# Patient Record
Sex: Female | Born: 1935 | Race: White | Hispanic: No | Smoking: Former smoker
Health system: Southern US, Community
[De-identification: ages and names within clinical notes are randomized; demographics above are authoritative.]

## PROBLEM LIST (undated history)

## (undated) DIAGNOSIS — I10 Essential (primary) hypertension: Secondary | ICD-10-CM

## (undated) DIAGNOSIS — G459 Transient cerebral ischemic attack, unspecified: Secondary | ICD-10-CM

## (undated) DIAGNOSIS — C9192 Lymphoid leukemia, unspecified, in relapse: Secondary | ICD-10-CM

## (undated) DIAGNOSIS — I4891 Unspecified atrial fibrillation: Secondary | ICD-10-CM

## (undated) DIAGNOSIS — R609 Edema, unspecified: Secondary | ICD-10-CM

## (undated) DIAGNOSIS — D4819 Other specified neoplasm of uncertain behavior of connective and other soft tissue: Secondary | ICD-10-CM

## (undated) DIAGNOSIS — F329 Major depressive disorder, single episode, unspecified: Secondary | ICD-10-CM

## (undated) DIAGNOSIS — E785 Hyperlipidemia, unspecified: Secondary | ICD-10-CM

## (undated) DIAGNOSIS — I517 Cardiomegaly: Secondary | ICD-10-CM

## (undated) DIAGNOSIS — D649 Anemia, unspecified: Secondary | ICD-10-CM

## (undated) DIAGNOSIS — I5032 Chronic diastolic (congestive) heart failure: Secondary | ICD-10-CM

## (undated) DIAGNOSIS — R040 Epistaxis: Secondary | ICD-10-CM

## (undated) DIAGNOSIS — D481 Neoplasm of uncertain behavior of connective and other soft tissue: Secondary | ICD-10-CM

## (undated) DIAGNOSIS — J90 Pleural effusion, not elsewhere classified: Secondary | ICD-10-CM

## (undated) DIAGNOSIS — I509 Heart failure, unspecified: Secondary | ICD-10-CM

## (undated) DIAGNOSIS — I110 Hypertensive heart disease with heart failure: Secondary | ICD-10-CM

## (undated) DIAGNOSIS — J9621 Acute and chronic respiratory failure with hypoxia: Secondary | ICD-10-CM

## (undated) DIAGNOSIS — M109 Gout, unspecified: Secondary | ICD-10-CM

## (undated) DIAGNOSIS — I639 Cerebral infarction, unspecified: Secondary | ICD-10-CM

## (undated) DIAGNOSIS — F39 Unspecified mood [affective] disorder: Secondary | ICD-10-CM

## (undated) HISTORY — DX: Epistaxis: R04.0

## (undated) HISTORY — DX: Chronic diastolic (congestive) heart failure: I50.32

## (undated) HISTORY — DX: Essential (primary) hypertension: I10

## (undated) HISTORY — PX: DILATION AND CURETTAGE OF UTERUS: SHX78

## (undated) HISTORY — DX: Lymphoid leukemia, unspecified, in relapse: C91.92

## (undated) HISTORY — DX: Gout, unspecified: M10.9

## (undated) HISTORY — DX: Hyperlipidemia, unspecified: E78.5

## (undated) HISTORY — PX: TOTAL KNEE ARTHROPLASTY: SHX125

## (undated) HISTORY — PX: APPENDECTOMY: SHX54

## (undated) HISTORY — PX: BREAST LUMPECTOMY: SHX2

## (undated) HISTORY — PX: CHOLECYSTECTOMY: SHX55

## (undated) HISTORY — DX: Unspecified mood (affective) disorder: F39

## (undated) HISTORY — DX: Cerebral infarction, unspecified: I63.9

## (undated) HISTORY — PX: CATARACT EXTRACTION BILATERAL W/ ANTERIOR VITRECTOMY: SHX1304

## (undated) HISTORY — DX: Neoplasm of uncertain behavior of connective and other soft tissue: D48.1

## (undated) HISTORY — PX: OTHER SURGICAL HISTORY: SHX169

## (undated) HISTORY — DX: Unspecified atrial fibrillation: I48.91

## (undated) HISTORY — PX: TONSILLECTOMY: SUR1361

## (undated) HISTORY — DX: Other specified neoplasm of uncertain behavior of connective and other soft tissue: D48.19

---

## 2004-11-26 ENCOUNTER — Ambulatory Visit: Payer: Self-pay | Admitting: Internal Medicine

## 2005-12-30 ENCOUNTER — Ambulatory Visit: Payer: Self-pay | Admitting: Internal Medicine

## 2006-06-03 ENCOUNTER — Other Ambulatory Visit: Payer: Self-pay

## 2006-06-03 ENCOUNTER — Emergency Department: Payer: Self-pay | Admitting: Emergency Medicine

## 2006-10-12 ENCOUNTER — Ambulatory Visit: Payer: Self-pay | Admitting: Urology

## 2006-10-12 ENCOUNTER — Other Ambulatory Visit: Payer: Self-pay

## 2006-10-26 ENCOUNTER — Ambulatory Visit: Payer: Self-pay | Admitting: Urology

## 2006-12-06 ENCOUNTER — Ambulatory Visit: Payer: Self-pay | Admitting: Internal Medicine

## 2006-12-22 ENCOUNTER — Ambulatory Visit: Payer: Self-pay | Admitting: Internal Medicine

## 2006-12-28 ENCOUNTER — Other Ambulatory Visit: Payer: Self-pay

## 2006-12-28 ENCOUNTER — Observation Stay: Payer: Self-pay | Admitting: Internal Medicine

## 2007-01-05 ENCOUNTER — Ambulatory Visit: Payer: Self-pay | Admitting: Internal Medicine

## 2007-01-06 ENCOUNTER — Ambulatory Visit: Payer: Self-pay | Admitting: Internal Medicine

## 2007-01-24 ENCOUNTER — Ambulatory Visit: Payer: Self-pay | Admitting: Internal Medicine

## 2007-02-05 ENCOUNTER — Ambulatory Visit: Payer: Self-pay | Admitting: Internal Medicine

## 2007-03-07 ENCOUNTER — Ambulatory Visit: Payer: Self-pay | Admitting: Internal Medicine

## 2007-03-30 ENCOUNTER — Ambulatory Visit: Payer: Self-pay | Admitting: Internal Medicine

## 2007-04-07 ENCOUNTER — Ambulatory Visit: Payer: Self-pay | Admitting: Internal Medicine

## 2007-06-07 ENCOUNTER — Ambulatory Visit: Payer: Self-pay | Admitting: Internal Medicine

## 2007-06-28 ENCOUNTER — Ambulatory Visit: Payer: Self-pay | Admitting: Internal Medicine

## 2007-07-08 ENCOUNTER — Ambulatory Visit: Payer: Self-pay | Admitting: Internal Medicine

## 2007-08-07 ENCOUNTER — Ambulatory Visit: Payer: Self-pay | Admitting: Internal Medicine

## 2007-09-07 ENCOUNTER — Ambulatory Visit: Payer: Self-pay | Admitting: Internal Medicine

## 2007-09-14 ENCOUNTER — Ambulatory Visit: Payer: Self-pay | Admitting: Internal Medicine

## 2007-10-08 ENCOUNTER — Ambulatory Visit: Payer: Self-pay | Admitting: Internal Medicine

## 2007-11-05 ENCOUNTER — Ambulatory Visit: Payer: Self-pay | Admitting: Internal Medicine

## 2007-12-06 ENCOUNTER — Ambulatory Visit: Payer: Self-pay | Admitting: Internal Medicine

## 2008-01-05 ENCOUNTER — Ambulatory Visit: Payer: Self-pay | Admitting: Internal Medicine

## 2008-02-05 ENCOUNTER — Ambulatory Visit: Payer: Self-pay | Admitting: Internal Medicine

## 2008-03-06 ENCOUNTER — Ambulatory Visit: Payer: Self-pay | Admitting: Internal Medicine

## 2008-04-05 ENCOUNTER — Ambulatory Visit: Payer: Self-pay | Admitting: Internal Medicine

## 2008-04-06 ENCOUNTER — Ambulatory Visit: Payer: Self-pay | Admitting: Internal Medicine

## 2008-04-11 ENCOUNTER — Ambulatory Visit: Payer: Self-pay | Admitting: Internal Medicine

## 2008-04-16 ENCOUNTER — Ambulatory Visit: Payer: Self-pay | Admitting: Internal Medicine

## 2008-04-25 ENCOUNTER — Ambulatory Visit: Payer: Self-pay | Admitting: Unknown Physician Specialty

## 2008-05-07 ENCOUNTER — Ambulatory Visit: Payer: Self-pay | Admitting: Internal Medicine

## 2008-06-06 ENCOUNTER — Ambulatory Visit: Payer: Self-pay | Admitting: Internal Medicine

## 2008-07-07 ENCOUNTER — Ambulatory Visit: Payer: Self-pay | Admitting: Internal Medicine

## 2008-07-08 ENCOUNTER — Ambulatory Visit: Payer: Self-pay | Admitting: Internal Medicine

## 2008-07-09 ENCOUNTER — Ambulatory Visit: Payer: Self-pay | Admitting: Internal Medicine

## 2008-07-18 ENCOUNTER — Ambulatory Visit: Payer: Self-pay | Admitting: Internal Medicine

## 2008-08-06 ENCOUNTER — Ambulatory Visit: Payer: Self-pay | Admitting: Internal Medicine

## 2008-09-06 ENCOUNTER — Ambulatory Visit: Payer: Self-pay | Admitting: Internal Medicine

## 2008-10-07 ENCOUNTER — Ambulatory Visit: Payer: Self-pay | Admitting: Internal Medicine

## 2008-10-09 ENCOUNTER — Ambulatory Visit: Payer: Self-pay | Admitting: Internal Medicine

## 2008-11-04 ENCOUNTER — Ambulatory Visit: Payer: Self-pay | Admitting: Internal Medicine

## 2009-01-04 ENCOUNTER — Ambulatory Visit: Payer: Self-pay | Admitting: Internal Medicine

## 2009-01-17 ENCOUNTER — Ambulatory Visit: Payer: Self-pay | Admitting: Internal Medicine

## 2009-01-21 ENCOUNTER — Ambulatory Visit: Payer: Self-pay | Admitting: Internal Medicine

## 2009-02-04 ENCOUNTER — Ambulatory Visit: Payer: Self-pay | Admitting: Internal Medicine

## 2009-03-18 ENCOUNTER — Ambulatory Visit: Payer: Self-pay | Admitting: Unknown Physician Specialty

## 2009-04-06 ENCOUNTER — Ambulatory Visit: Payer: Self-pay | Admitting: Internal Medicine

## 2009-04-24 ENCOUNTER — Ambulatory Visit: Payer: Self-pay | Admitting: Internal Medicine

## 2009-05-07 ENCOUNTER — Ambulatory Visit: Payer: Self-pay | Admitting: Internal Medicine

## 2009-07-07 ENCOUNTER — Ambulatory Visit: Payer: Self-pay | Admitting: Internal Medicine

## 2009-07-09 ENCOUNTER — Ambulatory Visit: Payer: Self-pay | Admitting: Internal Medicine

## 2009-07-11 ENCOUNTER — Ambulatory Visit: Payer: Self-pay | Admitting: Internal Medicine

## 2009-08-06 ENCOUNTER — Ambulatory Visit: Payer: Self-pay | Admitting: Internal Medicine

## 2009-11-04 ENCOUNTER — Ambulatory Visit: Payer: Self-pay | Admitting: Internal Medicine

## 2009-11-06 ENCOUNTER — Ambulatory Visit: Payer: Self-pay | Admitting: Internal Medicine

## 2009-12-05 ENCOUNTER — Ambulatory Visit: Payer: Self-pay | Admitting: Internal Medicine

## 2010-01-04 ENCOUNTER — Ambulatory Visit: Payer: Self-pay | Admitting: Internal Medicine

## 2010-01-07 ENCOUNTER — Ambulatory Visit: Payer: Self-pay | Admitting: Internal Medicine

## 2010-01-09 ENCOUNTER — Ambulatory Visit: Payer: Self-pay | Admitting: Internal Medicine

## 2010-02-03 ENCOUNTER — Ambulatory Visit: Payer: Self-pay | Admitting: Internal Medicine

## 2010-02-04 ENCOUNTER — Ambulatory Visit: Payer: Self-pay | Admitting: Internal Medicine

## 2010-07-15 ENCOUNTER — Ambulatory Visit: Payer: Self-pay | Admitting: Internal Medicine

## 2010-08-06 ENCOUNTER — Ambulatory Visit: Payer: Self-pay | Admitting: Internal Medicine

## 2011-01-11 ENCOUNTER — Ambulatory Visit: Payer: Self-pay | Admitting: Internal Medicine

## 2011-01-13 ENCOUNTER — Ambulatory Visit: Payer: Self-pay | Admitting: Internal Medicine

## 2011-01-21 ENCOUNTER — Ambulatory Visit: Payer: Self-pay

## 2011-02-05 ENCOUNTER — Ambulatory Visit: Payer: Self-pay | Admitting: Internal Medicine

## 2011-03-23 ENCOUNTER — Ambulatory Visit: Payer: Self-pay | Admitting: Anesthesiology

## 2011-04-02 ENCOUNTER — Ambulatory Visit: Payer: Self-pay | Admitting: General Practice

## 2011-07-02 ENCOUNTER — Inpatient Hospital Stay: Payer: Self-pay | Admitting: Internal Medicine

## 2011-07-16 ENCOUNTER — Ambulatory Visit: Payer: Self-pay | Admitting: Internal Medicine

## 2011-08-07 ENCOUNTER — Ambulatory Visit: Payer: Self-pay | Admitting: Internal Medicine

## 2012-01-14 ENCOUNTER — Ambulatory Visit: Payer: Self-pay | Admitting: Oncology

## 2012-01-14 LAB — CBC CANCER CENTER
Basophil #: 0.1 x10 3/mm (ref 0.0–0.1)
Basophil %: 1 %
HGB: 13.5 g/dL (ref 12.0–16.0)
Lymphocyte %: 24.2 %
MCH: 30.2 pg (ref 26.0–34.0)
MCV: 92 fL (ref 80–100)
Monocyte %: 9.3 %
Neutrophil %: 62.9 %
Platelet: 154 x10 3/mm (ref 150–440)
RBC: 4.46 10*6/uL (ref 3.80–5.20)
RDW: 14.6 % — ABNORMAL HIGH (ref 11.5–14.5)
WBC: 6.2 x10 3/mm (ref 3.6–11.0)

## 2012-01-14 LAB — COMPREHENSIVE METABOLIC PANEL
Alkaline Phosphatase: 78 U/L (ref 50–136)
Anion Gap: 11 (ref 7–16)
Bilirubin,Total: 0.4 mg/dL (ref 0.2–1.0)
Chloride: 103 mmol/L (ref 98–107)
Co2: 29 mmol/L (ref 21–32)
EGFR (Non-African Amer.): >60
Osmolality: 286 (ref 275–301)
SGPT (ALT): 41 U/L
Sodium: 143 mmol/L (ref 136–145)
Total Protein: 7 g/dL (ref 6.4–8.2)

## 2012-01-14 LAB — LACTATE DEHYDROGENASE: LDH: 126 U/L (ref 84–246)

## 2012-01-20 ENCOUNTER — Ambulatory Visit: Payer: Self-pay | Admitting: Oncology

## 2012-02-05 ENCOUNTER — Ambulatory Visit: Payer: Self-pay | Admitting: Oncology

## 2012-10-02 ENCOUNTER — Ambulatory Visit: Payer: Self-pay | Admitting: General Practice

## 2012-10-02 DIAGNOSIS — I10 Essential (primary) hypertension: Secondary | ICD-10-CM

## 2012-10-02 LAB — BASIC METABOLIC PANEL
Anion Gap: 9 (ref 7–16)
BUN: 14 mg/dL (ref 7–18)
Calcium, Total: 9.7 mg/dL (ref 8.5–10.1)
Chloride: 104 mmol/L (ref 98–107)
Co2: 27 mmol/L (ref 21–32)
EGFR (African American): >60
EGFR (Non-African Amer.): >60
Osmolality: 280 (ref 275–301)

## 2012-10-02 LAB — URINALYSIS, COMPLETE
Blood: NEGATIVE
Glucose,UR: NEGATIVE mg/dL (ref 0–75)
Nitrite: NEGATIVE
Ph: 6 (ref 4.5–8.0)
RBC,UR: 3 /HPF (ref 0–5)
Squamous Epithelial: 1
WBC UR: 15 /HPF (ref 0–5)

## 2012-10-02 LAB — PROTIME-INR
INR: 1
Prothrombin Time: 13.1 secs (ref 11.5–14.7)

## 2012-10-02 LAB — SEDIMENTATION RATE: Erythrocyte Sed Rate: 19 mm/hr (ref 0–30)

## 2012-10-02 LAB — CBC
HGB: 13.7 g/dL (ref 12.0–16.0)
MCV: 92 fL (ref 80–100)
Platelet: 166 10*3/uL (ref 150–440)
RBC: 4.43 10*6/uL (ref 3.80–5.20)
RDW: 14.5 % (ref 11.5–14.5)

## 2012-10-02 LAB — APTT: Activated PTT: 30.9 secs (ref 23.6–35.9)

## 2012-10-02 LAB — MRSA PCR SCREENING

## 2012-10-06 LAB — URINE CULTURE

## 2012-11-16 ENCOUNTER — Ambulatory Visit: Payer: Self-pay | Admitting: General Practice

## 2012-11-16 LAB — SEDIMENTATION RATE: Erythrocyte Sed Rate: 21 mm/hr (ref 0–30)

## 2012-11-16 LAB — URINALYSIS, COMPLETE
Bilirubin,UR: NEGATIVE
Blood: NEGATIVE
Glucose,UR: NEGATIVE mg/dL (ref 0–75)
Ph: 7 (ref 4.5–8.0)
Protein: NEGATIVE
Specific Gravity: 1.014 (ref 1.003–1.030)
Squamous Epithelial: 12
WBC UR: 5 /HPF (ref 0–5)

## 2012-11-16 LAB — CBC
HGB: 13.8 g/dL (ref 12.0–16.0)
MCV: 91 fL (ref 80–100)
Platelet: 174 10*3/uL (ref 150–440)
RBC: 4.52 10*6/uL (ref 3.80–5.20)
RDW: 14.8 % — ABNORMAL HIGH (ref 11.5–14.5)
WBC: 6.8 10*3/uL (ref 3.6–11.0)

## 2012-11-16 LAB — PROTIME-INR: Prothrombin Time: 13.8 secs (ref 11.5–14.7)

## 2012-11-16 LAB — BASIC METABOLIC PANEL
Calcium, Total: 9.5 mg/dL (ref 8.5–10.1)
Chloride: 103 mmol/L (ref 98–107)
Creatinine: 0.82 mg/dL (ref 0.60–1.30)
EGFR (African American): >60
EGFR (Non-African Amer.): >60
Glucose: 91 mg/dL (ref 65–99)
Osmolality: 278 (ref 275–301)
Potassium: 3.7 mmol/L (ref 3.5–5.1)

## 2012-11-16 LAB — APTT: Activated PTT: 31.5 secs (ref 23.6–35.9)

## 2012-11-17 LAB — URINE CULTURE

## 2012-11-29 ENCOUNTER — Inpatient Hospital Stay: Payer: Self-pay | Admitting: General Practice

## 2012-11-30 LAB — BASIC METABOLIC PANEL
BUN: 11 mg/dL (ref 7–18)
Co2: 28 mmol/L (ref 21–32)
Creatinine: 0.88 mg/dL (ref 0.60–1.30)
EGFR (African American): >60
EGFR (Non-African Amer.): 60 — ABNORMAL LOW
Osmolality: 274 (ref 275–301)
Potassium: 3.8 mmol/L (ref 3.5–5.1)

## 2012-11-30 LAB — HEMOGLOBIN: HGB: 11.6 g/dL — ABNORMAL LOW (ref 12.0–16.0)

## 2012-11-30 LAB — PLATELET COUNT: Platelet: 119 10*3/uL — ABNORMAL LOW (ref 150–440)

## 2012-12-01 LAB — BASIC METABOLIC PANEL
BUN: 13 mg/dL (ref 7–18)
Co2: 29 mmol/L (ref 21–32)
EGFR (African American): >60
EGFR (Non-African Amer.): >60
Glucose: 107 mg/dL — ABNORMAL HIGH (ref 65–99)
Osmolality: 273 (ref 275–301)
Potassium: 3.9 mmol/L (ref 3.5–5.1)

## 2012-12-01 LAB — HEMOGLOBIN: HGB: 11.1 g/dL — ABNORMAL LOW (ref 12.0–16.0)

## 2012-12-01 LAB — PLATELET COUNT: Platelet: 114 10*3/uL — ABNORMAL LOW (ref 150–440)

## 2012-12-05 DIAGNOSIS — I10 Essential (primary) hypertension: Secondary | ICD-10-CM

## 2012-12-05 DIAGNOSIS — IMO0002 Reserved for concepts with insufficient information to code with codable children: Secondary | ICD-10-CM

## 2012-12-05 DIAGNOSIS — M1710 Unilateral primary osteoarthritis, unspecified knee: Secondary | ICD-10-CM

## 2012-12-05 DIAGNOSIS — E785 Hyperlipidemia, unspecified: Secondary | ICD-10-CM

## 2012-12-05 DIAGNOSIS — M109 Gout, unspecified: Secondary | ICD-10-CM

## 2012-12-11 DIAGNOSIS — J209 Acute bronchitis, unspecified: Secondary | ICD-10-CM

## 2013-01-04 ENCOUNTER — Ambulatory Visit: Payer: Self-pay | Admitting: Oncology

## 2013-02-13 ENCOUNTER — Ambulatory Visit: Payer: Medicare Other | Admitting: Internal Medicine

## 2013-12-27 LAB — COMPREHENSIVE METABOLIC PANEL
ALBUMIN: 3.5 g/dL (ref 3.4–5.0)
ALK PHOS: 84 U/L
Anion Gap: 6 — ABNORMAL LOW (ref 7–16)
BUN: 12 mg/dL (ref 7–18)
Bilirubin,Total: 0.3 mg/dL (ref 0.2–1.0)
Calcium, Total: 9.1 mg/dL (ref 8.5–10.1)
Chloride: 105 mmol/L (ref 98–107)
Co2: 26 mmol/L (ref 21–32)
Creatinine: 0.78 mg/dL (ref 0.60–1.30)
Glucose: 178 mg/dL — ABNORMAL HIGH (ref 65–99)
Osmolality: 278 (ref 275–301)
POTASSIUM: 3.7 mmol/L (ref 3.5–5.1)
SGOT(AST): 37 U/L (ref 15–37)
SGPT (ALT): 37 U/L (ref 12–78)
Sodium: 137 mmol/L (ref 136–145)
Total Protein: 7 g/dL (ref 6.4–8.2)

## 2013-12-27 LAB — URINALYSIS, COMPLETE
BILIRUBIN, UR: NEGATIVE
Blood: NEGATIVE
GLUCOSE, UR: NEGATIVE mg/dL (ref 0–75)
Ketone: NEGATIVE
Nitrite: NEGATIVE
PROTEIN: NEGATIVE
Ph: 5 (ref 4.5–8.0)
RBC,UR: 1 /HPF (ref 0–5)
Specific Gravity: 1.006 (ref 1.003–1.030)
Squamous Epithelial: <1
WBC UR: 4 /HPF (ref 0–5)

## 2013-12-27 LAB — CBC WITH DIFFERENTIAL/PLATELET
Basophil #: 0.1 10*3/uL (ref 0.0–0.1)
Basophil %: 0.9 %
Eosinophil #: 0.1 10*3/uL (ref 0.0–0.7)
Eosinophil %: 1.6 %
HCT: 43.4 % (ref 35.0–47.0)
HGB: 14.4 g/dL (ref 12.0–16.0)
Lymphocyte #: 1.5 10*3/uL (ref 1.0–3.6)
Lymphocyte %: 18.1 %
MCH: 30.5 pg (ref 26.0–34.0)
MCHC: 33.1 g/dL (ref 32.0–36.0)
MCV: 92 fL (ref 80–100)
Monocyte #: 0.6 x10 3/mm (ref 0.2–0.9)
Monocyte %: 6.9 %
NEUTROS ABS: 6 10*3/uL (ref 1.4–6.5)
Neutrophil %: 72.5 %
PLATELETS: 156 10*3/uL (ref 150–440)
RBC: 4.71 10*6/uL (ref 3.80–5.20)
RDW: 14.9 % — AB (ref 11.5–14.5)
WBC: 8.3 10*3/uL (ref 3.6–11.0)

## 2013-12-27 LAB — TROPONIN I: TROPONIN-I: 0.12 ng/mL — AB

## 2013-12-27 LAB — PROTIME-INR
INR: 1
Prothrombin Time: 13.4 secs (ref 11.5–14.7)

## 2013-12-27 LAB — APTT: ACTIVATED PTT: 30.9 s (ref 23.6–35.9)

## 2013-12-27 LAB — PRO B NATRIURETIC PEPTIDE: B-TYPE NATIURETIC PEPTID: 398 pg/mL (ref 0–450)

## 2013-12-28 ENCOUNTER — Inpatient Hospital Stay: Payer: Self-pay | Admitting: Internal Medicine

## 2013-12-28 LAB — CK TOTAL AND CKMB (NOT AT ARMC)
CK, TOTAL: 52 U/L
CK, Total: 49 U/L
CK, Total: 52 U/L
CK-MB: 1.4 ng/mL (ref 0.5–3.6)
CK-MB: 2.1 ng/mL (ref 0.5–3.6)
CK-MB: 2.3 ng/mL (ref 0.5–3.6)

## 2013-12-28 LAB — APTT: ACTIVATED PTT: 104.1 s — AB (ref 23.6–35.9)

## 2013-12-28 LAB — TROPONIN I
Troponin-I: 0.16 ng/mL — ABNORMAL HIGH
Troponin-I: 0.15 ng/mL — ABNORMAL HIGH

## 2013-12-28 LAB — PLATELET COUNT: PLATELETS: 143 10*3/uL — AB (ref 150–440)

## 2013-12-29 DIAGNOSIS — R079 Chest pain, unspecified: Secondary | ICD-10-CM

## 2013-12-29 LAB — CBC WITH DIFFERENTIAL/PLATELET
Basophil #: 0 10*3/uL (ref 0.0–0.1)
Basophil %: 0.7 %
EOS PCT: 4 %
Eosinophil #: 0.2 10*3/uL (ref 0.0–0.7)
HCT: 37.6 % (ref 35.0–47.0)
HGB: 12.6 g/dL (ref 12.0–16.0)
LYMPHS PCT: 28.7 %
Lymphocyte #: 1.7 10*3/uL (ref 1.0–3.6)
MCH: 31.1 pg (ref 26.0–34.0)
MCHC: 33.5 g/dL (ref 32.0–36.0)
MCV: 93 fL (ref 80–100)
MONO ABS: 0.5 x10 3/mm (ref 0.2–0.9)
Monocyte %: 8.8 %
Neutrophil #: 3.4 10*3/uL (ref 1.4–6.5)
Neutrophil %: 57.8 %
Platelet: 126 10*3/uL — ABNORMAL LOW (ref 150–440)
RBC: 4.05 10*6/uL (ref 3.80–5.20)
RDW: 14.4 % (ref 11.5–14.5)
WBC: 5.9 10*3/uL (ref 3.6–11.0)

## 2013-12-29 LAB — BASIC METABOLIC PANEL
ANION GAP: 5 — AB (ref 7–16)
BUN: 12 mg/dL (ref 7–18)
CREATININE: 0.76 mg/dL (ref 0.60–1.30)
Calcium, Total: 9 mg/dL (ref 8.5–10.1)
Chloride: 109 mmol/L — ABNORMAL HIGH (ref 98–107)
Co2: 27 mmol/L (ref 21–32)
EGFR (African American): >60
EGFR (Non-African Amer.): >60
GLUCOSE: 110 mg/dL — AB (ref 65–99)
OSMOLALITY: 282 (ref 275–301)
POTASSIUM: 3.7 mmol/L (ref 3.5–5.1)
SODIUM: 141 mmol/L (ref 136–145)

## 2013-12-29 LAB — LIPID PANEL
CHOLESTEROL: 104 mg/dL (ref 0–200)
HDL: 29 mg/dL — AB (ref 40–60)
Ldl Cholesterol, Calc: 43 mg/dL (ref 0–100)
Triglycerides: 160 mg/dL (ref 0–200)
VLDL Cholesterol, Calc: 32 mg/dL (ref 5–40)

## 2013-12-29 LAB — MAGNESIUM: MAGNESIUM: 1.9 mg/dL

## 2013-12-29 LAB — APTT: ACTIVATED PTT: 36 s — AB (ref 23.6–35.9)

## 2014-01-04 ENCOUNTER — Ambulatory Visit: Payer: Self-pay | Admitting: Internal Medicine

## 2014-01-14 ENCOUNTER — Ambulatory Visit: Payer: Self-pay | Admitting: Internal Medicine

## 2014-01-14 LAB — CBC CANCER CENTER
Basophil #: 0.1 x10 3/mm (ref 0.0–0.1)
Basophil %: 0.9 %
EOS ABS: 0.2 x10 3/mm (ref 0.0–0.7)
Eosinophil %: 3.1 %
HCT: 46.2 % (ref 35.0–47.0)
HGB: 14.8 g/dL (ref 12.0–16.0)
Lymphocyte #: 2 x10 3/mm (ref 1.0–3.6)
Lymphocyte %: 24.9 %
MCH: 29.8 pg (ref 26.0–34.0)
MCHC: 32 g/dL (ref 32.0–36.0)
MCV: 93 fL (ref 80–100)
Monocyte #: 0.9 x10 3/mm (ref 0.2–0.9)
Monocyte %: 10.9 %
NEUTROS PCT: 60.2 %
Neutrophil #: 4.7 x10 3/mm (ref 1.4–6.5)
Platelet: 176 x10 3/mm (ref 150–440)
RBC: 4.96 10*6/uL (ref 3.80–5.20)
RDW: 14.8 % — AB (ref 11.5–14.5)
WBC: 7.9 x10 3/mm (ref 3.6–11.0)

## 2014-01-14 LAB — LACTATE DEHYDROGENASE: LDH: 155 U/L (ref 81–246)

## 2014-01-21 ENCOUNTER — Ambulatory Visit: Payer: Self-pay | Admitting: Internal Medicine

## 2014-02-04 ENCOUNTER — Ambulatory Visit: Payer: Self-pay | Admitting: Internal Medicine

## 2014-04-17 ENCOUNTER — Ambulatory Visit: Payer: Self-pay | Admitting: Internal Medicine

## 2014-04-17 LAB — CBC CANCER CENTER
BASOS ABS: 0.1 x10 3/mm (ref 0.0–0.1)
Basophil %: 1.2 %
Eosinophil #: 0.2 x10 3/mm (ref 0.0–0.7)
Eosinophil %: 2.8 %
HCT: 42.6 % (ref 35.0–47.0)
HGB: 13.7 g/dL (ref 12.0–16.0)
Lymphocyte #: 2 x10 3/mm (ref 1.0–3.6)
Lymphocyte %: 26.9 %
MCH: 30.1 pg (ref 26.0–34.0)
MCHC: 32.3 g/dL (ref 32.0–36.0)
MCV: 93 fL (ref 80–100)
MONOS PCT: 10.8 %
Monocyte #: 0.8 x10 3/mm (ref 0.2–0.9)
Neutrophil #: 4.5 x10 3/mm (ref 1.4–6.5)
Neutrophil %: 58.3 %
Platelet: 184 x10 3/mm (ref 150–440)
RBC: 4.56 10*6/uL (ref 3.80–5.20)
RDW: 15 % — ABNORMAL HIGH (ref 11.5–14.5)
WBC: 7.6 x10 3/mm (ref 3.6–11.0)

## 2014-04-17 LAB — LACTATE DEHYDROGENASE: LDH: 127 U/L (ref 81–246)

## 2014-05-07 ENCOUNTER — Ambulatory Visit: Payer: Self-pay | Admitting: Internal Medicine

## 2014-07-11 ENCOUNTER — Ambulatory Visit: Payer: Self-pay | Admitting: Internal Medicine

## 2014-07-11 LAB — CBC CANCER CENTER
BASOS ABS: 0.1 x10 3/mm (ref 0.0–0.1)
BASOS PCT: 1 %
EOS PCT: 2.3 %
Eosinophil #: 0.2 x10 3/mm (ref 0.0–0.7)
HCT: 43.2 % (ref 35.0–47.0)
HGB: 14.2 g/dL (ref 12.0–16.0)
LYMPHS ABS: 2 x10 3/mm (ref 1.0–3.6)
Lymphocyte %: 25.8 %
MCH: 30.1 pg (ref 26.0–34.0)
MCHC: 32.8 g/dL (ref 32.0–36.0)
MCV: 92 fL (ref 80–100)
MONO ABS: 0.7 x10 3/mm (ref 0.2–0.9)
Monocyte %: 9.4 %
NEUTROS ABS: 4.8 x10 3/mm (ref 1.4–6.5)
Neutrophil %: 61.5 %
Platelet: 169 x10 3/mm (ref 150–440)
RBC: 4.7 10*6/uL (ref 3.80–5.20)
RDW: 14.6 % — ABNORMAL HIGH (ref 11.5–14.5)
WBC: 7.8 x10 3/mm (ref 3.6–11.0)

## 2014-07-11 LAB — CREATININE, SERUM: Creatinine: 0.87 mg/dL (ref 0.60–1.30)

## 2014-07-11 LAB — LACTATE DEHYDROGENASE: LDH: 214 U/L (ref 81–246)

## 2014-08-06 ENCOUNTER — Ambulatory Visit: Payer: Self-pay | Admitting: Internal Medicine

## 2014-10-11 ENCOUNTER — Ambulatory Visit: Payer: Self-pay | Admitting: Internal Medicine

## 2014-10-11 LAB — CBC CANCER CENTER
Basophil #: 0.1 x10 3/mm (ref 0.0–0.1)
Basophil %: 1.3 %
Eosinophil #: 0.2 x10 3/mm (ref 0.0–0.7)
Eosinophil %: 2.7 %
HCT: 39.7 % (ref 35.0–47.0)
HGB: 13.1 g/dL (ref 12.0–16.0)
LYMPHS ABS: 1.8 x10 3/mm (ref 1.0–3.6)
Lymphocyte %: 26.4 %
MCH: 29.4 pg (ref 26.0–34.0)
MCHC: 33 g/dL (ref 32.0–36.0)
MCV: 89 fL (ref 80–100)
MONO ABS: 0.5 x10 3/mm (ref 0.2–0.9)
Monocyte %: 7.3 %
Neutrophil #: 4.2 x10 3/mm (ref 1.4–6.5)
Neutrophil %: 62.3 %
PLATELETS: 190 x10 3/mm (ref 150–440)
RBC: 4.46 10*6/uL (ref 3.80–5.20)
RDW: 15.1 % — AB (ref 11.5–14.5)
WBC: 6.8 x10 3/mm (ref 3.6–11.0)

## 2014-10-11 LAB — CREATININE, SERUM
CREATINE, SERUM: 0.84
Creatinine: 0.84 mg/dL (ref 0.60–1.30)
EGFR (African American): >60
EGFR (Non-African Amer.): >60

## 2014-10-11 LAB — IRON AND TIBC
Iron Bind.Cap.(Total): 462 ug/dL — ABNORMAL HIGH (ref 250–450)
Iron Saturation: 19 %
Iron: 90 ug/dL (ref 50–170)
Unbound Iron-Bind.Cap.: 372 ug/dL

## 2014-10-11 LAB — FERRITIN: Ferritin (ARMC): 52 ng/mL (ref 8–388)

## 2014-10-11 LAB — LACTATE DEHYDROGENASE: LDH: 138 U/L (ref 81–246)

## 2014-11-05 ENCOUNTER — Ambulatory Visit
Admit: 2014-11-05 | Disposition: A | Discharge status: Home / Self Care | Payer: Self-pay | Attending: Internal Medicine | Admitting: Internal Medicine

## 2014-12-06 ENCOUNTER — Ambulatory Visit
Admit: 2014-12-06 | Disposition: A | Discharge status: Home / Self Care | Payer: Self-pay | Attending: Physician Assistant | Admitting: Physician Assistant

## 2014-12-27 NOTE — Op Note (Signed)
PATIENT NAME:  Victoria Contreras, Victoria Contreras MR#:  [id number] DATE OF BIRTH:  [birth year]  DATE OF PROCEDURE:  11/29/2012  PREOPERATIVE DIAGNOSIS: Degenerative arthrosis of the right knee.   POSTOPERATIVE DIAGNOSIS: Degenerative arthrosis of the right knee.   PROCEDURE PERFORMED: Right total knee arthroplasty using computer-assisted navigation.   SURGEON: Laurice Record. Hooten, MD   ASSISTANT: Vance Peper, PA  ANESTHESIA: General and femoral nerve block.   ESTIMATED BLOOD LOSS: 50 mL.   FLUIDS REPLACED: 1800 mL of crystalloid.   TOURNIQUET TIME: 87 minutes.   DRAINS: Two medium drains to reinfusion system.   SOFT TISSUE RELEASES: Anterior cruciate ligament, posterior cruciate ligament, deep medial collateral ligament, and patellofemoral ligament.   IMPLANTS UTILIZED: DePuy PFC Sigma size 4N (narrow) posterior stabilized femoral component (cemented), size 3 MBT tibial component (cemented), 32 mm 3-peg oval dome patella (cemented), and a 10 mm stabilized rotating platform polyethylene insert.   INDICATIONS FOR SURGERY: The patient is an 81 year old female who has been seen for complaints of progressive right knee pain. X-rays demonstrated degenerative changes with relative valgus deformity. The patient had not seen significant improvement despite conservative nonsurgical intervention. After discussion of the risks and benefits of surgical intervention, the patient expressed understanding of the risks and benefits and agreed with plans for surgical intervention.   PROCEDURE IN DETAIL: The patient was brought into the operating room and, after adequate femoral nerve block and general anesthesia was achieved, a tourniquet was placed on the patient's upper right thigh. The patient's right knee and leg were cleaned and prepped with alcohol and DuraPrep and draped in the usual sterile fashion. A "timeout" was performed as per usual protocol. The right lower extremity was exsanguinated using an Esmarch, and the  tourniquet was inflated to 300 mmHg. An anterior longitudinal incision was made followed by a standard mid vastus approach. Moderate effusion was evacuated. Deep fibers of the medial collateral ligament were elevated in a subperiosteal fashion off the medial flare of the tibia so as to maintain a continuous soft tissue sleeve. The patella was subluxed laterally and the patellofemoral ligament was incised. Inspection of the knee demonstrated severe degenerative changes, most notably to the lateral compartment. Prominent osteophytes were debrided using a rongeur. Anterior and posterior cruciate ligaments were excised. Two 4.0 mm Schanz pins were inserted into the femur and into the tibia for attachment of the ray of trackers used for computer-assisted navigation. Hip center was identified using a circumduction technique. Distal landmarks were mapped using the computer. The distal femur and proximal tibia were mapped using the computer. Distal femoral cutting guide was positioned using computer-assisted navigation so as to achieve a 5 degree distal valgus cut. Cut was performed and verified using the computer. Distal femur was sized and it was felt that a size 4N (narrow) femoral component was appropriate. A size 4 cutting guide was positioned and the anterior cut was performed and verified using the computer. This was followed by completion of the posterior and chamfer cuts. Femoral cutting guide for the central box was then positioned and the central box cut was performed.   Attention was then directed to the proximal tibia. Medial and lateral menisci were excised. The extramedullary tibial cutting guide was positioned using computer-assisted navigation so as to achieve 0 degree varus valgus alignment and 0 degree posterior slope. Cut was performed and verified using the computer. The proximal tibia was sized and it was felt that a size 3 tibial tray was appropriate. Tibial and femoral trials were inserted  followed  by insertion of a 10 mm polyethylene insert. Excellent mediolateral soft tissue balancing was appreciated both in extension and in flexion. Finally, the patella was cut and prepared so as to accommodate a 32 mm 3-peg oval dome patella. Patellar trial was placed and the knee was placed through a range of motion. Excellent patellar tracking was appreciated. Femoral trial was removed after debridement of posterior osteophytes. A central post hole for the tibial component was reamed followed by insertion of a keel punch. Tibial trials were then removed. Cut surfaces of bone were irrigated with copious amounts of normal saline with antibiotic solution using pulsatile lavage and then suctioned dry. Polymethyl methacrylate cement with gentamicin was prepared in the usual fashion using a vacuum mixer. Cement was applied to the cut surface of the proximal tibia as well as along the undersurface of a size 3 MBT tibial component. Tibial component was positioned and impacted into place. Excess cement was removed using freer elevators. Cement was then applied to the cut surface of the femur as well as along the posterior flanges of a size 4N (narrowing) posterior stabilized femoral component. Femoral component was positioned and impacted into place. Excess cement was removed using freer elevators. A 10 mm polyethylene trial was inserted and the knee was brought in full extension with steady axial compression applied. Finally, cement was applied to the backside of a 32 mm 3-peg oval dome patella and the patellar component was positioned and patellar clamp applied. Excess cement was removed using freer elevators.   After adequate curing of the cement, the tourniquet was deflated after total tourniquet time of 87 minutes. Hemostasis was achieved using electrocautery. The knee was irrigated with copious amounts of normal saline with antibiotic solution using pulsatile lavage and then suctioned dry. The knee was inspected for any  residual cement debris. 30 mL of 0.25% Marcaine with epinephrine was injected along the posterior capsule. A 10 mm stabilized rotating platform polyethylene insert was inserted and the knee was placed through a range of motion. Excellent patellar tracking was appreciated and excellent mediolateral soft tissue balancing was appreciated both in extension and in flexion. Two medium drains were placed in the wound bed and brought out through a separate stab incision to be attached to a reinfusion system. The medial parapatellar portion of the incision was reapproximated using interrupted sutures of #1 Vicryl. The subcutaneous tissue was approximated in layers using first #0 Vicryl followed by #2-0 Vicryl. Skin was closed with skin staples. A sterile dressing was applied.   The patient tolerated the procedure well. She was transferred to the recovery room in stable condition.  ____________________________ Laurice Record. Holley Bouche., MD jph:sb D: 11/29/2012 11:06:33 ET T: 11/29/2012 11:25:14 ET JOB#: [id number]  cc: Jeneen Rinks P. Holley Bouche., MD, <Dictator> Laurice Record Holley Bouche MD ELECTRONICALLY SIGNED 12/02/2012 12:29

## 2014-12-27 NOTE — Discharge Summary (Signed)
PATIENT NAME:  Victoria Contreras, Victoria Contreras MR#:  [id number] DATE OF BIRTH:  Aug 13, 1925  DATE OF ADMISSION:  11/29/2012 DATE OF DISCHARGE:  12/02/2012  ADMITTING DIAGNOSIS: Degenerative arthrosis of right knee.   DISCHARGE DIAGNOSIS: Degenerative arthrosis of right knee.   HISTORY: The patient is a pleasant 81 year old white female who has been followed at [facility] for progression of right knee pain. She has had a long history of right knee pain. The pain was noted to be aggravated with weight-bearing activities. At the time of surgery, she had gone to using a cane or walker for ambulation due to the severity of discomfort. She reported some giving way of the knee, but denied any gross locking or swelling. She has not seen any significant improvement in her condition despite antiinflammatory medications. The last series of Synvisc injections did not provide any significant relief of her symptoms. She states that the pain had increased to the point that it was significantly interfering with her activities of daily living. X-rays taken in [place] showed narrowing of the medial cartilage space with associated valgus alignment. She was noted to have osteophyte as well as subchondral sclerosis. After discussion of the risks and benefits of surgical intervention, the patient expressed her understanding of the risks and benefits and agreed with plans for surgical intervention.   PROCEDURE: Right total knee arthroplasty using computer-assisted navigation.   ANESTHESIA: Femoral nerve block with general.   SOFT TISSUE RELEASES: Anterior cruciate ligament, posterior cruciate ligament, deep medial collateral ligaments, as well as the patellofemoral ligament.   IMPLANTS UTILIZED: DePuy PFC Sigma size 4 narrow posterior stabilized femoral component (cemented), size 3 MBT tibial component (cemented), 32 mm 3-pegged oval dome patella (cemented), and a 10 mm stabilized rotating platform polyethylene  insert.   HOSPITAL COURSE: The patient tolerated the procedure very well. She had no complications. She was then taken to PAC-U where she was stabilized and then transferred to the orthopedic floor. The patient began receiving anticoagulation therapy of Lovenox 30 mg sub-Q every 12 hours per anesthesia and pharmacy protocol. She was fitted with TED stockings bilaterally. These were allowed to be removed 1 hour per 8 hour shift. She was also fitted with the AV-I compression foot pumps bilaterally set at 80 mmHg. Her calves have been nontender. There has been no evidence of any DVTs to the lower extremity. Heels were elevated off the bed using rolled towels.   The patient has denied any chest pain or shortness of breath. Vital signs have been stable. She has been afebrile. Hemodynamically she was stable and no transfusions were given other than the Autovac transfusion given the first 6 hours.   Physical therapy was initiated on day 1 for gait training and transfers. She has done very well with range of motion, but has been having some problems with ambulation. Occupational therapy was also initiated on day 1 for ADL and assistive devices.   The patient's IV, Foley and Hemovac were discontinued on day 2 along with a dressing change. The wound was free of any drainage or signs of infection. The Polar Care was reapplied to the surgical leg maintaining a temperature of 40 to 50 degrees Fahrenheit.   DISPOSITION: The patient is being discharged to skilled nursing facility in improved stable condition.   DISCHARGE INSTRUCTIONS:  1.  She may weight bear as tolerated. Continue using a walker until cleared by physical therapy to go to a quad cane. She will receive physical therapy for gait training and  transfers and OT for ADL and assistive devices.  2.  Continue TED stockings. These are to be worn around the clock. These may be removed 1 hour per 8 hour shift.  3.  Continue Polar Care maintaining a temperature  of 40 to 50 degrees Fahrenheit. 4.  Placed on a regular diet.  5.  Change dressing as needed.  6.  Elevate heels off the bed.  7.  Incentive spirometer q. 1 hour while awake.  8.  Encourage cough and deep breathing q. 2 hours while awake.   ALLERGIES: ADHESIVES.  MEDICATIONS: 1.  Potassium chloride ER 20 milliequivalents b.i.d.  2.  Zocor 20 mg at bedtime.  3.  Lovenox 30 mg sub-Q every 12 hours for 14 days then discontinue and begin taking  one 81 mg enteric-coated aspirin.  4.  Allopurinol 300 mg daily. 5.  Senokot-S 1 tablet b.i.d.  6.  Hydrochlorothiazide 25 mg daily. 7.  Metoprolol 50 mg b.i.d.  8.  Multivitamin tablet 1 tablet per day.  9.  Pantoprazole 40 mg b.i.d.  10.  Tylenol ES 500 to 1000 mg q. 4 hours p.r.n. for pain and temperature greater than 100.4. 11.  Acyclovir 400 mg use your schedule every day at 8:00 a.m. p.r.n. for viral lesion. 12.  Mylanta DS 30 mL q. 6 hours p.r.n.  13.  Dulcolax suppositories 10 mg rectally daily p.r.n. for constipation. 14.  Oxycodone 5 to 10 mg q. 4 to 6 hours p.r.n. for pain.  15.  Milk of magnesia 30 mL b.i.d. p.r.n.  16.  Tramadol 50 to 100 mg q. 4 to 6 hours p.r.n. for pain.  17.  Enema soapsuds if no results with milk of magnesia or Dulcolax.   PAST MEDICAL HISTORY: 1.  Chickenpox.  2.  Hypertension. 3.  Stroke in 1989. 4.  CLL cancer in 2008. 5.  Tumor between kidney and colon cancer, in 2009.  6.  Cataract.  7.  Hyperlipidemia.  8.  Gout. ____________________________ Vance Peper, PA jrw:sb D: 12/01/2012 12:29:00 ET T: 12/01/2012 12:54:02 ET JOB#: [id number]  cc: Vance Peper, PA, <Dictator> Osmond Steckman PA ELECTRONICALLY SIGNED 12/02/2012 8:02

## 2014-12-28 NOTE — H&P (Signed)
PATIENT NAME:  Victoria Contreras, Victoria Contreras MR#:  [id number] DATE OF BIRTH:  05-02-1925  DATE OF ADMISSION:  12/28/2013  FAMILY CARE PHYSICIAN:  Dr. Lisette Grinder.   REFERRING PHYSICIAN:  Dr. Benjaman Lobe.    CHIEF COMPLAINT:  Palpitations, weakness and chest discomfort.   HISTORY OF PRESENT ILLNESS: The patient is an 81 year old pleasant Caucasian female who is residing at [facility], which is independent living facility, with a past medical history of paroxysmal atrial fibrillation on aspirin, hypertension, gout, chronic lymphocytic leukemia in remission, history of stroke with residual left-sided weakness, hyperlipidemia,  is presenting to the ER with a chief complaint of generalized weakness associated with palpitations and chest discomfort. The patient is reporting that she was doing fine until the afternoon. At around 1:00 p.m. yesterday, she started feeling weak, associated with cold and clamminess. She felt tired and, subsequently, at around 6:30 p.m., she started having palpitations. She felt like heart rate was racing up associated with chest discomfort. Denies any dizziness or loss of consciousness. The patient took metoprolol at around 6:30 p.m., but it did not relieve any of her symptoms. The patient called her daughter, who was returning from [place] after her vacation. She had suggested her mom call RN at [facility]. Mom has called [facility], who has called EMS who has found in atrial fibrillation with RVR, and she was brought into the ER. The patient's initial heart rate was at 135. She has received one dose of Cardizem IV. Subsequently, her heart rate slowed down to 111. The patient's initial set of cardiac enzymes has revealed a troponin of 0.12. She was diagnosed with non-STEMI and started on heparin drip by the ER physician. Recent white count is normal and heart rate slowed down to 50s to 60s after starting heparin drip. The patient denies any discomfort during my examination. Chest discomfort is  resolved, palpitations are resolved and resting comfortably. Daughter is at bedside. No other complaints. The patient denies any fever or fatigue.   PAST MEDICAL HISTORY: Hypertension, history of stroke with history of right-sided weakness, hyperlipidemia, hyperuricemia, chronic lymphocytic leukemia under remission, chronic atrial fibrillation, takes only baby aspirin.   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   ALLERGIES: ADHESIVE TAPE.   PSYCHOSOCIAL HISTORY: Lives at [facility], which is an independent living facility. No history of smoking, alcohol or illicit drugs. She is quite independent and takes care of her ADLS. Still drives.    FAMILY HISTORY: Father died from brain hemorrhage. The patient's mom was killed in a motor vehicle accident. Family history of hypertension, diabetes and migraines.   HOME MEDICATIONS: Tramadol 50 mg 1 to 2 tablets p.o. q.4 hours as needed, simvastatin 20 mg 1 tablet once daily, potassium chloride 20 mg p.o. 2 times a day, pantoprazole 40 mg 2 times a day, multivitamin once daily, metoprolol 50 mg 2 times a day,  magnesium hydroxide 30 mL p.o. 2 times a day, hydrochlorothiazide 25 mg 1 tablet once a day, allopurinol 300 mg 1 tablet p.o. once daily, Tylenol 500 mg p.o. q.4 hours as needed.   REVIEW OF SYSTEMS:  CONSTITUTIONAL: Denies any fever. Complaining of fatigue and weakness, which is resolved during my examination.  EYES: Denies blurry vision, double vision, glaucoma.  ENT: Denies epistaxis, discharge, snoring, postnasal drip.  RESPIRATION:  Denies cough, COPD, hemoptysis.  CARDIOVASCULAR: Some chest discomfort when her heart was racing up, but denies orthopnea, edema. Palpitations are  resolved. GASTROINTESTINAL: Denies nausea, vomiting, diarrhea, abdominal pain, hematemesis.  GENITOURINARY: No dysuria or hematuria.  GYNECOLOGIC  AND BREASTS:  Denies breast mass or vaginal discharge.  ENDOCRINE: Denies polyuria, nocturia, thyroid problems.  HEMATOLOGIC AND LYMPHATIC: Has chronic lymphocytic leukemia, under remission. No anemia, easy bruising, bleeding.  INTEGUMENTARY: No acne, rashes.  MUSCULOSKELETAL: No joint pain in the neck and has history of gout, on allopurinol.  NEUROLOGIC: Denies vertigo, ataxia. Has had history of stroke with residual weakness.  PSYCHIATRIC: No ADD, insomnia or OCD.   PHYSICAL EXAMINATION: VITAL SIGNS: Temperature 98 degrees Fahrenheit, pulse currently is at 54, respirations 20. Blood pressure 110/54, pulse oximetry 94% on 2 liters.   GENERAL APPEARANCE: Not in acute distress. Moderately built and nourished.  HEENT: Normocephalic, atraumatic. Pupils are equally reacting to light and accommodation. No scleral icterus. No conjunctival injection. No sinus tenderness. No postnasal drip. Moist mucous membranes.  NECK: Supple. No JVD. No thyromegaly. Range of motion is intact.  LUNGS: Clear to auscultation bilaterally. No accessory muscle use and no anterior chest wall tenderness on palpation.  CARDIOVASCULAR: Irregularly irregular. No murmurs.  GASTROINTESTINAL: Soft. Bowel sounds are positive in all four quadrants. Nontender, nondistended. No hepatosplenomegaly. No masses felt. No peripheral edema.  NEUROLOGIC: Negative, oriented x 3. Motor and sensory grossly intact. Reflexes are 2+ with some residual weakness.  EXTREMITIES: No edema. No cyanosis. No clubbing.  SKIN: Warm to touch. Normal turgor. No rashes. No lesions.  MUSCULOSKELETAL: No joint effusion, tenderness, erythema.  PSYCHIATRIC: Normal mood and affect.   LABORATORIES AND IMAGING STUDIES:  Urinalysis:  Trace leukocyte esterase 3+, nitrite is negative, yellow in color, clear in appearance.  PT-INR and PTT are normal. A CBC is normal. Troponin 0.12. LFTs are normal. Accu-Chek 178. Chem-8 is normal, except serum glucose at 178 and anion  gap at 6. Chest x-ray is not done, will go ahead and order chest x-ray. A 12-lead EKG has revealed atrial fibrillation with RVR at 129 beats per minute, right bundle branch block, nonspecific ST-T wave changes.   ASSESSMENT AND PLAN: An 81 year old pleasant Caucasian female presenting to the ER with a chief complaint of palpitations associated with weakness, tiredness and chest discomfort. Will be admitted with the following assessment and plan:  1.  Acute on chronic atrial fibrillation with rapid ventricular response, associated with elevated troponin, probably from demand ischemia from atrial fibrillation with rapid ventricular response. Will admit her to telemetry. We will provide her beta blocker with holding parameters for bradycardia.  2.  Heparin drip was started in the ER, in view of possible non-ST segment elevation myocardial infarction. Will continue that, while cycling cardiac biomarkers.  3.  Cardiology consult is placed to [facility].  4. Chronic lymphocytic leukemia, in remission.  The patient needs to be followed up by oncologist as recommended for further evaluation. 5.  Hypertension. Continue metoprolol with holding parameters for bradycardia. We will resume her hydrochlorothiazide.  6.  History of gout. Continue allopurinol, which is her home medication.  7.  Elevated at blood sugar. We will check hemoglobin A1c. The patient does not have any history of diabetes mellitus.  8.  We will provide her gastrointestinal prophylaxis.  9.  Deep vein thrombosis prophylaxis is not needed, as the patient is on heparin drip. We will give her baby aspirin.  10.  She is full code. Daughter is the medical power of attorney.  11.  The diagnosis and plan of care was discussed in detail with the patient and her  daughter at bedside. They both verbalized understanding of the plan.  12.  We will transfer the patient to Dr. Lisette Grinder in the morning.   Total time spent on admission is 50  minutes.    ____________________________ Nicholes Mango, MD ag:aj D: 12/28/2013 01:42:24 ET T: 12/28/2013 05:49:49 ET JOB#: [id number]  cc: Nicholes Mango, MD, <Dictator> John B. Sarina Ser, MD [facility]  Nicholes Mango MD ELECTRONICALLY SIGNED 01/09/2014 7:45

## 2014-12-28 NOTE — Discharge Summary (Signed)
PATIENT NAME:  Victoria Contreras, Victoria Contreras MR#:  [id number] DATE OF BIRTH:  1925/02/17  DATE OF ADMISSION:  12/28/2013 DATE OF DISCHARGE:  12/30/2013  DIAGNOSES AT THE TIME OF DISCHARGE:  1. Atrial fibrillation with rapid ventricular response.  2. Elevated troponin.  3. History of chronic lymphocytic leukemia. 4. History of gout.  5. History of previous stroke.   CHIEF COMPLAINT: Palpitations, weakness, and chest discomfort.   HISTORY OF PRESENT ILLNESS: Victoria Contreras is an 81 year old female who lives in [facility] independent living facility with a past medical history of atrial fibrillation, gout, chronic lymphatic leukemia, history of previous stroke with left-sided weakness, and hyperlipidemia, who presented to the ER complaining of generalized weakness associated with palpitations and discomfort. She was noted to be in atrial fibrillation with RVR, heart rate initially was 135. With IV Cardizem, the heart rate subsequently was down to 111 and later converted to sinus rhythm.   PAST MEDICAL HISTORY: Significant for hypertension, previous CVA, history of right-sided weakness, hyperlipidemia, hyperuricemia, CLL, chronic atrial fibrillation.   PHYSICAL EXAMINATION:  VITAL SIGNS: Temperature 98, respirations 20, blood pressure 110/54, oxygen saturation 94% on 2 liters.  GENERAL: She was not in distress.  HEENT: NCAT.  HEART: S1, S2 with irregular rhythm.  ABDOMEN: Soft, nontender.  EXTREMITIES: No edema.  NEUROLOGIC: Nonfocal.   LABORATORY DATA: Troponin was initially 0.12 and subsequent it went up to 0.15.   Initial 12-lead EKG showed atrial fibrillation with heart rate of 129. The patient subsequently reverted to sinus rhythm.   She did have some episodes of diarrhea that later resolved. She was ambulated. She was seen by cardiology and started on Xarelto. The patient appeared to tolerate this well and was stable at the time of discharge.   MEDICATIONS: The patient was discharged home in  following medications: Hydrochlorothiazide 25 mg a day, metoprolol 250 mg b.i.d., CoQ10 at 100 mg 1 capsule once a day, KCl 20 mEq b.i.d., tramadol 50 mg q. 4-6 hours p.r.n., Mag-Ox 30 mL b.i.d. as needed, simvastatin 20 mg once a day, Bisacody suppositories 10 mg as needed, pantoprazole 40 mg b.i.d., multivitamin 1 tablet a day, rivaroxaban 20 mg once a day, aspirin 81 mg a day, and acyclovir as needed.   FOLLOWUP: The patient has been advised to follow up with Dr. Gilford Rile and also follow up with Dr. Ubaldo Glassing, cardiologist, in 1-2 weeks' time.   TOTAL TIME SPENT DISCHARGING THE PATIENT: 35 minutes.     ____________________________ Tracie Harrier, MD vh:lt D: 12/30/2013 12:13:34 ET T: 12/30/2013 23:17:10 ET JOB#: [id number]  cc: Tracie Harrier, MD, <Dictator> Tracie Harrier MD ELECTRONICALLY SIGNED 01/22/2014 13:25

## 2014-12-28 NOTE — Consult Note (Signed)
Brief Consult Note: Diagnosis: afib with rvr.   Patient was seen by consultant.   Orders entered.   Comments: 81 yo with history of cva now admitted with new onset afib. Rate is better controlled. CHADSS is 4. Will add xarelto and follow over night. Will discontinue iv fluids and heparin. Will see in am and consider discharge.  Electronic Signatures: Teodoro Spray (MD)  (Signed 24-Apr-15 16:58)  Authored: Brief Consult Note   Last Updated: 24-Apr-15 16:58 by Teodoro Spray (MD)

## 2015-01-10 ENCOUNTER — Encounter: Payer: Medicare Other | Admitting: Internal Medicine

## 2015-01-10 ENCOUNTER — Other Ambulatory Visit: Payer: Medicare Other

## 2015-01-14 NOTE — Progress Notes (Signed)
This encounter was created in error - please disregard.

## 2015-04-10 ENCOUNTER — Ambulatory Visit: Payer: Medicare Other | Admitting: Family Medicine

## 2015-04-17 ENCOUNTER — Encounter: Payer: Self-pay | Admitting: Family Medicine

## 2015-04-17 ENCOUNTER — Inpatient Hospital Stay: Payer: Medicare Other

## 2015-04-17 ENCOUNTER — Inpatient Hospital Stay: Payer: Medicare Other | Attending: Family Medicine | Admitting: Family Medicine

## 2015-04-17 VITALS — BP 125/65 | HR 65 | Temp 98.6°F | Wt 200.1 lb

## 2015-04-17 DIAGNOSIS — I10 Essential (primary) hypertension: Secondary | ICD-10-CM

## 2015-04-17 DIAGNOSIS — Z8673 Personal history of transient ischemic attack (TIA), and cerebral infarction without residual deficits: Secondary | ICD-10-CM | POA: Diagnosis not present

## 2015-04-17 DIAGNOSIS — Z8639 Personal history of other endocrine, nutritional and metabolic disease: Secondary | ICD-10-CM | POA: Diagnosis not present

## 2015-04-17 DIAGNOSIS — C9192 Lymphoid leukemia, unspecified, in relapse: Secondary | ICD-10-CM

## 2015-04-17 DIAGNOSIS — Z8603 Personal history of neoplasm of uncertain behavior: Secondary | ICD-10-CM | POA: Diagnosis not present

## 2015-04-17 DIAGNOSIS — Z9111 Patient's noncompliance with dietary regimen: Secondary | ICD-10-CM

## 2015-04-17 DIAGNOSIS — Z87891 Personal history of nicotine dependence: Secondary | ICD-10-CM | POA: Diagnosis not present

## 2015-04-17 DIAGNOSIS — I4891 Unspecified atrial fibrillation: Secondary | ICD-10-CM | POA: Insufficient documentation

## 2015-04-17 DIAGNOSIS — R5383 Other fatigue: Secondary | ICD-10-CM | POA: Diagnosis not present

## 2015-04-17 DIAGNOSIS — C9110 Chronic lymphocytic leukemia of B-cell type not having achieved remission: Secondary | ICD-10-CM | POA: Insufficient documentation

## 2015-04-17 DIAGNOSIS — C9112 Chronic lymphocytic leukemia of B-cell type in relapse: Secondary | ICD-10-CM

## 2015-04-17 DIAGNOSIS — D481 Neoplasm of uncertain behavior of connective and other soft tissue: Secondary | ICD-10-CM

## 2015-04-17 DIAGNOSIS — C9111 Chronic lymphocytic leukemia of B-cell type in remission: Secondary | ICD-10-CM | POA: Diagnosis present

## 2015-04-17 DIAGNOSIS — Z79899 Other long term (current) drug therapy: Secondary | ICD-10-CM | POA: Insufficient documentation

## 2015-04-17 DIAGNOSIS — Z9221 Personal history of antineoplastic chemotherapy: Secondary | ICD-10-CM | POA: Diagnosis not present

## 2015-04-17 DIAGNOSIS — R531 Weakness: Secondary | ICD-10-CM | POA: Diagnosis not present

## 2015-04-17 DIAGNOSIS — R918 Other nonspecific abnormal finding of lung field: Secondary | ICD-10-CM

## 2015-04-17 DIAGNOSIS — Z87440 Personal history of urinary (tract) infections: Secondary | ICD-10-CM

## 2015-04-17 HISTORY — DX: Chronic lymphocytic leukemia of B-cell type in relapse: C91.12

## 2015-04-17 LAB — LACTATE DEHYDROGENASE: LDH: 120 U/L (ref 98–192)

## 2015-04-17 LAB — BASIC METABOLIC PANEL
ANION GAP: 5 (ref 5–15)
BUN: 14 mg/dL (ref 6–20)
CO2: 30 mmol/L (ref 22–32)
Calcium: 9.1 mg/dL (ref 8.9–10.3)
Chloride: 105 mmol/L (ref 101–111)
Creatinine, Ser: 0.73 mg/dL (ref 0.44–1.00)
GFR calc Af Amer: >60 mL/min (ref >60)
Glucose, Bld: 97 mg/dL (ref 65–99)
POTASSIUM: 4.1 mmol/L (ref 3.5–5.1)
SODIUM: 140 mmol/L (ref 135–145)

## 2015-04-17 LAB — CBC WITH DIFFERENTIAL/PLATELET
BASOS ABS: 0.3 10*3/uL — AB (ref 0–0.1)
Basophils Relative: 3 %
Eosinophils Absolute: 0.1 10*3/uL (ref 0–0.7)
Eosinophils Relative: 2 %
HCT: 40.3 % (ref 35.0–47.0)
HEMOGLOBIN: 13.3 g/dL (ref 12.0–16.0)
LYMPHS ABS: 1.9 10*3/uL (ref 1.0–3.6)
LYMPHS PCT: 23 %
MCH: 29.2 pg (ref 26.0–34.0)
MCHC: 33 g/dL (ref 32.0–36.0)
MCV: 88.4 fL (ref 80.0–100.0)
MONO ABS: 0.8 10*3/uL (ref 0.2–0.9)
Monocytes Relative: 9 %
NEUTROS ABS: 5.2 10*3/uL (ref 1.4–6.5)
Neutrophils Relative %: 63 %
Platelets: 190 10*3/uL (ref 150–440)
RBC: 4.56 MIL/uL (ref 3.80–5.20)
RDW: 15 % — AB (ref 11.5–14.5)
WBC: 8.2 10*3/uL (ref 3.6–11.0)

## 2015-04-17 NOTE — Progress Notes (Signed)
[facility]  [phone]  [phone]     Victoria Contreras DOB: 11/06/2021  MR#: [id number]  EGB#:[id number]  Patient Care Team: Madelyn Brunner, MD as PCP - General (Internal Medicine)  CHIEF COMPLAINT:  Chief Complaint  Patient presents with  . Follow-up  . Leukemia   Patient has history of CLL, hemangiopericytoma, and pulmonary nodules.   INTERVAL HISTORY:  Patient returns to clinic today for further follow-up and treatment consideration regarding CLL, hemangiopericytoma. She reports being followed closely by Dr. Lisette Grinder. Patient was originally diagnosed CLL in July 2009 and received R-CVP. 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 First post surgical PET scan was in May 2010. PET scan from May 2015 revealed new 7 mm pulmonary nodules in the right lower lobe that were reported as being PET negative. She overall feels very well and denies any complaints today other than some mild weakness and fatigue.  REVIEW OF SYSTEMS:   Review of Systems  Constitutional: Positive for malaise/fatigue. Negative for fever, chills, weight loss and diaphoresis.  HENT: Negative for congestion, ear discharge, ear pain, hearing loss, nosebleeds, sore throat and tinnitus.   Eyes: Negative for blurred vision, double vision, photophobia, pain, discharge and redness.  Respiratory: Negative for cough, hemoptysis, sputum production, shortness of breath, wheezing and stridor.   Cardiovascular: Negative for chest pain, palpitations, orthopnea, claudication, leg swelling and PND.  Gastrointestinal: Negative for heartburn, nausea, vomiting, abdominal pain, diarrhea, constipation, blood in stool and melena.  Genitourinary: Negative.   Musculoskeletal: Negative.   Skin: Negative.   Neurological: Positive for weakness. Negative for dizziness, tingling, focal weakness, seizures and headaches.    Endo/Heme/Allergies: Does not bruise/bleed easily.  Psychiatric/Behavioral: Negative for depression. The patient is not nervous/anxious and does not have insomnia.     As per HPI. Otherwise, a complete review of systems is negatve.  ONCOLOGY HISTORY:  No history exists.    PAST MEDICAL HISTORY: Past Medical History  Diagnosis Date  . Hemangiopericytoma   . CLL (chronic lymphocytic leukemia)   . Frequent nosebleeds   . Atrial fibrillation   . HTN (hypertension)   . UTI (lower urinary tract infection)   . Gout   . CVA (cerebral infarction)   . Cataracts, both eyes   . CLL (chronic lymphoid leukemia) in relapse 04/17/2015  . 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    PAST SURGICAL HISTORY: Past Surgical History  Procedure Laterality Date  . Breast lumpectomy    . Tonsillectomy    . Hemangiopericytoma resection    . Total knee arthroplasty      right knee  . Dilation and curettage of uterus    . Cataract extraction bilateral w/ anterior vitrectomy      right/left eye  . Cholecystectomy    . Appendectomy      FAMILY HISTORY Family History  Problem Relation Age of Onset  . Melanoma Daughter 105    GYNECOLOGIC HISTORY:  No LMP recorded.     ADVANCED DIRECTIVES:    HEALTH MAINTENANCE: Social History  Substance Use Topics  . Smoking status: Former Smoker    Types: Cigarettes  . Smokeless tobacco: None     Comment: quit 1990  . Alcohol Use: None     Colonoscopy:  PAP:  Bone density:  Lipid panel:  Allergies  Allergen Reactions  . Codeine     Other reaction(s): Unknown  . Tape Itching    Other reaction(s): Itching of Skin  Current Outpatient Prescriptions  Medication Sig Dispense Refill  . acetaminophen (TYLENOL) 500 MG tablet Take 500 mg by mouth every 4 (four) hours as needed for mild pain or fever.    Marland Kitchen acyclovir (ZOVIRAX) 400 MG tablet 400 mg 1 day or 1 dose. Once daily prn shingles    . allopurinol (ZYLOPRIM) 300 MG tablet Take 300 mg by mouth daily.     Marland Kitchen co-enzyme Q-10 30 MG capsule Take 100 mg by mouth 1 day or 1 dose. In am    . hydrochlorothiazide (HYDRODIURIL) 25 MG tablet Take 25 mg by mouth every morning.    . metoprolol succinate (TOPROL-XL) 25 MG 24 hr tablet Take 12.5 mg by mouth 2 (two) times daily.    . Multiple Vitamins-Minerals (MULTIVITAMIN ADULT) TABS Take 1 tablet by mouth.    . rivaroxaban (XARELTO) 20 MG TABS tablet Take 20 mg by mouth daily with supper.    . simvastatin (ZOCOR) 20 MG tablet Take 20 mg by mouth daily.    . naproxen sodium (ANAPROX) 220 MG tablet Take 440 mg by mouth 2 (two) times daily with a meal. Prn pain    . potassium chloride SA (K-DUR,KLOR-CON) 20 MEQ tablet Take 20 mEq by mouth 2 (two) times daily.     No current facility-administered medications for this visit.    OBJECTIVE: BP 125/65 mmHg  Pulse 65  Temp(Src) 98.6 F (37 C) (Tympanic)  Wt 200 lb 1.1 oz (90.75 kg)   Body mass index is 30.15 kg/(m^2).    ECOG FS:1 - Symptomatic but completely ambulatory  General: Well-developed, well-nourished, no acute distress. Eyes: Pink conjunctiva, anicteric sclera. HEENT: Normocephalic, moist mucous membranes, clear oropharnyx. Lungs: Clear to auscultation bilaterally. Heart: Regular rate and rhythm. No rubs, murmurs, or gallops. Abdomen: Soft, nontender, nondistended. No organomegaly noted, normoactive bowel sounds. Musculoskeletal: No edema, cyanosis, or clubbing. Neuro: Alert, answering all questions appropriately. Cranial nerves grossly intact. Skin: No rashes or petechiae noted. Psych: Normal affect. Lymphatics: No cervical, calvicular, axillary or inguinal LAD.   LAB RESULTS:  Appointment on 04/17/2015  Component Date Value Ref Range Status  . WBC 04/17/2015 8.2  3.6 - 11.0 K/uL Final  . RBC 04/17/2015 4.56  3.80 - 5.20 MIL/uL Final  . Hemoglobin 04/17/2015 13.3  12.0 - 16.0 g/dL Final  . HCT 04/17/2015 40.3  35.0 - 47.0 % Final  . MCV 04/17/2015 88.4  80.0 - 100.0 fL Final  . MCH  04/17/2015 29.2  26.0 - 34.0 pg Final  . MCHC 04/17/2015 33.0  32.0 - 36.0 g/dL Final  . RDW 04/17/2015 15.0* 11.5 - 14.5 % Final  . Platelets 04/17/2015 190  150 - 440 K/uL Final  . Neutrophils Relative % 04/17/2015 63   Final  . Neutro Abs 04/17/2015 5.2  1.4 - 6.5 K/uL Final  . Lymphocytes Relative 04/17/2015 23   Final  . Lymphs Abs 04/17/2015 1.9  1.0 - 3.6 K/uL Final  . Monocytes Relative 04/17/2015 9   Final  . Monocytes Absolute 04/17/2015 0.8  0.2 - 0.9 K/uL Final  . Eosinophils Relative 04/17/2015 2   Final  . Eosinophils Absolute 04/17/2015 0.1  0 - 0.7 K/uL Final  . Basophils Relative 04/17/2015 3   Final  . Basophils Absolute 04/17/2015 0.3* 0 - 0.1 K/uL Final  . LDH 04/17/2015 120  98 - 192 U/L Final  . Sodium 04/17/2015 140  135 - 145 mmol/L Final  . Potassium 04/17/2015 4.1  3.5 - 5.1 mmol/L Final  .  Chloride 04/17/2015 105  101 - 111 mmol/L Final  . CO2 04/17/2015 30  22 - 32 mmol/L Final  . Glucose, Bld 04/17/2015 97  65 - 99 mg/dL Final  . BUN 04/17/2015 14  6 - 20 mg/dL Final  . Creatinine, Ser 04/17/2015 0.73  0.44 - 1.00 mg/dL Final  . Calcium 04/17/2015 9.1  8.9 - 10.3 mg/dL Final  . GFR calc non Af Amer 04/17/2015 >60  >60 mL/min Final  . GFR calc Af Amer 04/17/2015 >60  >60 mL/min Final   Comment: (NOTE) The eGFR has been calculated using the CKD EPI equation. This calculation has not been validated in all clinical situations. eGFR's persistently <60 mL/min signify possible Chronic Kidney Disease.   . Anion gap 04/17/2015 5  5 - 15 Final    STUDIES: No results found.  ASSESSMENT:  CLL Hemangiopericytoma Pulmonary nodules  PLAN:    1. CLL. Patient was previously treated in 2009 with R-CVP. Clinically there is no evidence of recurrent disease.   2. Hemangiopericytoma. Previously diagnosed in 2010 with surgical removal. At that time was informed that would likely recur. Recommendation was for every 6 month PET scans for 5 years which have been  completed. At that time and PET scans were changed to yearly to continue for at least 5 years. PET scan was due in May 2016 therefore will be scheduled for reevaluation.  3. Pulmonary nodules. Will be followed on PET scan. Were previously noted to be stable with noncontrast CT scans.   Patient will return to clinic in 6 months. If there is any abnormality on PET scan that is scheduled for August 16 we will contact her.  Patient expressed understanding and was in agreement with this plan. She also understands that She can call clinic at any time with any questions, concerns, or complaints.   Dr. Oliva Bustard was available for consultation and review of plan of care for this patient.    Evlyn Kanner, NP   04/17/2015 4:08 PM

## 2015-04-23 ENCOUNTER — Ambulatory Visit
Admission: RE | Admit: 2015-04-23 | Discharge: 2015-04-23 | Disposition: A | Discharge status: Home / Self Care | Payer: Medicare Other | Source: Ambulatory Visit | Attending: Family Medicine | Admitting: Family Medicine

## 2015-04-23 DIAGNOSIS — C9112 Chronic lymphocytic leukemia of B-cell type in relapse: Secondary | ICD-10-CM | POA: Diagnosis not present

## 2015-04-23 DIAGNOSIS — Z79899 Other long term (current) drug therapy: Secondary | ICD-10-CM | POA: Diagnosis not present

## 2015-04-23 DIAGNOSIS — D481 Neoplasm of uncertain behavior of connective and other soft tissue: Secondary | ICD-10-CM | POA: Insufficient documentation

## 2015-04-23 DIAGNOSIS — R911 Solitary pulmonary nodule: Secondary | ICD-10-CM | POA: Diagnosis not present

## 2015-04-23 DIAGNOSIS — C9192 Lymphoid leukemia, unspecified, in relapse: Secondary | ICD-10-CM

## 2015-04-23 LAB — GLUCOSE, CAPILLARY: Glucose-Capillary: 122 mg/dL — ABNORMAL HIGH (ref 65–99)

## 2015-04-23 MED ORDER — FLUDEOXYGLUCOSE F - 18 (FDG) INJECTION
12.4600 | Freq: Once | INTRAVENOUS | Status: DC | PRN
  Administered 2015-04-23: 12.46 via INTRAVENOUS
  Filled 2015-04-23: qty 12.46

## 2015-09-09 DIAGNOSIS — C9110 Chronic lymphocytic leukemia of B-cell type not having achieved remission: Secondary | ICD-10-CM | POA: Diagnosis not present

## 2015-09-09 DIAGNOSIS — D481 Neoplasm of uncertain behavior of connective and other soft tissue: Secondary | ICD-10-CM | POA: Diagnosis not present

## 2015-09-09 DIAGNOSIS — I10 Essential (primary) hypertension: Secondary | ICD-10-CM | POA: Diagnosis not present

## 2015-09-09 DIAGNOSIS — I480 Paroxysmal atrial fibrillation: Secondary | ICD-10-CM | POA: Diagnosis not present

## 2015-09-09 DIAGNOSIS — E782 Mixed hyperlipidemia: Secondary | ICD-10-CM | POA: Diagnosis not present

## 2015-10-06 DIAGNOSIS — R509 Fever, unspecified: Secondary | ICD-10-CM | POA: Diagnosis not present

## 2015-10-06 DIAGNOSIS — R531 Weakness: Secondary | ICD-10-CM | POA: Diagnosis not present

## 2015-10-06 DIAGNOSIS — C9110 Chronic lymphocytic leukemia of B-cell type not having achieved remission: Secondary | ICD-10-CM | POA: Diagnosis not present

## 2015-10-07 DIAGNOSIS — R531 Weakness: Secondary | ICD-10-CM | POA: Diagnosis not present

## 2015-10-07 DIAGNOSIS — R509 Fever, unspecified: Secondary | ICD-10-CM | POA: Diagnosis not present

## 2015-10-13 DIAGNOSIS — R300 Dysuria: Secondary | ICD-10-CM | POA: Diagnosis not present

## 2015-10-20 ENCOUNTER — Inpatient Hospital Stay: Payer: Medicare Other

## 2015-10-20 ENCOUNTER — Inpatient Hospital Stay: Payer: Medicare Other | Attending: Internal Medicine

## 2015-10-23 DIAGNOSIS — N343 Urethral syndrome, unspecified: Secondary | ICD-10-CM | POA: Diagnosis not present

## 2015-10-28 DIAGNOSIS — R350 Frequency of micturition: Secondary | ICD-10-CM | POA: Diagnosis not present

## 2015-11-05 ENCOUNTER — Encounter: Payer: Self-pay | Admitting: Urology

## 2015-11-05 ENCOUNTER — Ambulatory Visit (INDEPENDENT_AMBULATORY_CARE_PROVIDER_SITE_OTHER): Payer: Medicare Other | Admitting: Urology

## 2015-11-05 VITALS — BP 129/66 | HR 70 | Ht 69.0 in | Wt 196.8 lb

## 2015-11-05 DIAGNOSIS — IMO0001 Reserved for inherently not codable concepts without codable children: Secondary | ICD-10-CM

## 2015-11-05 DIAGNOSIS — IMO0002 Reserved for concepts with insufficient information to code with codable children: Secondary | ICD-10-CM

## 2015-11-05 DIAGNOSIS — N952 Postmenopausal atrophic vaginitis: Secondary | ICD-10-CM

## 2015-11-05 DIAGNOSIS — R300 Dysuria: Secondary | ICD-10-CM | POA: Diagnosis not present

## 2015-11-05 DIAGNOSIS — N8110 Cystocele, unspecified: Secondary | ICD-10-CM | POA: Diagnosis not present

## 2015-11-05 LAB — BLADDER SCAN AMB NON-IMAGING: SCAN RESULT: 67

## 2015-11-05 MED ORDER — ESTRADIOL 0.1 MG/GM VA CREA: TOPICAL_CREAM | VAGINAL | Status: DC

## 2015-11-05 NOTE — Progress Notes (Signed)
[id number] PM   Victoria Contreras [id number]  Referring provider: Madelyn Brunner, MD [facility] [place], [place] [address]  Chief Complaint  Patient presents with  . Dysuria    referred by Dr. Lisette Grinder  . Urinary Frequency    HPI: Patient is an 82 year old Caucasian female who presents today as a referral from her PCP, Dr. Gilford Rile, for persistent dysuria and frequency.  Patient presents today with her son, Victoria Contreras, and states that 3 weeks ago she had the sudden onset of tremendous suprapubic pressure and urgency.  She states that she had sweats associated with the infections.   She denied any gross hematuria, fever, nausea or vomiting. She does not have a prior history of kidney stone disease. She states that she was placed on an antibiotic, but did not find relief. She then states she was placed on 2 courses of sulfur antibiotics and her symptoms did not improve.  She states yesterday her symptoms suddenly abated.  Today, she states she has no symptoms of urinary tract infection at this time. Her PVR is 67 mL.  A sign urinary symptoms consist of urinary incontinence of a mixed urinary incontinence. She wears 2 depends daily. She has nocturia 2-3 and frequency for which she contributes drinking a lot of water.   PMH: Past Medical History  Diagnosis Date  . Hemangiopericytoma   . CLL (chronic lymphocytic leukemia) ([facility])   . Frequent nosebleeds   . Atrial fibrillation ([place])   . HTN (hypertension)   . UTI (lower urinary tract infection)   . Gout   . CVA (cerebral infarction)   . Cataracts, both eyes   . CLL (chronic lymphoid leukemia) in relapse ([facility]) 04/17/2015  . Heart attack ([place])   . Heart burn   . HLD (hyperlipidemia)     Surgical History: Past Surgical History  Procedure Laterality Date  . Breast lumpectomy    . Tonsillectomy    . Hemangiopericytoma resection    . Total knee arthroplasty      right knee  .  Dilation and curettage of uterus    . Cataract extraction bilateral w/ anterior vitrectomy      right/left eye  . Cholecystectomy    . Appendectomy      Home Medications:    Medication List       This list is accurate as of: 11/05/15 12:04 PM.  Always use your most recent med list.               acetaminophen 500 MG tablet  Commonly known as:  TYLENOL  Take 500 mg by mouth every 4 (four) hours as needed for mild pain or fever.     acyclovir 400 MG tablet  Commonly known as:  ZOVIRAX  400 mg 1 day or 1 dose. Reported on 11/05/2015     allopurinol 300 MG tablet  Commonly known as:  ZYLOPRIM  Take 300 mg by mouth daily.     co-enzyme Q-10 30 MG capsule  Take 100 mg by mouth 1 day or 1 dose. In am     estradiol 0.1 MG/GM vaginal cream  Commonly known as:  ESTRACE VAGINAL  Apply 0.5mg  (pea-sized amount)  just inside the vaginal introitus with a finger-tip every night for two weeks and then Monday, Wednesday and Friday nights.     hydrochlorothiazide 25 MG tablet  Commonly known as:  HYDRODIURIL  Take 25 mg by mouth every morning.  metoprolol 50 MG tablet  Commonly known as:  LOPRESSOR     metoprolol succinate 25 MG 24 hr tablet  Commonly known as:  TOPROL-XL  Take 12.5 mg by mouth 2 (two) times daily. Reported on 11/05/2015     MULTIVITAMIN ADULT Tabs  Take 1 tablet by mouth.     naproxen sodium 220 MG tablet  Commonly known as:  ANAPROX  Take 440 mg by mouth 2 (two) times daily with a meal. Reported on 11/05/2015     phenazopyridine 100 MG tablet  Commonly known as:  PYRIDIUM  Reported on 11/05/2015     potassium chloride SA 20 MEQ tablet  Commonly known as:  K-DUR,KLOR-CON  Take 20 mEq by mouth 2 (two) times daily.     rivaroxaban 20 MG Tabs tablet  Commonly known as:  XARELTO  Take 20 mg by mouth daily with supper.     simvastatin 20 MG tablet  Commonly known as:  ZOCOR  Take 20 mg by mouth daily.     sulfamethoxazole-trimethoprim 800-160 MG tablet    Commonly known as:  BACTRIM DS,SEPTRA DS  Reported on 11/05/2015        Allergies:  Allergies  Allergen Reactions  . Codeine     Other reaction(s): Unknown  . Tape Itching    Other reaction(s): Itching of Skin    Family History: Family History  Problem Relation Age of Onset  . Melanoma Daughter 65  . Kidney disease Neg Hx   . Bladder Cancer Neg Hx     Social History:  reports that she has quit smoking. Her smoking use included Cigarettes. She does not have any smokeless tobacco history on file. She reports that she does not drink alcohol or use illicit drugs.  ROS: UROLOGY Frequent Urination?: Yes Hard to postpone urination?: Yes Burning/pain with urination?: Yes Get up at night to urinate?: No Leakage of urine?: No Urine stream starts and stops?: No Trouble starting stream?: No Do you have to strain to urinate?: No Blood in urine?: No Urinary tract infection?: No Sexually transmitted disease?: No Injury to kidneys or bladder?: No Painful intercourse?: No Weak stream?: No Currently pregnant?: No Vaginal bleeding?: No Last menstrual period?: n  Gastrointestinal Nausea?: No Vomiting?: No Indigestion/heartburn?: No Diarrhea?: No Constipation?: No  Constitutional Fever: No Night sweats?: Yes Weight loss?: No Fatigue?: No  Skin Skin rash/lesions?: No Itching?: No  Eyes Blurred vision?: No Double vision?: No  Ears/Nose/Throat Sore throat?: No Sinus problems?: No  Hematologic/Lymphatic Swollen glands?: No Easy bruising?: No  Cardiovascular Leg swelling?: No Chest pain?: No  Respiratory Cough?: No Shortness of breath?: No  Endocrine Excessive thirst?: No  Musculoskeletal Back pain?: No Joint pain?: No  Neurological Headaches?: No Dizziness?: No  Psychologic Depression?: No Anxiety?: No  Physical Exam: BP 129/66 mmHg  Pulse 70  Ht 5\' 9"  (1.753 m)  Wt 196 lb 12.8 oz (89.268 kg)  BMI 29.05 kg/m2  Constitutional: Well  nourished. Alert and oriented, No acute distress. HEENT: [place] AT, moist mucus membranes. Trachea midline, no masses. Cardiovascular: No clubbing, cyanosis, or edema. Respiratory: Normal respiratory effort, no increased work of breathing. GI: Abdomen is soft, non tender, non distended, no abdominal masses. Liver and spleen not palpable.  No hernias appreciated.  Stool sample for occult testing is not indicated.   GU: No CVA tenderness.  No bladder fullness or masses.  Atrophic external genitalia, normal pubic hair distribution, no lesions.  Normal urethral meatus, no lesions, no prolapse, no discharge.  Urethral caruncle is noted.  No bladder fullness, tenderness or masses. Normal vagina mucosa, good estrogen effect, no discharge, no lesions, good pelvic support, Grade II cystocele is noted.  No rectocele is noted.  No cervical motion tenderness.  Uterus is freely mobile and non-fixed.  No adnexal/parametria masses or tenderness noted.  Anus and perineum are without rashes or lesions.    Skin: No rashes, bruises or suspicious lesions. Lymph: No cervical or inguinal adenopathy. Neurologic: Grossly intact, no focal deficits, moving all 4 extremities. Psychiatric: Normal mood and affect.  Laboratory Data: Lab Results  Component Value Date   WBC 8.2 04/17/2015   HGB 13.3 04/17/2015   HCT 40.3 04/17/2015   MCV 88.4 04/17/2015   PLT 190 04/17/2015    Lab Results  Component Value Date   CREATININE 0.73 04/17/2015        Component Value Date/Time   CHOL 104 12/29/2013 0437   HDL 29* 12/29/2013 0437   VLDL 32 12/29/2013 0437   LDLCALC 43 12/29/2013 0437    Lab Results  Component Value Date   AST 37 12/27/2013   Lab Results  Component Value Date   ALT 37 12/27/2013    Pertinent Imaging: Results for OLAYINKA, NOHL (MRN [id number]) as of 11/06/2015 00:13  Ref. Range 11/05/2015 11:23  Scan Result Unknown 67    Assessment & Plan:    1. Dysuria:   Patient's dysuria and other urinary  symptoms suddenly abated yesterday.  She was unable to give a urine specimen at today's exam. Her UA at her primary care physician's office was unremarkable.   2. Cystocele:   Patient with a grade 2 cystocele. She does have symptoms of stress urinary incontinence for which she is managing with depends at this time. We will continue to monitor.   - BLADDER SCAN AMB NON-IMAGING  3. Atrophic vaginitis:   Patient was given a sample of Premarin vaginal estrogen cream and instructed to apply 0.5mg  (pea-sized amount)  just inside the vaginal introitus with a finger-tip every night for two weeks and then Monday, Wednesday and Friday nights.  I explained to the patient that vaginally administered estrogen, which causes only a slight increase in the blood estrogen levels, have fewer contraindications and adverse systemic effects that oral HT.  A prescription for Estrace cream is sent to her pharmacy.  Return in about 3 months (around 02/05/2016) for exam.  These notes generated with voice recognition software. I apologize for typographical errors.  Zara Council, [facility] [address] [place], [place] [address] [phone]

## 2015-11-06 DIAGNOSIS — R300 Dysuria: Secondary | ICD-10-CM | POA: Insufficient documentation

## 2015-11-06 DIAGNOSIS — N952 Postmenopausal atrophic vaginitis: Secondary | ICD-10-CM | POA: Insufficient documentation

## 2015-11-06 DIAGNOSIS — IMO0002 Reserved for concepts with insufficient information to code with codable children: Secondary | ICD-10-CM

## 2015-11-06 DIAGNOSIS — IMO0001 Reserved for inherently not codable concepts without codable children: Secondary | ICD-10-CM | POA: Insufficient documentation

## 2015-11-07 ENCOUNTER — Other Ambulatory Visit: Payer: Self-pay

## 2015-11-07 DIAGNOSIS — N952 Postmenopausal atrophic vaginitis: Secondary | ICD-10-CM

## 2015-11-07 MED ORDER — ESTRADIOL 0.1 MG/GM VA CREA: TOPICAL_CREAM | VAGINAL | Status: DC

## 2015-11-20 ENCOUNTER — Telehealth: Payer: Self-pay | Admitting: Obstetrics and Gynecology

## 2015-11-20 DIAGNOSIS — N952 Postmenopausal atrophic vaginitis: Secondary | ICD-10-CM

## 2015-11-20 NOTE — Telephone Encounter (Signed)
Patient called today and requested a prescription for estrace cream to be sent to [facility]

## 2015-11-26 MED ORDER — ESTRADIOL 0.1 MG/GM VA CREA: TOPICAL_CREAM | VAGINAL | Status: DC

## 2015-11-26 NOTE — Telephone Encounter (Signed)
Medication sent to [facility]  

## 2016-02-03 DIAGNOSIS — I10 Essential (primary) hypertension: Secondary | ICD-10-CM | POA: Diagnosis not present

## 2016-02-05 ENCOUNTER — Encounter: Payer: Self-pay | Admitting: Urology

## 2016-02-05 ENCOUNTER — Ambulatory Visit (INDEPENDENT_AMBULATORY_CARE_PROVIDER_SITE_OTHER): Payer: Medicare Other | Admitting: Urology

## 2016-02-05 VITALS — BP 99/65 | HR 81 | Ht 69.0 in | Wt 189.7 lb

## 2016-02-05 DIAGNOSIS — N952 Postmenopausal atrophic vaginitis: Secondary | ICD-10-CM | POA: Diagnosis not present

## 2016-02-05 DIAGNOSIS — IMO0001 Reserved for inherently not codable concepts without codable children: Secondary | ICD-10-CM

## 2016-02-05 DIAGNOSIS — N8110 Cystocele, unspecified: Secondary | ICD-10-CM | POA: Diagnosis not present

## 2016-02-05 DIAGNOSIS — IMO0002 Reserved for concepts with insufficient information to code with codable children: Secondary | ICD-10-CM

## 2016-02-05 NOTE — Progress Notes (Signed)
0000000 AM   Victoria Contreras [id number]  Referring provider: Madelyn Brunner, MD Victoria Contreras Victoria Contreras, Victoria Contreras [id number]  Chief Complaint  Patient presents with  . Dysuria    3 month follow up  . Vaginitis    HPI: Patient is an 82 year old Caucasian female who presents today for a 3 month follow-up after being prescribed vaginal estrogen cream for atrophic vaginitis and dysuria.  Background history Patient presented today as a referral from her PCP, Dr. Gilford Contreras, for persistent dysuria and frequency.  Patient presented with her son, Victoria Contreras, and stated that she had the sudden onset of tremendous suprapubic pressure and urgency.  She stated that she had sweats associated with the infections.   She denied any gross hematuria, fever, nausea or vomiting. She does not have a prior history of kidney stone disease. She stated that she was placed on an antibiotic, but did not find relief. She then stated she was placed on 2 courses of sulfur antibiotics and her symptoms did not improve.  Then her symptoms suddenly abated.    Today, she states she still has frequency of urination, urgency, nocturia and mixed urinary incontinence. She denies dysuria, gross hematuria and suprapubic pain. She's not had any recent fevers, chills, nausea or vomiting.  She is using the vaginal estrogen cream as prescribed, 3 nights weekly. She does not need a refill at this time.   PMH: Past Medical History  Diagnosis Date  . Hemangiopericytoma   . CLL (chronic lymphocytic leukemia) ([place])   . Frequent nosebleeds   . Atrial fibrillation (Victoria Contreras)   . HTN (hypertension)   . UTI (lower urinary tract infection)   . Gout   . CVA (cerebral infarction)   . Cataracts, both eyes   . CLL (chronic lymphoid leukemia) in relapse (Victoria Contreras) 04/17/2015  . Heart attack (Victoria Contreras)   . Heart burn   . HLD (hyperlipidemia)     Surgical History: Past Surgical History  Procedure Laterality Date  .  Breast lumpectomy    . Tonsillectomy    . Hemangiopericytoma resection    . Total knee arthroplasty      right knee  . Dilation and curettage of uterus    . Cataract extraction bilateral w/ anterior vitrectomy      right/left eye  . Cholecystectomy    . Appendectomy      Home Medications:    Medication List       This list is accurate as of: 02/05/16 11:16 AM.  Always use your most recent med list.               acetaminophen 500 MG tablet  Commonly known as:  TYLENOL  Take 500 mg by mouth every 4 (four) hours as needed for mild pain or fever.     acyclovir 400 MG tablet  Commonly known as:  ZOVIRAX  400 mg 1 day or 1 dose. Reported on 11/05/2015     allopurinol 300 MG tablet  Commonly known as:  ZYLOPRIM  Take 300 mg by mouth daily.     co-enzyme Q-10 30 MG capsule  Take 100 mg by mouth 1 day or 1 dose. In am     estradiol 0.1 MG/GM vaginal cream  Commonly known as:  ESTRACE VAGINAL  Apply 0.5mg  (pea-sized amount)  just inside the vaginal introitus with a finger-tip every night for two weeks and then Monday, Wednesday and Friday nights.     hydrochlorothiazide  25 MG tablet  Commonly known as:  HYDRODIURIL  Take 25 mg by mouth every morning.     metoprolol succinate 25 MG 24 hr tablet  Commonly known as:  TOPROL-XL  Take 12.5 mg by mouth 2 (two) times daily. Reported on 11/05/2015     MULTIVITAMIN ADULT Tabs  Take 1 tablet by mouth.     naproxen sodium 220 MG tablet  Commonly known as:  ANAPROX  Take 440 mg by mouth 2 (two) times daily with a meal. Reported on 11/05/2015     phenazopyridine 100 MG tablet  Commonly known as:  PYRIDIUM  Reported on 02/05/2016     potassium chloride SA 20 MEQ tablet  Commonly known as:  K-DUR,KLOR-CON  Take 20 mEq by mouth 2 (two) times daily.     rivaroxaban 20 MG Tabs tablet  Commonly known as:  XARELTO  Take 20 mg by mouth daily with supper.     simvastatin 20 MG tablet  Commonly known as:  ZOCOR  Take 20 mg by mouth  daily.     sulfamethoxazole-trimethoprim 800-160 MG tablet  Commonly known as:  BACTRIM DS,SEPTRA DS  Reported on 02/05/2016        Allergies:  Allergies  Allergen Reactions  . Codeine     Other reaction(s): Unknown  . Tape Itching    Other reaction(s): Itching of Skin    Family History: Family History  Problem Relation Age of Onset  . Melanoma Daughter 97  . Kidney disease Neg Hx   . Bladder Cancer Neg Hx     Social History:  reports that she has quit smoking. Her smoking use included Cigarettes. She does not have any smokeless tobacco history on file. She reports that she does not drink alcohol or use illicit drugs.  ROS: UROLOGY Frequent Urination?: Yes Hard to postpone urination?: Yes Burning/pain with urination?: No Get up at night to urinate?: Yes Leakage of urine?: Yes Urine stream starts and stops?: No Trouble starting stream?: No Do you have to strain to urinate?: No Blood in urine?: No Urinary tract infection?: No Sexually transmitted disease?: No Injury to kidneys or bladder?: No Painful intercourse?: No Weak stream?: No Currently pregnant?: No Vaginal bleeding?: No Last menstrual period?: n  Gastrointestinal Nausea?: No Vomiting?: No Indigestion/heartburn?: No Diarrhea?: No Constipation?: No  Constitutional Fever: No Night sweats?: Yes Weight loss?: No Fatigue?: Yes  Skin Skin rash/lesions?: No Itching?: No  Eyes Blurred vision?: No Double vision?: No  Ears/Nose/Throat Sore throat?: No Sinus problems?: Yes  Hematologic/Lymphatic Swollen glands?: No Easy bruising?: No  Cardiovascular Leg swelling?: Yes Chest pain?: No  Respiratory Cough?: Yes Shortness of breath?: No  Endocrine Excessive thirst?: No  Musculoskeletal Back pain?: No Joint pain?: No  Neurological Headaches?: No Dizziness?: No  Psychologic Depression?: No Anxiety?: No  Physical Exam: BP 99/65 mmHg  Pulse 81  Ht 5\' 9"  (1.753 m)  Wt 189 lb 11.2  oz (86.047 kg)  BMI 28.00 kg/m2  Constitutional: Well nourished. Alert and oriented, No acute distress. HEENT: [place] AT, moist mucus membranes. Trachea midline, no masses. Cardiovascular: No clubbing, cyanosis, or edema. Respiratory: Normal respiratory effort, no increased work of breathing. GI: Abdomen is soft, non tender, non distended, no abdominal masses. Liver and spleen not palpable.  No hernias appreciated.  Stool sample for occult testing is not indicated.   GU: No CVA tenderness.  No bladder fullness or masses.  Atrophic external genitalia, normal pubic hair distribution, no lesions.  Normal urethral meatus, no  lesions, no prolapse, no discharge.   Urethral caruncle is noted.  No bladder fullness, tenderness or masses. Normal vagina mucosa, good estrogen effect, no discharge, no lesions, good pelvic support, Grade II cystocele is noted.  No rectocele is noted.  No cervical motion tenderness.  Uterus is freely mobile and non-fixed.  No adnexal/parametria masses or tenderness noted.  Anus and perineum are without rashes or lesions.    Skin: No rashes, bruises or suspicious lesions. Lymph: No cervical or inguinal adenopathy. Neurologic: Grossly intact, no focal deficits, moving all 4 extremities. Psychiatric: Normal mood and affect.  Laboratory Data: Lab Results  Component Value Date   WBC 8.2 04/17/2015   HGB 13.3 04/17/2015   HCT 40.3 04/17/2015   MCV 88.4 04/17/2015   PLT 190 04/17/2015    Lab Results  Component Value Date   CREATININE 0.73 04/17/2015        Component Value Date/Time   CHOL 104 12/29/2013 0437   HDL 29* 12/29/2013 0437   VLDL 32 12/29/2013 0437   LDLCALC 43 12/29/2013 0437    Lab Results  Component Value Date   AST 37 12/27/2013   Lab Results  Component Value Date   ALT 37 12/27/2013    Assessment & Plan:    1. Dysuria:   Resolved.    2. Cystocele:   Patient with a grade 2 cystocele. She does have symptoms of stress urinary incontinence for  which she is managing with depends at this time. We will continue to monitor.   3. Atrophic vaginitis:   Patient will continue the vaginal estrogen cream, applying it 3 nights weekly. She will follow-up in 12 months for exam and symptom recheck.   Return in about 1 year (around 02/04/2017) for exam and PVR.  These notes generated with voice recognition software. I apologize for typographical errors.  Zara Council, [facility] [address] [place], [place] [address] [phone]

## 2016-02-07 ENCOUNTER — Other Ambulatory Visit: Payer: Self-pay | Admitting: Urology

## 2016-02-10 DIAGNOSIS — J019 Acute sinusitis, unspecified: Secondary | ICD-10-CM | POA: Diagnosis not present

## 2016-02-10 DIAGNOSIS — I10 Essential (primary) hypertension: Secondary | ICD-10-CM | POA: Diagnosis not present

## 2016-02-10 DIAGNOSIS — C9110 Chronic lymphocytic leukemia of B-cell type not having achieved remission: Secondary | ICD-10-CM | POA: Diagnosis not present

## 2016-02-10 DIAGNOSIS — B9689 Other specified bacterial agents as the cause of diseases classified elsewhere: Secondary | ICD-10-CM | POA: Diagnosis not present

## 2016-02-10 DIAGNOSIS — I480 Paroxysmal atrial fibrillation: Secondary | ICD-10-CM | POA: Diagnosis not present

## 2016-03-31 DIAGNOSIS — I4891 Unspecified atrial fibrillation: Secondary | ICD-10-CM | POA: Diagnosis not present

## 2016-03-31 DIAGNOSIS — E782 Mixed hyperlipidemia: Secondary | ICD-10-CM | POA: Diagnosis not present

## 2016-03-31 DIAGNOSIS — I10 Essential (primary) hypertension: Secondary | ICD-10-CM | POA: Diagnosis not present

## 2016-03-31 DIAGNOSIS — C9110 Chronic lymphocytic leukemia of B-cell type not having achieved remission: Secondary | ICD-10-CM | POA: Diagnosis not present

## 2016-05-08 ENCOUNTER — Other Ambulatory Visit: Payer: Self-pay | Admitting: Urology

## 2016-07-01 DIAGNOSIS — Z23 Encounter for immunization: Secondary | ICD-10-CM | POA: Diagnosis not present

## 2016-07-22 DIAGNOSIS — C9110 Chronic lymphocytic leukemia of B-cell type not having achieved remission: Secondary | ICD-10-CM | POA: Diagnosis not present

## 2016-07-22 DIAGNOSIS — I10 Essential (primary) hypertension: Secondary | ICD-10-CM | POA: Diagnosis not present

## 2016-07-27 DIAGNOSIS — I480 Paroxysmal atrial fibrillation: Secondary | ICD-10-CM | POA: Diagnosis not present

## 2016-07-27 DIAGNOSIS — Z0000 Encounter for general adult medical examination without abnormal findings: Secondary | ICD-10-CM | POA: Diagnosis not present

## 2016-07-27 DIAGNOSIS — I10 Essential (primary) hypertension: Secondary | ICD-10-CM | POA: Diagnosis not present

## 2016-07-27 DIAGNOSIS — E782 Mixed hyperlipidemia: Secondary | ICD-10-CM | POA: Diagnosis not present

## 2016-07-27 DIAGNOSIS — C9110 Chronic lymphocytic leukemia of B-cell type not having achieved remission: Secondary | ICD-10-CM | POA: Diagnosis not present

## 2016-07-27 DIAGNOSIS — R5383 Other fatigue: Secondary | ICD-10-CM | POA: Diagnosis not present

## 2016-08-09 ENCOUNTER — Other Ambulatory Visit: Payer: Self-pay | Admitting: Urology

## 2016-08-09 DIAGNOSIS — R350 Frequency of micturition: Secondary | ICD-10-CM | POA: Diagnosis not present

## 2016-08-09 DIAGNOSIS — R8290 Unspecified abnormal findings in urine: Secondary | ICD-10-CM | POA: Diagnosis not present

## 2016-08-31 ENCOUNTER — Inpatient Hospital Stay
Admission: EM | Admit: 2016-08-31 | Discharge: 2016-09-07 | DRG: 308 | Disposition: A | Discharge status: Skilled Nursing Facility | Payer: Medicare Other | Attending: Internal Medicine | Admitting: Internal Medicine

## 2016-08-31 ENCOUNTER — Emergency Department: Payer: Medicare Other

## 2016-08-31 ENCOUNTER — Encounter: Payer: Self-pay | Admitting: Emergency Medicine

## 2016-08-31 DIAGNOSIS — I110 Hypertensive heart disease with heart failure: Secondary | ICD-10-CM | POA: Diagnosis present

## 2016-08-31 DIAGNOSIS — J9601 Acute respiratory failure with hypoxia: Secondary | ICD-10-CM | POA: Diagnosis not present

## 2016-08-31 DIAGNOSIS — I4891 Unspecified atrial fibrillation: Secondary | ICD-10-CM

## 2016-08-31 DIAGNOSIS — R2681 Unsteadiness on feet: Secondary | ICD-10-CM

## 2016-08-31 DIAGNOSIS — I10 Essential (primary) hypertension: Secondary | ICD-10-CM | POA: Diagnosis not present

## 2016-08-31 DIAGNOSIS — Z91048 Other nonmedicinal substance allergy status: Secondary | ICD-10-CM

## 2016-08-31 DIAGNOSIS — E876 Hypokalemia: Secondary | ICD-10-CM | POA: Diagnosis not present

## 2016-08-31 DIAGNOSIS — R531 Weakness: Secondary | ICD-10-CM

## 2016-08-31 DIAGNOSIS — Z791 Long term (current) use of non-steroidal anti-inflammatories (NSAID): Secondary | ICD-10-CM

## 2016-08-31 DIAGNOSIS — I480 Paroxysmal atrial fibrillation: Principal | ICD-10-CM | POA: Diagnosis present

## 2016-08-31 DIAGNOSIS — R0600 Dyspnea, unspecified: Secondary | ICD-10-CM

## 2016-08-31 DIAGNOSIS — Z8673 Personal history of transient ischemic attack (TIA), and cerebral infarction without residual deficits: Secondary | ICD-10-CM

## 2016-08-31 DIAGNOSIS — J810 Acute pulmonary edema: Secondary | ICD-10-CM

## 2016-08-31 DIAGNOSIS — I509 Heart failure, unspecified: Secondary | ICD-10-CM | POA: Diagnosis not present

## 2016-08-31 DIAGNOSIS — I340 Nonrheumatic mitral (valve) insufficiency: Secondary | ICD-10-CM | POA: Diagnosis not present

## 2016-08-31 DIAGNOSIS — M109 Gout, unspecified: Secondary | ICD-10-CM | POA: Diagnosis present

## 2016-08-31 DIAGNOSIS — I252 Old myocardial infarction: Secondary | ICD-10-CM

## 2016-08-31 DIAGNOSIS — Z882 Allergy status to sulfonamides status: Secondary | ICD-10-CM

## 2016-08-31 DIAGNOSIS — Z87891 Personal history of nicotine dependence: Secondary | ICD-10-CM | POA: Diagnosis not present

## 2016-08-31 DIAGNOSIS — Z885 Allergy status to narcotic agent status: Secondary | ICD-10-CM

## 2016-08-31 DIAGNOSIS — Z7901 Long term (current) use of anticoagulants: Secondary | ICD-10-CM

## 2016-08-31 DIAGNOSIS — E782 Mixed hyperlipidemia: Secondary | ICD-10-CM | POA: Diagnosis present

## 2016-08-31 DIAGNOSIS — Z96651 Presence of right artificial knee joint: Secondary | ICD-10-CM | POA: Diagnosis present

## 2016-08-31 DIAGNOSIS — R0602 Shortness of breath: Secondary | ICD-10-CM | POA: Diagnosis not present

## 2016-08-31 DIAGNOSIS — I5033 Acute on chronic diastolic (congestive) heart failure: Secondary | ICD-10-CM | POA: Diagnosis present

## 2016-08-31 DIAGNOSIS — M6281 Muscle weakness (generalized): Secondary | ICD-10-CM | POA: Diagnosis not present

## 2016-08-31 DIAGNOSIS — E785 Hyperlipidemia, unspecified: Secondary | ICD-10-CM | POA: Diagnosis not present

## 2016-08-31 DIAGNOSIS — I4819 Other persistent atrial fibrillation: Secondary | ICD-10-CM | POA: Diagnosis present

## 2016-08-31 LAB — BASIC METABOLIC PANEL
ANION GAP: 9 (ref 5–15)
BUN: 15 mg/dL (ref 6–20)
CHLORIDE: 104 mmol/L (ref 101–111)
CO2: 25 mmol/L (ref 22–32)
Calcium: 8.9 mg/dL (ref 8.9–10.3)
Creatinine, Ser: 0.88 mg/dL (ref 0.44–1.00)
GFR calc non Af Amer: 57 mL/min — ABNORMAL LOW (ref >60)
Glucose, Bld: 95 mg/dL (ref 65–99)
POTASSIUM: 3.6 mmol/L (ref 3.5–5.1)
SODIUM: 138 mmol/L (ref 135–145)

## 2016-08-31 LAB — URINALYSIS, COMPLETE (UACMP) WITH MICROSCOPIC
BACTERIA UA: NONE SEEN
BILIRUBIN URINE: NEGATIVE
Glucose, UA: NEGATIVE mg/dL
HGB URINE DIPSTICK: NEGATIVE
KETONES UR: NEGATIVE mg/dL
NITRITE: NEGATIVE
Protein, ur: 30 mg/dL — AB
Specific Gravity, Urine: 1.019 (ref 1.005–1.030)
pH: 5 (ref 5.0–8.0)

## 2016-08-31 LAB — CBC
HEMATOCRIT: 38.3 % (ref 35.0–47.0)
HEMOGLOBIN: 12.6 g/dL (ref 12.0–16.0)
MCH: 27.8 pg (ref 26.0–34.0)
MCHC: 33 g/dL (ref 32.0–36.0)
MCV: 84.3 fL (ref 80.0–100.0)
Platelets: 178 10*3/uL (ref 150–440)
RBC: 4.54 MIL/uL (ref 3.80–5.20)
RDW: 17.3 % — ABNORMAL HIGH (ref 11.5–14.5)
WBC: 9.3 10*3/uL (ref 3.6–11.0)

## 2016-08-31 LAB — LACTIC ACID, PLASMA
LACTIC ACID, VENOUS: 2.2 mmol/L — AB (ref 0.5–1.9)
Lactic Acid, Venous: 2.1 mmol/L (ref 0.5–1.9)

## 2016-08-31 LAB — TROPONIN I: Troponin I: <0.03 ng/mL (ref <0.03)

## 2016-08-31 LAB — MAGNESIUM: MAGNESIUM: 2.1 mg/dL (ref 1.7–2.4)

## 2016-08-31 LAB — BRAIN NATRIURETIC PEPTIDE: B Natriuretic Peptide: 351 pg/mL — ABNORMAL HIGH (ref 0.0–100.0)

## 2016-08-31 MED ORDER — DILTIAZEM HCL 100 MG IV SOLR
5.0000 mg/h | INTRAVENOUS | Status: DC
  Administered 2016-08-31: 5 mg/h via INTRAVENOUS
  Filled 2016-08-31 (×2): qty 100

## 2016-08-31 MED ORDER — METOPROLOL SUCCINATE ER 50 MG PO TB24
25.0000 mg | ORAL_TABLET | Freq: Every day | ORAL | Status: DC
  Filled 2016-08-31: qty 1

## 2016-08-31 MED ORDER — METOPROLOL TARTRATE 5 MG/5ML IV SOLN
2.5000 mg | INTRAVENOUS | Status: AC | PRN
  Administered 2016-08-31 (×3): 2.5 mg via INTRAVENOUS
  Filled 2016-08-31: qty 5

## 2016-08-31 MED ORDER — SODIUM CHLORIDE 0.9 % IV BOLUS (SEPSIS): 500.0000 mL | Freq: Once | INTRAVENOUS | Status: DC

## 2016-08-31 MED ORDER — METOPROLOL TARTRATE 5 MG/5ML IV SOLN
2.5000 mg | Freq: Once | INTRAVENOUS | Status: AC
  Administered 2016-08-31: 2.5 mg via INTRAVENOUS
  Filled 2016-08-31: qty 5

## 2016-08-31 MED ORDER — DILTIAZEM LOAD VIA INFUSION
15.0000 mg | Freq: Once | INTRAVENOUS | Status: AC
  Administered 2016-08-31: 15 mg via INTRAVENOUS
  Filled 2016-08-31: qty 15

## 2016-08-31 MED ORDER — SODIUM CHLORIDE 0.9 % IV SOLN
1.0000 g | Freq: Once | INTRAVENOUS | Status: AC
  Administered 2016-08-31: 1 g via INTRAVENOUS
  Filled 2016-08-31: qty 10

## 2016-08-31 MED ORDER — SODIUM CHLORIDE 0.9 % IV SOLN
1.0000 g | Freq: Once | INTRAVENOUS | Status: DC
  Filled 2016-08-31: qty 10

## 2016-08-31 MED ORDER — DIGOXIN 0.25 MG/ML IJ SOLN
0.2500 mg | Freq: Once | INTRAMUSCULAR | Status: AC
  Administered 2016-08-31: 0.25 mg via INTRAVENOUS
  Filled 2016-08-31: qty 2

## 2016-08-31 NOTE — ED Notes (Signed)
Admitting MD at the bedside. Ed MD ordering digoxin ivp. Pt is doing some better after calium but still needing hr control meds.

## 2016-08-31 NOTE — ED Notes (Addendum)
O2 dropped to 88% appling o2 2 L via [place] Per MD. MD madeaware pt no longer take po metroprol because dropped BP. BP madeaaawre of BP prior given this last dose of IVP meds. MD wanting to slow the HR. Will continue to monitor. No Shob or CP.

## 2016-08-31 NOTE — ED Provider Notes (Signed)
[facility] Emergency Department Provider Note    First MD Initiated Contact with Patient 08/31/16 1657     (approximate)  I have reviewed the triage vital signs and the nursing notes.   HISTORY  Chief Complaint Weakness    HPI Victoria Contreras is a 82 y.o. female with a history of A. fib as well as CLL, hypertension and hyperlipidemia and recent urinary tract infection status post antibiotics presents with acute onset shortness of breath and palpitations that started this morning after the patient ambulated to the bathroom. States that she's had persistent shortness of breath since this morning. Denies any chest pain or pressure. States that she feels generalized weakness. [facility] and was found to be in A. fib with RVR and was sent to the ER. Patient denies any worsening orthopnea. Denies any fevers. She is on Xarelto. States that she took all of her medications as directed.   Past Medical History:  Diagnosis Date  . Atrial fibrillation ([place])   . Cataracts, both eyes   . CLL (chronic lymphocytic leukemia) ([place])   . CLL (chronic lymphoid leukemia) in relapse ([place]) 04/17/2015  . CVA (cerebral infarction)   . Frequent nosebleeds   . Gout   . Heart attack   . Heart burn   . Hemangiopericytoma   . HLD (hyperlipidemia)   . HTN (hypertension)   . UTI (lower urinary tract infection)    Family History  Problem Relation Age of Onset  . Melanoma Daughter 14  . Kidney disease Neg Hx   . Bladder Cancer Neg Hx    Past Surgical History:  Procedure Laterality Date  . APPENDECTOMY    . BREAST LUMPECTOMY    . CATARACT EXTRACTION BILATERAL W/ ANTERIOR VITRECTOMY     right/left eye  . CHOLECYSTECTOMY    . DILATION AND CURETTAGE OF UTERUS    . hemangiopericytoma resection    . TONSILLECTOMY    . TOTAL KNEE ARTHROPLASTY     right knee   Patient Active Problem List   Diagnosis Date Noted  . Dysuria 11/06/2015  . Cystocele, grade 2  11/06/2015  . Atrophic vaginitis 11/06/2015  . CLL (chronic lymphoid leukemia) in relapse ([place]) 04/17/2015  . 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      Prior to Admission medications   Medication Sig Start Date End Date Taking? Authorizing Provider  acetaminophen (TYLENOL) 500 MG tablet Take 500 mg by mouth every 4 (four) hours as needed for mild pain or fever.    Historical Provider, MD  acyclovir (ZOVIRAX) 400 MG tablet 400 mg 1 day or 1 dose. Reported on 11/05/2015    Historical Provider, MD  allopurinol (ZYLOPRIM) 300 MG tablet Take 300 mg by mouth daily.    Historical Provider, MD  co-enzyme Q-10 30 MG capsule Take 100 mg by mouth 1 day or 1 dose. In am    Historical Provider, MD  ESTRACE VAGINAL 0.1 MG/GM vaginal cream USE AS INSTRUCTED BY YOUR PRESCRIBER 08/09/16   Nori Riis, PA-C  hydrochlorothiazide (HYDRODIURIL) 25 MG tablet Take 25 mg by mouth every morning.    Historical Provider, MD  metoprolol succinate (TOPROL-XL) 25 MG 24 hr tablet Take 12.5 mg by mouth 2 (two) times daily. Reported on 11/05/2015    Historical Provider, MD  Multiple Vitamins-Minerals (MULTIVITAMIN ADULT) TABS Take 1 tablet by mouth.    Historical Provider, MD  naproxen sodium (ANAPROX) 220 MG tablet Take 440 mg by mouth 2 (two) times  daily with a meal. Reported on 11/05/2015    Historical Provider, MD  phenazopyridine (PYRIDIUM) 100 MG tablet Reported on 02/05/2016 10/13/15   Historical Provider, MD  potassium chloride SA (K-DUR,KLOR-CON) 20 MEQ tablet Take 20 mEq by mouth 2 (two) times daily.    Historical Provider, MD  rivaroxaban (XARELTO) 20 MG TABS tablet Take 20 mg by mouth daily with supper.    Historical Provider, MD  simvastatin (ZOCOR) 20 MG tablet Take 20 mg by mouth daily.    Historical Provider, MD  sulfamethoxazole-trimethoprim (BACTRIM DS,SEPTRA DS) 800-160 MG tablet Reported on 02/05/2016 10/23/15   Historical Provider, MD    Allergies Codeine; Sulfa antibiotics; and Tape    Social History Social  History  Substance Use Topics  . Smoking status: Former Smoker    Types: Cigarettes  . Smokeless tobacco: Never Used     Comment: quit 1990  . Alcohol use No    Review of Systems Patient denies headaches, rhinorrhea, blurry vision, numbness, shortness of breath, chest pain, edema, cough, abdominal pain, nausea, vomiting, diarrhea, dysuria, fevers, rashes or hallucinations unless otherwise stated above in HPI. ____________________________________________   PHYSICAL EXAM:  VITAL SIGNS: Vitals:   08/31/16 1653  BP: 139/87  Pulse: 81  Resp: 18  Temp: 97.5 F (36.4 C)    Constitutional: Alert and oriented. Well appearing and in no acute distress. Eyes: Conjunctivae are normal. PERRL. EOMI. Head: Atraumatic. Nose: No congestion/rhinnorhea. Mouth/Throat: Mucous membranes are moist.  Oropharynx non-erythematous. Neck: No stridor. Painless ROM. No cervical spine tenderness to palpation Hematological/Lymphatic/Immunilogical: No cervical lymphadenopathy. Cardiovascular: tachy irregularly irregular rhythm. Grossly normal heart sounds.  Good peripheral circulation. Respiratory: Normal respiratory effort.  No retractions. Lungs CTAB. Gastrointestinal: Soft and nontender. No distention. No abdominal bruits. No CVA tenderness. Musculoskeletal: No lower extremity tenderness, 1+ bilat edema.  No joint effusions. Neurologic:  Normal speech and language. No gross focal neurologic deficits are appreciated. No gait instability. Skin:  Skin is warm, dry and intact. No rash noted. Psychiatric: Mood and affect are normal. Speech and behavior are normal.  ____________________________________________   LABS (all labs ordered are listed, but only abnormal results are displayed)  Results for orders placed or performed during the hospital encounter of 08/31/16 (from the past 24 hour(s))  Basic metabolic panel     Status: Abnormal   Collection Time: 08/31/16  6:11 PM  Result Value Ref Range    Sodium 138 135 - 145 mmol/L   Potassium 3.6 3.5 - 5.1 mmol/L   Chloride 104 101 - 111 mmol/L   CO2 25 22 - 32 mmol/L   Glucose, Bld 95 65 - 99 mg/dL   BUN 15 6 - 20 mg/dL   Creatinine, Ser 0.88 0.44 - 1.00 mg/dL   Calcium 8.9 8.9 - 10.3 mg/dL   GFR calc non Af Amer 57 (L) >60 mL/min   GFR calc Af Amer >60 >60 mL/min   Anion gap 9 5 - 15  CBC     Status: Abnormal   Collection Time: 08/31/16  6:11 PM  Result Value Ref Range   WBC 9.3 3.6 - 11.0 K/uL   RBC 4.54 3.80 - 5.20 MIL/uL   Hemoglobin 12.6 12.0 - 16.0 g/dL   HCT 38.3 35.0 - 47.0 %   MCV 84.3 80.0 - 100.0 fL   MCH 27.8 26.0 - 34.0 pg   MCHC 33.0 32.0 - 36.0 g/dL   RDW 17.3 (H) 11.5 - 14.5 %   Platelets 178 150 - 440 K/uL  Magnesium     Status: None   Collection Time: 08/31/16  6:11 PM  Result Value Ref Range   Magnesium 2.1 1.7 - 2.4 mg/dL  Lactic acid, plasma     Status: Abnormal   Collection Time: 08/31/16  6:11 PM  Result Value Ref Range   Lactic Acid, Venous 2.2 (HH) 0.5 - 1.9 mmol/L  Troponin I     Status: None   Collection Time: 08/31/16  6:11 PM  Result Value Ref Range   Troponin I <0.03 <0.03 ng/mL  Brain natriuretic peptide     Status: Abnormal   Collection Time: 08/31/16  6:11 PM  Result Value Ref Range   B Natriuretic Peptide 351.0 (H) 0.0 - 100.0 pg/mL  Urinalysis, Complete w Microscopic     Status: Abnormal   Collection Time: 08/31/16  7:53 PM  Result Value Ref Range   Color, Urine YELLOW (A) YELLOW   APPearance HAZY (A) CLEAR   Specific Gravity, Urine 1.019 1.005 - 1.030   pH 5.0 5.0 - 8.0   Glucose, UA NEGATIVE NEGATIVE mg/dL   Hgb urine dipstick NEGATIVE NEGATIVE   Bilirubin Urine NEGATIVE NEGATIVE   Ketones, ur NEGATIVE NEGATIVE mg/dL   Protein, ur 30 (A) NEGATIVE mg/dL   Nitrite NEGATIVE NEGATIVE   Leukocytes, UA LARGE (A) NEGATIVE   RBC / HPF 0-5 0 - 5 RBC/hpf   WBC, UA 6-30 0 - 5 WBC/hpf   Bacteria, UA NONE SEEN NONE SEEN   Squamous Epithelial / LPF 6-30 (A) NONE SEEN   Mucous PRESENT      ____________________________________________  EKG My review and personal interpretation at Time: 16:57   Indication: weakness  Rate: 140  Rhythm: afib with RVR Axis: normal Other: non specific st and t wave changes, likely related to rate ____________________________________________  RADIOLOGY  I personally reviewed all radiographic images ordered to evaluate for the above acute complaints and reviewed radiology reports and findings.  These findings were personally discussed with the patient.  Please see medical record for radiology report. ____________________________________________   PROCEDURES  Procedure(s) performed:  Procedures    Critical Care performed: yes CRITICAL CARE Performed by: Merlyn Lot   Total critical care time: 45 minutes  Critical care time was exclusive of separately billable procedures and treating other patients.  Critical care was necessary to treat or prevent imminent or life-threatening deterioration.  Critical care was time spent personally by me on the following activities: development of treatment plan with patient and/or surrogate as well as nursing, discussions with consultants, evaluation of patient's response to treatment, examination of patient, obtaining history from patient or surrogate, ordering and performing treatments and interventions, ordering and review of laboratory studies, ordering and review of radiographic studies, pulse oximetry and re-evaluation of patient's condition.  ____________________________________________   INITIAL IMPRESSION / ASSESSMENT AND PLAN / ED COURSE  Pertinent labs & imaging results that were available during my care of the patient were reviewed by me and considered in my medical decision making (see chart for details).  DDX:  afib with RVR, dehydration, acs, sepsis, pna, electrolyte abn  Victoria Contreras is a 82 y.o. who presents to the ED with weakness and A. fib with RVR. Patient placed on  monitor immediately upon arrival. Patient fortunately with normal tensive blood pressure and good mentation. Only complaint at this time is generalized weakness. Based on her RVR, age and multiple risk factors will attempt gentle IV fluid bolus with Cardizem bolus and infusion. We'll check blood work to  evaluate for any O abnormality. We'll order chest x-ray as well as urinalysis to evaluate for any evidence of congestive heart failure versus pneumonia or uti.  The patient will be placed on continuous pulse oximetry and telemetry for monitoring.  Laboratory evaluation will be sent to evaluate for the above complaints.     Clinical Course as of Aug 31 2058  Tue Aug 31, 2016  1745 DG Chest Portable 1 View [PR]  [id number] CXR with evidence of mild CHF  [PR]  1906 Patient with mildly elevated lactic acid but no leukocytosis. Remains afebrile. Heart rate is improving on diltiazem drip. Awaiting electrolytes as well as BNP.  [PR]  1936 Patient reassessed. Still with persistent tachycardia. We'll give Lopressor bolus and reassess.   I spoke with Dr. Lavetta Nielsen who agrees to evaluate patient for admission to hospital.  Patient remains pleasant and denies any CP at this time.  Have discussed with the patient and available family all diagnostics and treatments performed thus far and all questions were answered to the best of my ability. The patient demonstrates understanding and agreement with plan.  [PR]    Clinical Course User Index [PR] Merlyn Lot, MD     ____________________________________________   FINAL CLINICAL IMPRESSION(S) / ED DIAGNOSES  Final diagnoses:  Atrial fibrillation with rapid ventricular response ([place])  Acute pulmonary edema ([facility])  Generalized weakness      NEW MEDICATIONS STARTED DURING THIS VISIT:  New Prescriptions   No medications on file     Note:  This document was prepared using Dragon voice recognition software and may include unintentional dictation errors.      Merlyn Lot, MD 08/31/16 2101

## 2016-08-31 NOTE — ED Notes (Signed)
Md made aware of HR and BP. Admitting MD paged last dose of metoprolol given.

## 2016-08-31 NOTE — ED Notes (Signed)
Pt drinking water. HR not going down MD ordering IVP metroprol to help. Pt up to bathroom. Pt denies SHOB or CP.

## 2016-08-31 NOTE — ED Triage Notes (Signed)
Patient here from [facility] walk-in with shortness of breath and weakness. Started around 6am this morning. Denies dizziness. Had 2 episodes of diarrhea after lunch.

## 2016-09-01 ENCOUNTER — Observation Stay
Admit: 2016-09-01 | Discharge: 2016-09-01 | Disposition: A | Discharge status: Home / Self Care | Payer: Medicare Other | Attending: Family Medicine | Admitting: Family Medicine

## 2016-09-01 DIAGNOSIS — N390 Urinary tract infection, site not specified: Secondary | ICD-10-CM | POA: Diagnosis not present

## 2016-09-01 DIAGNOSIS — Z885 Allergy status to narcotic agent status: Secondary | ICD-10-CM | POA: Diagnosis not present

## 2016-09-01 DIAGNOSIS — I480 Paroxysmal atrial fibrillation: Secondary | ICD-10-CM | POA: Diagnosis present

## 2016-09-01 DIAGNOSIS — I5031 Acute diastolic (congestive) heart failure: Secondary | ICD-10-CM | POA: Diagnosis not present

## 2016-09-01 DIAGNOSIS — R0602 Shortness of breath: Secondary | ICD-10-CM | POA: Diagnosis not present

## 2016-09-01 DIAGNOSIS — R4189 Other symptoms and signs involving cognitive functions and awareness: Secondary | ICD-10-CM | POA: Diagnosis not present

## 2016-09-01 DIAGNOSIS — I509 Heart failure, unspecified: Secondary | ICD-10-CM | POA: Diagnosis not present

## 2016-09-01 DIAGNOSIS — R531 Weakness: Secondary | ICD-10-CM | POA: Diagnosis not present

## 2016-09-01 DIAGNOSIS — Z87891 Personal history of nicotine dependence: Secondary | ICD-10-CM | POA: Diagnosis not present

## 2016-09-01 DIAGNOSIS — E785 Hyperlipidemia, unspecified: Secondary | ICD-10-CM | POA: Diagnosis not present

## 2016-09-01 DIAGNOSIS — E876 Hypokalemia: Secondary | ICD-10-CM | POA: Diagnosis not present

## 2016-09-01 DIAGNOSIS — I517 Cardiomegaly: Secondary | ICD-10-CM | POA: Diagnosis not present

## 2016-09-01 DIAGNOSIS — R2681 Unsteadiness on feet: Secondary | ICD-10-CM | POA: Diagnosis not present

## 2016-09-01 DIAGNOSIS — J90 Pleural effusion, not elsewhere classified: Secondary | ICD-10-CM | POA: Diagnosis not present

## 2016-09-01 DIAGNOSIS — J9600 Acute respiratory failure, unspecified whether with hypoxia or hypercapnia: Secondary | ICD-10-CM | POA: Diagnosis not present

## 2016-09-01 DIAGNOSIS — I340 Nonrheumatic mitral (valve) insufficiency: Secondary | ICD-10-CM | POA: Diagnosis present

## 2016-09-01 DIAGNOSIS — J9601 Acute respiratory failure with hypoxia: Secondary | ICD-10-CM | POA: Diagnosis present

## 2016-09-01 DIAGNOSIS — M109 Gout, unspecified: Secondary | ICD-10-CM | POA: Diagnosis present

## 2016-09-01 DIAGNOSIS — I252 Old myocardial infarction: Secondary | ICD-10-CM | POA: Diagnosis not present

## 2016-09-01 DIAGNOSIS — I10 Essential (primary) hypertension: Secondary | ICD-10-CM | POA: Diagnosis not present

## 2016-09-01 DIAGNOSIS — E782 Mixed hyperlipidemia: Secondary | ICD-10-CM | POA: Diagnosis present

## 2016-09-01 DIAGNOSIS — I5033 Acute on chronic diastolic (congestive) heart failure: Secondary | ICD-10-CM | POA: Diagnosis present

## 2016-09-01 DIAGNOSIS — I110 Hypertensive heart disease with heart failure: Secondary | ICD-10-CM | POA: Diagnosis present

## 2016-09-01 DIAGNOSIS — Z91048 Other nonmedicinal substance allergy status: Secondary | ICD-10-CM | POA: Diagnosis not present

## 2016-09-01 DIAGNOSIS — I4891 Unspecified atrial fibrillation: Secondary | ICD-10-CM | POA: Diagnosis not present

## 2016-09-01 DIAGNOSIS — I5040 Unspecified combined systolic (congestive) and diastolic (congestive) heart failure: Secondary | ICD-10-CM | POA: Diagnosis not present

## 2016-09-01 DIAGNOSIS — M6281 Muscle weakness (generalized): Secondary | ICD-10-CM | POA: Diagnosis not present

## 2016-09-01 DIAGNOSIS — Z7901 Long term (current) use of anticoagulants: Secondary | ICD-10-CM | POA: Diagnosis not present

## 2016-09-01 DIAGNOSIS — Z96651 Presence of right artificial knee joint: Secondary | ICD-10-CM | POA: Diagnosis present

## 2016-09-01 DIAGNOSIS — Z8673 Personal history of transient ischemic attack (TIA), and cerebral infarction without residual deficits: Secondary | ICD-10-CM | POA: Diagnosis not present

## 2016-09-01 DIAGNOSIS — Z882 Allergy status to sulfonamides status: Secondary | ICD-10-CM | POA: Diagnosis not present

## 2016-09-01 DIAGNOSIS — Z791 Long term (current) use of non-steroidal anti-inflammatories (NSAID): Secondary | ICD-10-CM | POA: Diagnosis not present

## 2016-09-01 LAB — GLUCOSE, CAPILLARY
GLUCOSE-CAPILLARY: 123 mg/dL — AB (ref 65–99)
Glucose-Capillary: 106 mg/dL — ABNORMAL HIGH (ref 65–99)

## 2016-09-01 LAB — LIPID PANEL
CHOL/HDL RATIO: 3 ratio
Cholesterol: 90 mg/dL (ref 0–200)
HDL: 30 mg/dL — AB (ref >40)
LDL CALC: 42 mg/dL (ref 0–99)
Triglycerides: 89 mg/dL (ref <150)
VLDL: 18 mg/dL (ref 0–40)

## 2016-09-01 LAB — BASIC METABOLIC PANEL
Anion gap: 9 (ref 5–15)
BUN: 15 mg/dL (ref 6–20)
CHLORIDE: 104 mmol/L (ref 101–111)
CO2: 25 mmol/L (ref 22–32)
Calcium: 8.9 mg/dL (ref 8.9–10.3)
Creatinine, Ser: 0.86 mg/dL (ref 0.44–1.00)
GFR calc non Af Amer: 58 mL/min — ABNORMAL LOW (ref >60)
Glucose, Bld: 100 mg/dL — ABNORMAL HIGH (ref 65–99)
POTASSIUM: 3.4 mmol/L — AB (ref 3.5–5.1)
SODIUM: 138 mmol/L (ref 135–145)

## 2016-09-01 LAB — ECHOCARDIOGRAM COMPLETE
Height: 69 in
Weight: 3375.68 oz

## 2016-09-01 LAB — CBC
HEMATOCRIT: 37.2 % (ref 35.0–47.0)
Hemoglobin: 12.2 g/dL (ref 12.0–16.0)
MCH: 27.4 pg (ref 26.0–34.0)
MCHC: 32.7 g/dL (ref 32.0–36.0)
MCV: 83.8 fL (ref 80.0–100.0)
Platelets: 172 10*3/uL (ref 150–440)
RBC: 4.44 MIL/uL (ref 3.80–5.20)
RDW: 17.1 % — ABNORMAL HIGH (ref 11.5–14.5)
WBC: 8.2 10*3/uL (ref 3.6–11.0)

## 2016-09-01 LAB — TSH: TSH: 2.108 u[IU]/mL (ref 0.350–4.500)

## 2016-09-01 MED ORDER — HYDROCHLOROTHIAZIDE 25 MG PO TABS
25.0000 mg | ORAL_TABLET | Freq: Every morning | ORAL | Status: DC
  Filled 2016-09-01: qty 1

## 2016-09-01 MED ORDER — FUROSEMIDE 10 MG/ML IJ SOLN
20.0000 mg | Freq: Two times a day (BID) | INTRAMUSCULAR | Status: DC
  Administered 2016-09-01 – 2016-09-04 (×5): 20 mg via INTRAVENOUS
  Filled 2016-09-01 (×5): qty 2

## 2016-09-01 MED ORDER — NAPROXEN SODIUM 275 MG PO TABS: 440.0000 mg | ORAL_TABLET | Freq: Two times a day (BID) | ORAL | Status: DC

## 2016-09-01 MED ORDER — SIMVASTATIN 20 MG PO TABS
20.0000 mg | ORAL_TABLET | Freq: Every day | ORAL | Status: DC
  Administered 2016-09-01 – 2016-09-07 (×7): 20 mg via ORAL
  Filled 2016-09-01 (×7): qty 1

## 2016-09-01 MED ORDER — NAPROXEN 250 MG PO TABS
375.0000 mg | ORAL_TABLET | Freq: Two times a day (BID) | ORAL | Status: DC
  Administered 2016-09-01: 375 mg via ORAL
  Filled 2016-09-01: qty 2

## 2016-09-01 MED ORDER — ORAL CARE MOUTH RINSE
15.0000 mL | Freq: Two times a day (BID) | OROMUCOSAL | Status: DC
  Administered 2016-09-01 – 2016-09-07 (×8): 15 mL via OROMUCOSAL

## 2016-09-01 MED ORDER — ACETAMINOPHEN 325 MG PO TABS: 650.0000 mg | ORAL_TABLET | ORAL | Status: DC | PRN

## 2016-09-01 MED ORDER — ONDANSETRON HCL 4 MG/2ML IJ SOLN: 4.0000 mg | Freq: Four times a day (QID) | INTRAMUSCULAR | Status: DC | PRN

## 2016-09-01 MED ORDER — POTASSIUM CHLORIDE CRYS ER 20 MEQ PO TBCR
40.0000 meq | EXTENDED_RELEASE_TABLET | Freq: Once | ORAL | Status: AC
  Administered 2016-09-01: 40 meq via ORAL
  Filled 2016-09-01: qty 2

## 2016-09-01 MED ORDER — METOPROLOL TARTRATE 50 MG PO TABS
50.0000 mg | ORAL_TABLET | Freq: Two times a day (BID) | ORAL | Status: DC
  Administered 2016-09-01 (×2): 50 mg via ORAL
  Filled 2016-09-01 (×2): qty 1

## 2016-09-01 MED ORDER — DIGOXIN 0.25 MG/ML IJ SOLN
0.2500 mg | Freq: Once | INTRAMUSCULAR | Status: AC
  Administered 2016-09-01: 0.25 mg via INTRAVENOUS
  Filled 2016-09-01: qty 2

## 2016-09-01 MED ORDER — RIVAROXABAN 20 MG PO TABS
20.0000 mg | ORAL_TABLET | Freq: Every day | ORAL | Status: DC
  Administered 2016-09-01 – 2016-09-07 (×7): 20 mg via ORAL
  Filled 2016-09-01: qty 2
  Filled 2016-09-01: qty 1
  Filled 2016-09-01: qty 2
  Filled 2016-09-01 (×2): qty 1
  Filled 2016-09-01: qty 2
  Filled 2016-09-01: qty 1

## 2016-09-01 MED ORDER — DILTIAZEM HCL 100 MG IV SOLR
5.0000 mg/h | INTRAVENOUS | Status: DC
  Administered 2016-09-01 (×4): 15 mg/h via INTRAVENOUS
  Administered 2016-09-02: 10 mg/h via INTRAVENOUS
  Filled 2016-09-01 (×7): qty 100

## 2016-09-01 MED ORDER — ALLOPURINOL 100 MG PO TABS
300.0000 mg | ORAL_TABLET | Freq: Every day | ORAL | Status: DC
  Administered 2016-09-01 – 2016-09-07 (×7): 300 mg via ORAL
  Filled 2016-09-01 (×7): qty 3

## 2016-09-01 NOTE — Plan of Care (Addendum)
Problem: Bowel/Gastric: Goal: Will not experience complications related to bowel motility Outcome: Progressing BP 122/74   Pulse 98   Temp 98.2 F (36.8 C) (Oral)   Resp 16   Ht 5\' 9"  (1.753 m)   Wt 95.7 kg (210 lb 15.7 oz)   SpO2 91%   BMI 31.16 kg/m   Cont on Cardizem drip  HR in the 110s  Unable to give lasix, sbp too soft less than 110

## 2016-09-01 NOTE — Progress Notes (Signed)
Chaplain rounded the unit to provide a compassionate presence and support to the patient and family. Minerva Fester [phone]

## 2016-09-01 NOTE — ED Notes (Signed)
Pt continues to deny SHOB and CP. Pt is resting in bed. No distress noted. Continuing to monitor. HR. ED MD and Admitting MD aware.

## 2016-09-01 NOTE — Consult Note (Signed)
[facility] Consultation Note  Patient ID: Victoria Contreras, MRN: [id number], DOB/AGE: [birth year] 82 y.o. Admit date: 08/31/2016   Date of Consult: 09/01/2016 Primary Physician:  Gilford Rile  Primary Cardiologist: Ubaldo Glassing  Chief Complaint:  Chief Complaint  Patient presents with  . Weakness   Reason for Consult: atrial fibrillation  HPI: 82 y.o. female with the known essential hypertension mixed hyperlipidemia and valvular heart disease with paroxysmal nonvalvular atrial fibrillation previously on appropriate medication with the acute onset of shortness of breath weakness fatigue and palpitations with the concerns of heart failure. The patient was seen in the emergency room and had atrial fibrillation with rapid ventricular rate likely causing her issues. Chest x-ray had showed some pulmonary congestion and little pleural effusions but no evidence of infection. The patient also has had no evidence of myocardial infarction with this extra stress with a normal troponin. The patient's previously been on the appropriate medication management including Xarelto for further risk reduction in stroke without bleeding complications. The patient does have abnormal thyroid at this time but not sure whether this is contributing to above. There currently is no apparent other primary cause  Past Medical History:  Diagnosis Date  . Atrial fibrillation ([place])   . Cataracts, both eyes   . CLL (chronic lymphocytic leukemia) ([place])   . CLL (chronic lymphoid leukemia) in relapse ([place]) 04/17/2015  . CVA (cerebral infarction)   . Frequent nosebleeds   . Gout   . Heart attack   . Heart burn   . Hemangiopericytoma   . HLD (hyperlipidemia)   . HTN (hypertension)   . UTI (lower urinary tract infection)       Surgical History:  Past Surgical History:  Procedure Laterality Date  . APPENDECTOMY    . BREAST LUMPECTOMY    . CATARACT EXTRACTION BILATERAL W/ ANTERIOR VITRECTOMY     right/left eye  .  CHOLECYSTECTOMY    . DILATION AND CURETTAGE OF UTERUS    . hemangiopericytoma resection    . TONSILLECTOMY    . TOTAL KNEE ARTHROPLASTY     right knee     Home Meds: Prior to Admission medications   Medication Sig Start Date End Date Taking? Authorizing Provider  acetaminophen (TYLENOL) 500 MG tablet Take 500 mg by mouth every 4 (four) hours as needed for mild pain or fever.   Yes Historical Provider, MD  allopurinol (ZYLOPRIM) 300 MG tablet Take 300 mg by mouth daily.   Yes Historical Provider, MD  hydrochlorothiazide (HYDRODIURIL) 25 MG tablet Take 25 mg by mouth every morning.   Yes Historical Provider, MD  naproxen sodium (ANAPROX) 220 MG tablet Take 440 mg by mouth 2 (two) times daily with a meal. Reported on 11/05/2015   Yes Historical Provider, MD  phenazopyridine (PYRIDIUM) 100 MG tablet Take 100 mg by mouth 3 (three) times daily as needed. Reported on 02/05/2016 10/13/15  Yes Historical Provider, MD  rivaroxaban (XARELTO) 20 MG TABS tablet Take 20 mg by mouth daily with supper.   Yes Historical Provider, MD  simvastatin (ZOCOR) 20 MG tablet Take 20 mg by mouth daily.   Yes Historical Provider, MD  ESTRACE VAGINAL 0.1 MG/GM vaginal cream USE AS INSTRUCTED BY YOUR PRESCRIBER Patient not taking: Reported on 08/31/2016 08/09/16   Nori Riis, PA-C    Inpatient Medications:  . allopurinol  300 mg Oral Daily  . furosemide  20 mg Intravenous Q12H  . mouth rinse  15 mL Mouth Rinse BID  . potassium chloride  40 mEq Oral Once  . rivaroxaban  20 mg Oral Q supper  . simvastatin  20 mg Oral Daily   . diltiazem (CARDIZEM) infusion 15 mg/hr (09/01/16 1000)    Allergies:  Allergies  Allergen Reactions  . Codeine     Other reaction(s): Unknown  . Sulfa Antibiotics Other (See Comments)    Deathly sick   . Tape Itching    Other reaction(s): Itching of Skin    Social History   Social History  . Marital status: Widowed    Spouse name: N/A  . Number of children: N/A  . Years of  education: N/A   Occupational History  . Not on file.   Social History Main Topics  . Smoking status: Former Smoker    Types: Cigarettes  . Smokeless tobacco: Never Used     Comment: quit 1990  . Alcohol use No  . Drug use: No  . Sexual activity: Not on file   Other Topics Concern  . Not on file   Social History Narrative  . No narrative on file     Family History  Problem Relation Age of Onset  . Other Father     "brain hemorrhage"  . Melanoma Daughter 34  . Alzheimer's disease Sister   . Alzheimer's disease Brother   . Alzheimer's disease Sister   . Kidney disease Neg Hx   . Bladder Cancer Neg Hx      Review of Systems Positive for Shortness of breath palpitations Negative for: General:  chills, fever, night sweats or weight changes.  Cardiovascular: PND orthopnea syncope dizziness  Dermatological skin lesions rashes Respiratory: Cough congestion Urologic: Frequent urination urination at night and hematuria Abdominal: negative for nausea, vomiting, diarrhea, bright red blood per rectum, melena, or hematemesis Neurologic: negative for visual changes, and/or hearing changes  All other systems reviewed and are otherwise negative except as noted above.  Labs:  Recent Labs  08/31/16 1811  TROPONINI <0.03   Lab Results  Component Value Date   WBC 8.2 09/01/2016   HGB 12.2 09/01/2016   HCT 37.2 09/01/2016   MCV 83.8 09/01/2016   PLT 172 09/01/2016    Recent Labs Lab 09/01/16 0807  NA 138  K 3.4*  CL 104  CO2 25  BUN 15  CREATININE 0.86  CALCIUM 8.9  GLUCOSE 100*   Lab Results  Component Value Date   CHOL 90 09/01/2016   HDL 30 (L) 09/01/2016   LDLCALC 42 09/01/2016   TRIG 89 09/01/2016   No results found for: DDIMER  Radiology/Studies:  Dg Chest Portable 1 View  Result Date: 08/31/2016 CLINICAL DATA:  Shortness of breath and weakness. EXAM: PORTABLE CHEST 1 VIEW COMPARISON:  10/11/2014 FINDINGS: Moderate cardiac enlargement. Aortic  atherosclerosis noted. Pleural effusions identified right greater in left. There is mild diffuse interstitial edema. IMPRESSION: 1. Mild congestive heart failure. Electronically Signed   By: Kerby Moors M.D.   On: 08/31/2016 17:39    EKG: Atrial fibrillation with rapid ventricular rate  Weights: Filed Weights   08/31/16 1652 09/01/16 0335  Weight: 95.3 kg (210 lb) 95.7 kg (210 lb 15.7 oz)     Physical Exam: Blood pressure 108/75, pulse (!) 107, temperature 98.2 F (36.8 C), temperature source Oral, resp. rate 16, height 5\' 9"  (1.753 m), weight 95.7 kg (210 lb 15.7 oz), SpO2 92 %. Body mass index is 31.16 kg/m. General: Well developed, well nourished, in no acute distress. Head eyes ears nose throat: Normocephalic, atraumatic, sclera non-icteric,  no xanthomas, nares are without discharge. No apparent thyromegaly and/or mass  Lungs: Normal respiratory effort.  no wheezes, no rales, no rhonchi.  Heart: Irregular and rapid with normal S1 S2. no murmur gallop, no rub, PMI is normal size and placement, carotid upstroke normal without bruit, jugular venous pressure is normal Abdomen: Soft,   non-distended with normoactive bowel sounds. No hepatomegaly. No rebound/guarding. No obvious abdominal masses. Abdominal aorta is normal size without bruit Extremities: Trace edema. no cyanosis, no clubbing, no ulcers  Peripheral : 2+ bilateral upper extremity pulses, 2+ bilateral femoral pulses, 2+ bilateral dorsal pedal pulse Neuro: Alert and oriented. No facial asymmetry. No focal deficit. Moves all extremities spontaneously. Musculoskeletal: Normal muscle tone without kyphosis Psych:  Responds to questions appropriately with a normal affect.    Assessment: 82 year old female with essential hypertension makes hyperlipidemia and paroxysmal nonvalvular atrial fibrillation with rapid ventricular rate now slightly improved with diltiazem drip  Plan: 1. Reinstatement of metoprolol which had previously  held heart rate to a lower range with adjustments as necessary for heart rate between 60 and 90 bpm at rest 2. Continue Xarelto for further risk reduction in stroke with atrial fibrillation 3. Diltiazem drip until able to get oral medications with heart rate control and possible spontaneous conversion to normal rhythm 4. Echocardiogram for LV systolic dysfunction valvular heart disease contributing to above 5. High intensity cholesterol therapy for coronary artery disease risk factors 6. Further diagnostic testing and treatment options after above  Signed, Corey Skains M.D. [facility] 09/01/2016, 12:54 PM

## 2016-09-01 NOTE — Progress Notes (Signed)
[place] [facility] [place] NAME: Victoria Contreras    MR#:  [id number]  DATE OF BIRTH:  1925/01/18  SUBJECTIVE:  CHIEF COMPLAINT:   Chief Complaint  Patient presents with  . Weakness   Better SOB and cough. On O2 [place] 3 L. HR is at 120-130s with cardizem drip. REVIEW OF SYSTEMS:  Review of Systems  Constitutional: Positive for malaise/fatigue. Negative for chills and fever.  HENT: Negative for congestion.   Respiratory: Positive for cough and shortness of breath. Negative for hemoptysis, sputum production, wheezing and stridor.   Cardiovascular: Negative for chest pain and leg swelling.  Gastrointestinal: Negative for abdominal pain, blood in stool, diarrhea, melena, nausea and vomiting.  Genitourinary: Negative for dysuria and hematuria.  Musculoskeletal: Negative for joint pain.  Skin: Negative for itching and rash.  Neurological: Negative for dizziness, focal weakness, loss of consciousness and weakness.  Psychiatric/Behavioral: Negative for depression. The patient is not nervous/anxious.     DRUG ALLERGIES:   Allergies  Allergen Reactions  . Codeine     Other reaction(s): Unknown  . Sulfa Antibiotics Other (See Comments)    Deathly sick   . Tape Itching    Other reaction(s): Itching of Skin   VITALS:  Blood pressure 108/75, pulse (!) 107, temperature 98.2 F (36.8 C), temperature source Oral, resp. rate 16, height 5\' 9"  (1.753 m), weight 210 lb 15.7 oz (95.7 kg), SpO2 92 %. PHYSICAL EXAMINATION:  Physical Exam  Constitutional: She is oriented to person, place, and time and well-developed, well-nourished, and in no distress.  HENT:  Mouth/Throat: Oropharynx is clear and moist.  Eyes: Conjunctivae and EOM are normal.  Neck: Normal range of motion. Neck supple. No JVD present. No tracheal deviation present.  Cardiovascular: Normal rate, regular rhythm and normal heart sounds.  Exam reveals no gallop.   No murmur  heard. Pulmonary/Chest: Effort normal. No respiratory distress. She has no wheezes. She has rales.  Abdominal: Soft. Bowel sounds are normal. She exhibits no distension. There is no tenderness.  Musculoskeletal: Normal range of motion. She exhibits no edema or tenderness.  Neurological: She is alert and oriented to person, place, and time. No cranial nerve deficit.  Skin: No rash noted. No erythema.  Psychiatric: Affect and judgment normal.   LABORATORY PANEL:   CBC  Recent Labs Lab 09/01/16 0807  WBC 8.2  HGB 12.2  HCT 37.2  PLT 172   ------------------------------------------------------------------------------------------------------------------ Chemistries   Recent Labs Lab 08/31/16 1811 09/01/16 0807  NA 138 138  K 3.6 3.4*  CL 104 104  CO2 25 25  GLUCOSE 95 100*  BUN 15 15  CREATININE 0.88 0.86  CALCIUM 8.9 8.9  MG 2.1  --    RADIOLOGY:  Dg Chest Portable 1 View  Result Date: 08/31/2016 CLINICAL DATA:  Shortness of breath and weakness. EXAM: PORTABLE CHEST 1 VIEW COMPARISON:  10/11/2014 FINDINGS: Moderate cardiac enlargement. Aortic atherosclerosis noted. Pleural effusions identified right greater in left. There is mild diffuse interstitial edema. IMPRESSION: 1. Mild congestive heart failure. Electronically Signed   By: Kerby Moors M.D.   On: 08/31/2016 17:39   ASSESSMENT AND PLAN:   This is a 81 y.o. female with a history of Afib RVR on Xarelto, CVA, HTN, HLD now being admitted with:  1. Afib RVR- likely multifactorial due to poor diet over the past week, recent infection, antibiotic use.   - Refractory to Cardizem, Digoxin, calcium gluconate, Lopressor, IVF  Continue Cardizem Drip and  Digoxin. Continue xarelto.  F/u echo and Consult Cardiology  2.Mild CHF, new onset, likely related to #1.  - Moderate cardiac enlargement, bilateral pleural effusions and interstitial edema seen on CXR. start lasix iv bid.  3. HTN, controlled. - hold HCTZ and start  lasix.  4. Hyperlidipemia  - Continue medication Simvastatin   Hypokalemia. Given KCl. Mag is normal.  All the records are reviewed and case discussed with Care Management/Social Worker. Management plans discussed with the patient, family and they are in agreement.  CODE STATUS: full code.  TOTAL TIME TAKING CARE OF THIS PATIENT: 37 minutes.   More than 50% of the time was spent in counseling/coordination of care: YES  POSSIBLE D/C IN 2 DAYS, DEPENDING ON CLINICAL CONDITION.   Demetrios Loll M.D on 09/01/2016 at 11:18 AM  Between 7am to 6pm - Pager - [phone]  After 6pm go to [email or web address] - Proofreader  [facility]  Office  [phone]  CC: Primary care physician; Dionisio David, MD  Note: This dictation was prepared with Dragon dictation along with smaller phrase technology. Any transcriptional errors that result from this process are unintentional.

## 2016-09-01 NOTE — ED Notes (Signed)
Pts HR continues to improve much. Inpatient order needing chaged to step down.  Admitting MD paged to get order and to increased O2 to kep sat above 92%.

## 2016-09-01 NOTE — Progress Notes (Signed)
*  PRELIMINARY RESULTS* Echocardiogram 2D Echocardiogram has been performed.  Sherrie Sport 09/01/2016, 9:20 AM

## 2016-09-01 NOTE — H&P (Signed)
[place] @ [facility] Admission History and Physical [employer], D.O.    Patient Name: Victoria Contreras MR#: [id number] Date of Birth: Oct 03, 1924 Date of Admission: 08/31/2016  Referring MD/NP/PA: Merlyn Lot, MD Primary Care Physician: Madelyn Brunner, MD Outpatient Specialists: Dr. Zella Richer  Patient coming from: [facility]  Chief Complaint: Short of breath   HPI: Victoria Contreras is a 82 y.o. female with a known history of Afib RVR on Xarelto, CVA, HTN presents to the emergency department for evaluation of shortness of breath.  Patient was in a usual state of health until 11 hours prior to arrival. She awoke today short of breath, she has not tried anything to relieve this because she thought it would resolve on its own. It was associated with intermittent palpitations. She states it is worse with walking or exertion. It worsened throughout the day. She felt unstable, weak and had to hold on to the wall when walking approximately 5 hours to arrival. Her daughter took her to [facility] and was found to be in Afib RVR and sent her the the ED. She denies fever, chills, nausea, vomiting, dizziness, altered mental status, recent falls, racing heart, chest pain or tightness.   She received the flu shot and is current on her pneumonia vaccines. She lives in an independent living facility.   Of note, according to her daughter her functional capacity has been limited recently, and she is having trouble with hoarding in her home and personal hygiene.  Social work is involved.     Patient recently was treated for a UTI with Cipro. She believes this is causing her to have diarrhea with meals. She experienced two episodes of diarrhea today immediately after eating lunch. She reports that this is not unusual for her.  Her diet has been limited to Saltine crackers due to nausea side effect from antibiotic. Patient has been taking medication as prescribed and there has been no other  recent change in medication or diet.  There has been no recent illness, travel or sick contacts.    ED Course: Given Metoprolol, Cardizem, Digoxin, calcium gluconate. Placed on 2L O2 via nasal cannula.   Review of Systems:  CONSTITUTIONAL: Positive for fatigue, weakness. No fever/chills, weight gain/loss, headache. EYES: No blurry or double vision. ENT: No tinnitus, postnasal drip, redness or soreness of the oropharynx. RESPIRATORY: Positive for increased work of breathing, dyspnea, wheeze. No cough, hemoptysis.  CARDIOVASCULAR: Positive for palpitations. No chest pain,  syncope, orthopnea,  GASTROINTESTINAL: Positive for diarrhea. No nausea, vomiting, abdominal pain, constipation.  No hematemesis, melena or hematochezia. GENITOURINARY: No dysuria, frequency, hematuria. ENDOCRINE: No polyuria or nocturia. No heat or cold intolerance. HEMATOLOGY: No anemia, bruising, bleeding. INTEGUMENTARY: No rashes, ulcers, lesions. MUSCULOSKELETAL: No arthritis, gout, dyspnea.  NEUROLOGIC: No numbness, tingling, ataxia, seizure-type activity, weakness. PSYCHIATRIC: No anxiety, depression, insomnia.   Past Medical History:  Diagnosis Date  . Atrial fibrillation ([place])   . Cataracts, both eyes   . CLL (chronic lymphocytic leukemia) ([place])   . CLL (chronic lymphoid leukemia) in relapse ([place]) 04/17/2015  . CVA (cerebral infarction)   . Frequent nosebleeds   . Gout   . Heart attack   . Heart burn   . Hemangiopericytoma   . HLD (hyperlipidemia)   . HTN (hypertension)   . UTI (lower urinary tract infection)     Past Surgical History:  Procedure Laterality Date  . APPENDECTOMY    . BREAST LUMPECTOMY    . CATARACT EXTRACTION BILATERAL  W/ ANTERIOR VITRECTOMY     right/left eye  . CHOLECYSTECTOMY    . DILATION AND CURETTAGE OF UTERUS    . hemangiopericytoma resection    . TONSILLECTOMY    . TOTAL KNEE ARTHROPLASTY     right knee     reports that she has quit smoking. Her smoking use included  Cigarettes. She has never used smokeless tobacco. She reports that she does not drink alcohol or use drugs.  Allergies  Allergen Reactions  . Codeine     Other reaction(s): Unknown  . Sulfa Antibiotics Other (See Comments)    Deathly sick   . Tape Itching    Other reaction(s): Itching of Skin    Family History  Problem Relation Age of Onset  . Other Father     "brain hemorrhage"  . Melanoma Daughter 11  . Alzheimer's disease Sister   . Alzheimer's disease Brother   . Alzheimer's disease Sister   . Kidney disease Neg Hx   . Bladder Cancer Neg Hx    Family history has been reviewed and confirmed with patient.   Prior to Admission medications   Medication Sig Start Date End Date Taking? Authorizing Provider  acetaminophen (TYLENOL) 500 MG tablet Take 500 mg by mouth every 4 (four) hours as needed for mild pain or fever.   Yes Historical Provider, MD  allopurinol (ZYLOPRIM) 300 MG tablet Take 300 mg by mouth daily.   Yes Historical Provider, MD  hydrochlorothiazide (HYDRODIURIL) 25 MG tablet Take 25 mg by mouth every morning.   Yes Historical Provider, MD  naproxen sodium (ANAPROX) 220 MG tablet Take 440 mg by mouth 2 (two) times daily with a meal. Reported on 11/05/2015   Yes Historical Provider, MD  phenazopyridine (PYRIDIUM) 100 MG tablet Take 100 mg by mouth 3 (three) times daily as needed. Reported on 02/05/2016 10/13/15  Yes Historical Provider, MD  rivaroxaban (XARELTO) 20 MG TABS tablet Take 20 mg by mouth daily with supper.   Yes Historical Provider, MD  simvastatin (ZOCOR) 20 MG tablet Take 20 mg by mouth daily.   Yes Historical Provider, MD  ESTRACE VAGINAL 0.1 MG/GM vaginal cream USE AS INSTRUCTED BY YOUR PRESCRIBER Patient not taking: Reported on 08/31/2016 08/09/16   Nori Riis, PA-C    Physical Exam: Vitals:   09/01/16 0045 09/01/16 0100 09/01/16 0105 09/01/16 0110  BP: 125/80 119/78    Pulse: (!) 103 (!) 109 (!) 116 100  Resp: (!) 23 (!) 22 (!) 22 20  Temp:       TempSrc:      SpO2: 90% 90% 90% 91%  Weight:      Height:        GENERAL: 82 y.o.-year-old caucasian patient, well-developed, well-nourished lying in the bed in no acute distress.  Pleasant and cooperative.   HEENT: Head atraumatic, normocephalic. Pupils equal, round, reactive to light and accommodation. No scleral icterus. Extraocular muscles intact. Nares are patent. Oropharynx is clear. Mucus membranes dry. NECK: Supple, full range of motion. No JVD, no bruit heard. No thyroid enlargement, no tenderness, no cervical lymphadenopathy. CHEST: Bibasilar crackles. No use of accessory muscles of respiration.  No reproducible chest wall tenderness.  CARDIOVASCULAR: S1, S2 normal. No murmurs, rubs, or gallops. Cap refill <2 seconds. Pulses intact distally.  ABDOMEN: Soft, nondistended, nontender. No rebound, guarding, rigidity. Normoactive bowel sounds present in all four quadrants. No organomegaly or mass. EXTREMITIES: Positive for LLE to distal shin. No cyanosis, or clubbing. NEUROLOGIC: Cranial nerves II through XII  are grossly intact with no focal sensorimotor deficit. Muscle strength 5/5 in all extremities. Sensation intact. Gait not checked. PSYCHIATRIC: The patient is alert and oriented x 3. Normal affect, mood, thought content. SKIN: Warm, dry, and intact without obvious rash, lesion, or ulcer.   Labs on Admission: I have personally reviewed following labs and imaging studies  CBC:  Recent Labs Lab 08/31/16 1811  WBC 9.3  HGB 12.6  HCT 38.3  MCV 84.3  PLT 0000000   Basic Metabolic Panel:  Recent Labs Lab 08/31/16 1811  NA 138  K 3.6  CL 104  CO2 25  GLUCOSE 95  BUN 15  CREATININE 0.88  CALCIUM 8.9  MG 2.1   GFR: Estimated Creatinine Clearance: 53.2 mL/min (by C-G formula based on SCr of 0.88 mg/dL). Liver Function Tests: No results for input(s): AST, ALT, ALKPHOS, BILITOT, PROT, ALBUMIN in the last 168 hours. No results for input(s): LIPASE, AMYLASE in the last 168  hours. No results for input(s): AMMONIA in the last 168 hours. Coagulation Profile: No results for input(s): INR, PROTIME in the last 168 hours. Cardiac Enzymes:  Recent Labs Lab 08/31/16 1811  TROPONINI <0.03   BNP (last 3 results) No results for input(s): PROBNP in the last 8760 hours. HbA1C: No results for input(s): HGBA1C in the last 72 hours. CBG: No results for input(s): GLUCAP in the last 168 hours. Lipid Profile: No results for input(s): CHOL, HDL, LDLCALC, TRIG, CHOLHDL, LDLDIRECT in the last 72 hours. Thyroid Function Tests: No results for input(s): TSH, T4TOTAL, FREET4, T3FREE, THYROIDAB in the last 72 hours. Anemia Panel: No results for input(s): VITAMINB12, FOLATE, FERRITIN, TIBC, IRON, RETICCTPCT in the last 72 hours. Urine analysis:    Component Value Date/Time   COLORURINE YELLOW (A) 08/31/2016 1953   APPEARANCEUR HAZY (A) 08/31/2016 1953   APPEARANCEUR Clear 12/27/2013 2255   LABSPEC 1.019 08/31/2016 1953   LABSPEC 1.006 12/27/2013 2255   PHURINE 5.0 08/31/2016 1953   GLUCOSEU NEGATIVE 08/31/2016 1953   GLUCOSEU Negative 12/27/2013 2255   HGBUR NEGATIVE 08/31/2016 1953   BILIRUBINUR NEGATIVE 08/31/2016 1953   BILIRUBINUR Negative 12/27/2013 2255   [place] 08/31/2016 1953   PROTEINUR 30 (A) 08/31/2016 1953   NITRITE NEGATIVE 08/31/2016 1953   LEUKOCYTESUR LARGE (A) 08/31/2016 1953   LEUKOCYTESUR Trace 12/27/2013 2255   Sepsis Labs: @LABRCNTIP (procalcitonin:4,lacticidven:4) )No results found for this or any previous visit (from the past 240 hour(s)).   Radiological Exams on Admission: Dg Chest Portable 1 View  Result Date: 08/31/2016 CLINICAL DATA:  Shortness of breath and weakness. EXAM: PORTABLE CHEST 1 VIEW COMPARISON:  10/11/2014 FINDINGS: Moderate cardiac enlargement. Aortic atherosclerosis noted. Pleural effusions identified right greater in left. There is mild diffuse interstitial edema. IMPRESSION: 1. Mild congestive heart failure.  Electronically Signed   By: Kerby Moors M.D.   On: 08/31/2016 17:39    EKG: Afib with RVR at 140 bpm with normal axis and nonspecific ST-T wave changes.   Assessment/Plan Active Problems:   Atrial fibrillation with rapid ventricular response [facility])    This is a 82 y.o. female with a history of Afib RVR on Xarelto, CVA, HTN, HLD now being admitted with:  1. Afib RVR- likely multifactorial due to poor diet over the past week, recent infection, antibiotic use.   - Refractory to Cardizem, Digoxin, calcium gluconate, Lopressor, IVF - Admit to Step Down - Cardizem Drip, Digoxin.  consider Amiodarone - Continue Cardizem, Digoxin, calcium gluconate - Elevated lactate, monitor lactic acid levels - Trend  BNP, troponins - Continuous cardiac monitoring and pulse oximetry - Consult Cardiology - Check echo as she has not had one.   2.Mild CHF, new onset, likely related to #1.  - Moderate cardiac enlargement, bilateral pleural effusions and interstitial edema seen on CXR.  3. H/O HTN - Continue medication HCTZ  4. Hyperlidipemia - Continue medication Simvastatin    Admission status: Step Down IV Fluids: none Diet/Nutrition: Heart Healthy Consults called: Cardiology  DVT Px: Lovenox, SCDs and early ambulation Code Status: Full Code  Disposition Plan: TBD   All the records are reviewed and case discussed with ED provider. Management plans discussed with the patient and/or family who express understanding and agree with plan of care.  Vondra Aldredge D.O. on 09/01/2016 at 2:05 AM Between 7am to 6pm - Pager - [phone] After 6pm go to [email or web address] - Proofreader [facility] [place] [facility] Office [phone] CC: Primary care physician; Madelyn Brunner, MD  09/01/2016, 2:05 AM

## 2016-09-02 ENCOUNTER — Inpatient Hospital Stay: Payer: Medicare Other

## 2016-09-02 LAB — BASIC METABOLIC PANEL
ANION GAP: 7 (ref 5–15)
BUN: 17 mg/dL (ref 6–20)
CALCIUM: 8.6 mg/dL — AB (ref 8.9–10.3)
CO2: 23 mmol/L (ref 22–32)
CREATININE: 0.76 mg/dL (ref 0.44–1.00)
Chloride: 107 mmol/L (ref 101–111)
GFR calc Af Amer: >60 mL/min (ref >60)
GLUCOSE: 100 mg/dL — AB (ref 65–99)
Potassium: 3.8 mmol/L (ref 3.5–5.1)
Sodium: 137 mmol/L (ref 135–145)

## 2016-09-02 LAB — MRSA PCR SCREENING: MRSA by PCR: NEGATIVE

## 2016-09-02 MED ORDER — DILTIAZEM HCL ER COATED BEADS 300 MG PO CP24
300.0000 mg | ORAL_CAPSULE | Freq: Every day | ORAL | Status: DC
  Administered 2016-09-02 – 2016-09-07 (×6): 300 mg via ORAL
  Filled 2016-09-02 (×6): qty 1

## 2016-09-02 MED ORDER — AMIODARONE LOAD VIA INFUSION
150.0000 mg | Freq: Once | INTRAVENOUS | Status: AC
  Administered 2016-09-02: 150 mg via INTRAVENOUS
  Filled 2016-09-02: qty 83.34

## 2016-09-02 MED ORDER — IOPAMIDOL (ISOVUE-370) INJECTION 76%
75.0000 mL | Freq: Once | INTRAVENOUS | Status: AC | PRN
  Administered 2016-09-02: 75 mL via INTRAVENOUS

## 2016-09-02 MED ORDER — AMIODARONE HCL IN DEXTROSE 360-4.14 MG/200ML-% IV SOLN
60.0000 mg/h | INTRAVENOUS | Status: DC
  Filled 2016-09-02 (×2): qty 200

## 2016-09-02 MED ORDER — AMIODARONE HCL IN DEXTROSE 360-4.14 MG/200ML-% IV SOLN
30.0000 mg/h | INTRAVENOUS | Status: DC
  Filled 2016-09-02: qty 200

## 2016-09-02 NOTE — Progress Notes (Signed)
[facility] Cardiology [facility] Encounter Note  Patient: Victoria Contreras / Admit Date: 08/31/2016 / Date of Encounter: 09/02/2016, 8:09 AM   Subjective: Patient feels much better at this time with no evidence of congestive heart failure or chest discomfort. Heart rate still fairly rapid at this time  Review of Systems: Positive for: Shortness of breath Negative for: Vision change, hearing change, syncope, dizziness, nausea, vomiting,diarrhea, bloody stool, stomach pain, cough, congestion, diaphoresis, urinary frequency, urinary pain,skin lesions, skin rashes Others previously listed  Objective: Telemetry: Atrial fibrillation with rapid ventricular rate Physical Exam: Blood pressure 101/66, pulse 97, temperature 98.2 F (36.8 C), temperature source Oral, resp. rate 16, height 5\' 9"  (1.753 m), weight 95.7 kg (210 lb 15.7 oz), SpO2 92 %. Body mass index is 31.16 kg/m. General: Well developed, well nourished, in no acute distress. Head: Normocephalic, atraumatic, sclera non-icteric, no xanthomas, nares are without discharge. Neck: No apparent masses Lungs: Normal respirations with some wheezes, no rhonchi, no rales , few crackles   Heart: Irregular rate and rhythm, normal S1 S2, no murmur, no rub, no gallop, PMI is normal size and placement, carotid upstroke normal without bruit, jugular venous pressure normal Abdomen: Soft, non-tender, non-distended with normoactive bowel sounds. No hepatosplenomegaly. Abdominal aorta is normal size without bruit Extremities: Trace edema, no clubbing, no cyanosis, no ulcers,  Peripheral: 2+ radial, 2+ femoral, 2+ dorsal pedal pulses Neuro: Alert and oriented. Moves all extremities spontaneously. Psych:  Responds to questions appropriately with a normal affect.   Intake/Output Summary (Last 24 hours) at 09/02/16 0809 Last data filed at 09/02/16 0700  Gross per 24 hour  Intake           541.67 ml  Output             1525 ml  Net          -983.33 ml     Inpatient Medications:  . allopurinol  300 mg Oral Daily  . diltiazem  300 mg Oral Daily  . furosemide  20 mg Intravenous Q12H  . mouth rinse  15 mL Mouth Rinse BID  . rivaroxaban  20 mg Oral Q supper  . simvastatin  20 mg Oral Daily   Infusions:  . diltiazem (CARDIZEM) infusion 2.5 mg/hr (09/02/16 0700)    Labs:  Recent Labs  08/31/16 1811 09/01/16 0807 09/02/16 0606  NA 138 138 137  K 3.6 3.4* 3.8  CL 104 104 107  CO2 25 25 23   GLUCOSE 95 100* 100*  BUN 15 15 17   CREATININE 0.88 0.86 0.76  CALCIUM 8.9 8.9 8.6*  MG 2.1  --   --    No results for input(s): AST, ALT, ALKPHOS, BILITOT, PROT, ALBUMIN in the last 72 hours.  Recent Labs  08/31/16 1811 09/01/16 0807  WBC 9.3 8.2  HGB 12.6 12.2  HCT 38.3 37.2  MCV 84.3 83.8  PLT 178 172    Recent Labs  08/31/16 1811  TROPONINI <0.03   Invalid input(s): POCBNP No results for input(s): HGBA1C in the last 72 hours.   Weights: Filed Weights   08/31/16 1652 09/01/16 0335  Weight: 95.3 kg (210 lb) 95.7 kg (210 lb 15.7 oz)     Radiology/Studies:  Dg Chest Portable 1 View  Result Date: 08/31/2016 CLINICAL DATA:  Shortness of breath and weakness. EXAM: PORTABLE CHEST 1 VIEW COMPARISON:  10/11/2014 FINDINGS: Moderate cardiac enlargement. Aortic atherosclerosis noted. Pleural effusions identified right greater in left. There is mild diffuse interstitial edema. IMPRESSION: 1. Mild  congestive heart failure. Electronically Signed   By: Kerby Moors M.D.   On: 08/31/2016 17:39     Assessment and Recommendation  82 y.o. female with paroxysmal nonvalvular atrial fibrillation essential hypertension makes hyperlipidemia having significant atrial fibrillation with rapid ventricular rate. Patient has responded relatively well to metoprolol although the patient has suggested that she has had significant side effects of this medication including nausea. Therefore we'll change to diltiazem X 1. Discontinuation of metoprolol  due to previous history of side effects 2. Diltiazem 300 mg each day for better heart rate control of atrial fibrillation and adjust dosage with ambulation today 3. Continue Xarelto for further risk reduction in stroke with atrial fibrillation 4. High intensity cholesterol therapy with simvastatin 5. If ambulating well with good heart rate control okay for discharge to home from cardiac standpoint with follow-up next week for further adjustments  Signed, Serafina Royals M.D. FACC

## 2016-09-02 NOTE — Plan of Care (Signed)
Problem: Safety: Goal: Ability to remain free from injury will improve Outcome: Progressing BP 121/74   Pulse 82   Temp 97.6 F (36.4 C) (Axillary)   Resp 19   Ht 5\' 9"  (1.753 m)   Wt 95.7 kg (210 lb 15.7 oz)   SpO2 94%   BMI 31.16 kg/m   POC reviewed with patient, cont on q2 turns, vital signs closely monitored and any concerns addressed at this time. Will continue to monitor.  Cont on diuresing with lasix, started on amiodorone and cont on cardizem drip, rate not controlled  Pt is on xarelto PT consult

## 2016-09-02 NOTE — Progress Notes (Addendum)
[place] [facility] [place] NAME: Victoria Contreras    MR#:  [id number]  DATE OF BIRTH:  Jan 13, 1925  SUBJECTIVE:  CHIEF COMPLAINT:   Chief Complaint  Patient presents with  . Weakness   Better SOB and cough. On O2 [place] 3 L. HR is at 120-130s with cardizem drip. REVIEW OF SYSTEMS:  Review of Systems  Constitutional: Positive for malaise/fatigue. Negative for chills and fever.  HENT: Negative for congestion.   Respiratory: Positive for cough and shortness of breath. Negative for hemoptysis, sputum production, wheezing and stridor.   Cardiovascular: Negative for chest pain and leg swelling.  Gastrointestinal: Negative for abdominal pain, blood in stool, diarrhea, melena, nausea and vomiting.  Genitourinary: Negative for dysuria and hematuria.  Musculoskeletal: Negative for joint pain.  Skin: Negative for itching and rash.  Neurological: Negative for dizziness, focal weakness, loss of consciousness and weakness.  Psychiatric/Behavioral: Negative for depression. The patient is not nervous/anxious.     DRUG ALLERGIES:   Allergies  Allergen Reactions  . Codeine     Other reaction(s): Unknown  . Sulfa Antibiotics Other (See Comments)    Deathly sick   . Tape Itching    Other reaction(s): Itching of Skin   VITALS:  Blood pressure 120/77, pulse (!) 110, temperature 97.6 F (36.4 C), temperature source Axillary, resp. rate 19, height 5\' 9"  (1.753 m), weight 95.7 kg (210 lb 15.7 oz), SpO2 93 %. PHYSICAL EXAMINATION:  Physical Exam  Constitutional: She is oriented to person, place, and time and well-developed, well-nourished, and in no distress.  HENT:  Mouth/Throat: Oropharynx is clear and moist.  Eyes: Conjunctivae and EOM are normal.  Neck: Normal range of motion. Neck supple. No JVD present. No tracheal deviation present.  Cardiovascular: Normal heart sounds.  An irregularly irregular rhythm present. Tachycardia present.  Exam reveals no gallop.    No murmur heard. Pulmonary/Chest: Effort normal. No respiratory distress. She has no wheezes. She has rales in the right lower field and the left lower field.  Abdominal: Soft. Bowel sounds are normal. She exhibits no distension. There is no tenderness.  Musculoskeletal: Normal range of motion. She exhibits no edema or tenderness.  Neurological: She is alert and oriented to person, place, and time. No cranial nerve deficit.  Skin: No rash noted. No erythema.  Psychiatric: Affect and judgment normal.   LABORATORY PANEL:   CBC  Recent Labs Lab 09/01/16 0807  WBC 8.2  HGB 12.2  HCT 37.2  PLT 172   ------------------------------------------------------------------------------------------------------------------ Chemistries   Recent Labs Lab 08/31/16 1811  09/02/16 0606  NA 138  < > 137  K 3.6  < > 3.8  CL 104  < > 107  CO2 25  < > 23  GLUCOSE 95  < > 100*  BUN 15  < > 17  CREATININE 0.88  < > 0.76  CALCIUM 8.9  < > 8.6*  MG 2.1  --   --   < > = values in this interval not displayed. RADIOLOGY:  No results found. ASSESSMENT AND PLAN:   This is a 82 y.o. female with a history of Afib RVR on Xarelto, CVA, HTN, HLD now being admitted with:  1. Afib RVR- likely multifactorial due to poor diet over the past week, recent infection, antibiotic use.   - Refractory to Cardizem, Digoxin, calcium gluconate, Lopressor, IVF. Discussed with cardiologist, initiate patient on amiodarone drip, follow closely.  Continue xarelto.  Echo revealed normal ejection fraction, mitral  regurgitation, trivial aortic regurgitation, appreciate  Cardiology input  2.Mild acute diastolic CHF, new onset, likely related to A. fib, RVR  - Moderate cardiac enlargement, bilateral pleural effusions and interstitial edema seen on CXR. Continue lasix iv bid, diuresed about 900 cc over the past 24 hours, now on 3 L of oxygen through nasal cannula.  3. HTN, controlled. - hold HCTZ and continue Cardizem, lasix  as long as blood pressure tolerates.  4. Hyperlidipemia  - Continue medication Simvastatin   5 Hypokalemia. Given KCl. Mag is normal. Potassium level is normal today  6. Acute respiratory failure with hypoxia, now on 3 L of oxygen through nasal cannula, could be related to diastolic CHF, cannot rule out pulmonary embolism, get CT angiogram of the chest despite Xarelto use  7. Pyuria, rule out urinary tract infection, get urine culture, initiate antibiotic therapy if needed   All the records are reviewed and case discussed with Care Management/Social Worker. Management plans discussed with the patient, family and they are in agreement.  CODE STATUS: full code.  TOTAL TIME TAKING CARE OF THIS PATIENT: 40 minutes.     POSSIBLE D/C IN 2 DAYS, DEPENDING ON CLINICAL CONDITION.   Theodoro Grist M.D on 09/02/2016 at 3:23 PM  Between 7am to 6pm - Pager - [phone]  After 6pm go to [email or web address] - Proofreader  [facility] [place] [facility]  Office  [phone]  CC: Primary care physician; Dionisio David, MD  Note: This dictation was prepared with Dragon dictation along with smaller phrase technology. Any transcriptional errors that result from this process are unintentional.

## 2016-09-03 MED ORDER — AMIODARONE HCL 200 MG PO TABS
200.0000 mg | ORAL_TABLET | Freq: Two times a day (BID) | ORAL | Status: DC
  Administered 2016-09-03 – 2016-09-05 (×5): 200 mg via ORAL
  Filled 2016-09-03 (×5): qty 1

## 2016-09-03 MED ORDER — CEFTRIAXONE SODIUM 1 G IJ SOLR: 1.0000 g | INTRAMUSCULAR | Status: DC

## 2016-09-03 MED ORDER — DEXTROSE 5 % IV SOLN: 1.0000 g | INTRAVENOUS | Status: DC

## 2016-09-03 MED ORDER — CEFTRIAXONE SODIUM-DEXTROSE 1-3.74 GM-% IV SOLR
1.0000 g | INTRAVENOUS | Status: DC
  Administered 2016-09-03 – 2016-09-07 (×5): 1 g via INTRAVENOUS
  Filled 2016-09-03 (×5): qty 50

## 2016-09-03 NOTE — Progress Notes (Signed)
[place] [facility]  SUBJECTIVE: feels better. Review of heart rate shows range of afib from 88-122. Increased rates with activity.    Vitals:   09/03/16 0300 09/03/16 0400 09/03/16 0500 09/03/16 0600  BP: (!) 114/93 116/79 122/71 109/75  Pulse: (!) 102 (!) 101 (!) 105 (!) 102  Resp: (!) 24 14 12 15   Temp:  98.4 F (36.9 C)    TempSrc:  Oral    SpO2: 92% 91% 93% 91%  Weight:      Height:        Intake/Output Summary (Last 24 hours) at 09/03/16 0854 Last data filed at 09/03/16 0600  Gross per 24 hour  Intake          1117.84 ml  Output             2100 ml  Net          -982.16 ml    LABS: Basic Metabolic Panel:  Recent Labs  08/31/16 1811 09/01/16 0807 09/02/16 0606  NA 138 138 137  K 3.6 3.4* 3.8  CL 104 104 107  CO2 25 25 23   GLUCOSE 95 100* 100*  BUN 15 15 17   CREATININE 0.88 0.86 0.76  CALCIUM 8.9 8.9 8.6*  MG 2.1  --   --    Liver Function Tests: No results for input(s): AST, ALT, ALKPHOS, BILITOT, PROT, ALBUMIN in the last 72 hours. No results for input(s): LIPASE, AMYLASE in the last 72 hours. CBC:  Recent Labs  08/31/16 1811 09/01/16 0807  WBC 9.3 8.2  HGB 12.6 12.2  HCT 38.3 37.2  MCV 84.3 83.8  PLT 178 172   Cardiac Enzymes:  Recent Labs  08/31/16 1811  TROPONINI <0.03   BNP: Invalid input(s): POCBNP D-Dimer: No results for input(s): DDIMER in the last 72 hours. Hemoglobin A1C: No results for input(s): HGBA1C in the last 72 hours. Fasting Lipid Panel:  Recent Labs  09/01/16 0807  CHOL 90  HDL 30*  LDLCALC 42  TRIG 89  CHOLHDL 3.0   Thyroid Function Tests:  Recent Labs  09/01/16 0807  TSH 2.108   Anemia Panel: No results for input(s): VITAMINB12, FOLATE, FERRITIN, TIBC, IRON, RETICCTPCT in the last 72 hours.   Physical Exam: Blood pressure 109/75, pulse (!) 102, temperature 98.4 F (36.9 C), temperature source Oral, resp. rate 15, height 5\' 9"  (1.753 m), weight 210 lb 15.7 oz (95.7  kg), SpO2 91 %.   Wt Readings from Last 1 Encounters:  09/01/16 210 lb 15.7 oz (95.7 kg)     General appearance: alert and cooperative Resp: rhonchi bilaterally Cardio: irregularly irregular rhythm Extremities: extremities normal, atraumatic, no cyanosis or edema Pulses: 2+ and symmetric Neurologic: Grossly normal  TELEMETRY: Reviewed telemetry pt in afib with variable vr.   ASSESSMENT AND PLAN:  Active Problems:   Atrial fibrillation with rapid ventricular response (HCC)-afib rate is improved. Rates increase to 100-120 with activity but at reset are in the high 90-100 range. Will discontinue iv amiodarone and change to po amiodarone at 200 bid. Continue xarelto and diltiazem. Transfer to telemetry and ambulate and follow rate.    Atrial fibrillation with RVR (HCC)    Teodoro Spray, MD, [facility] 09/03/2016 8:54 AM

## 2016-09-03 NOTE — Progress Notes (Addendum)
Good Day. Remains alert and oriented all day. Walked laps x2 around unit with PT using walker. Quickly weaned off oxygen to room air. Moved to 2A Report call ed Leonette Monarch RN

## 2016-09-03 NOTE — Progress Notes (Signed)
Patient arrived to 2A Room 239. Tele-box #[id number] verified with Estill Bamberg NT. Skin assessed with Yasmin RN and ecchymosis noted to bilateral arms. A&Ox4, VSS, and 1 assist to the bathroom. Nursing staff will continue to monitor for any changes in patient status. Earleen Reaper, RN

## 2016-09-03 NOTE — Evaluation (Signed)
Physical Therapy Evaluation Patient Details Name: Victoria Contreras MRN: [id number] DOB: 11-27-1924 Today's Date: 09/03/2016   History of Present Illness  Victoria Contreras is a 82 y.o. female with a known history of Afib RVR on Xarelto, CVA, HTN presents to the emergency department for evaluation of shortness of breath.  Patient was in a usual state of health until 11 hours prior to arrival. She awoke today short of breath, she has not tried anything to relieve this because she thought it would resolve on its own. It was associated with intermittent palpitations. She states it is worse with walking or exertion. It worsened throughout the day. She felt unstable, weak and had to hold on to the wall when walking approximately 5 hours to arrival. Her daughter took her to [facility] and was found to be in Afib RVR and sent her the the ED. She denies fever, chills, nausea, vomiting, dizziness, altered mental status, recent falls, racing heart, chest pain or tightness. She received the flu shot and is current on her pneumonia vaccines. She lives in an independent living facility.   Of note, according to her daughter her functional capacity has been limited recently, and she is having trouble with hoarding in her home and personal hygiene.  Social work is involved. Patient recently was treated for a UTI with Cipro. She believes this is causing her to have diarrhea with meals. She experienced two episodes of diarrhea today immediately after eating lunch. She reports that this is not unusual for her.  Her diet has been limited to Saltine crackers due to nausea side effect from antibiotic. Patient has been taking medication as prescribed and there has been no other recent change in medication or diet.  There has been no recent illness, travel or sick contacts.    Clinical Impression  Pt admitted with above diagnosis. Pt currently with functional limitations due to the deficits listed below (see PT Problem List).  Pt  reports she is close to her baseline mobility. She demonstrates good strength with bed mobility, transfers, and ambulation. Pt is steady in standing with transfers without an assistive device. Pt is able to safely ambulate in the ICU and SaO2 remains at or above 94% on room air during ambulation. HR remains close to resting values which is currently 110's to 120's. Pt denies DOE, chest pain, or palpitations with ambulation and no signs of fatigue noted when walking. Pt would benefit from [facility] PT to work on balance and safety after returning home. No DME needed as pt has all equipment needed. Pt will benefit from skilled PT services to address deficits in strength, balance, and mobility in order to return to full function at home.     Follow Up Recommendations Home health PT    Equipment Recommendations  None recommended by PT    Recommendations for Other Services       Precautions / Restrictions Precautions Precautions: None Restrictions Weight Bearing Restrictions: No      Mobility  Bed Mobility Overal bed mobility: Modified Independent             General bed mobility comments: Use of bed rails and HOB mildly elevated. Pt requires increased time but able to come upright without assistance  Transfers Overall transfer level: Needs assistance Equipment used: None Transfers: Sit to/from Stand Sit to Stand: Min guard         General transfer comment: Pt demonstrates good speed/sequencing with safe hand placement. Good stability once upright in standing. Once  standing pt able to lower undergarment to get onto commode without instability and no external assist to stabilize  Ambulation/Gait Ambulation/Gait assistance: Min guard Ambulation Distance (Feet): 100 Feet Assistive device: Rolling walker (2 wheeled) Gait Pattern/deviations: Decreased step length - right;Decreased step length - left Gait velocity: WFL for facility ambulation Gait velocity interpretation: <1.8 ft/sec,  indicative of risk for recurrent falls General Gait Details: Pt ambulates in ICU with decreased gait speed and step length. Vital continously monitored and SaO2 remain at or above 94% on room air. Pt denies DOE with ambulation. HR fluctuates between 110's to 120's during ambulation. Pt denies palpitations or chest pain and no notable fatigue evident. Pt is safe and stable with use of rolling for walker for ambulation and turns.   Stairs            Wheelchair Mobility    Modified Rankin (Stroke Patients Only)       Balance Overall balance assessment: Needs assistance Sitting-balance support: No upper extremity supported Sitting balance-Leahy Scale: Good     Standing balance support: No upper extremity supported Standing balance-Leahy Scale: Fair Standing balance comment: Pt demonstrates higher level balance deficits with increased lateral sway with eyes closed. Single leg balance approximately 1 second on each LE                             Pertinent Vitals/Pain Pain Assessment: No/denies pain    Home Living Family/patient expects to be discharged to:: Private residence (Independent living at [facility]) Living Arrangements: Alone Available Help at Discharge: Family Type of Home: Independent living facility Home Access: Outlook: One level Home Equipment: Environmental consultant - 4 wheels;Cane - single point;Shower seat;Grab bars - toilet;Grab bars - tub/shower (no wheelchair, no hospital bed)      Prior Function Level of Independence: Independent with assistive device(s)         Comments: Independent facility ambulator with rollator. Eats meals at facility. Independent with ADLs     Hand Dominance   Dominant Hand: Right    Extremity/Trunk Assessment   Upper Extremity Assessment Upper Extremity Assessment: Overall WFL for tasks assessed    Lower Extremity Assessment Lower Extremity Assessment: Overall WFL for tasks assessed        Communication   Communication: No difficulties  Cognition Arousal/Alertness: Awake/alert Behavior During Therapy: WFL for tasks assessed/performed Overall Cognitive Status: Within Functional Limits for tasks assessed                      General Comments      Exercises     Assessment/Plan    PT Assessment Patient needs continued PT services  PT Problem List Decreased activity tolerance;Decreased balance;Cardiopulmonary status limiting activity          PT Treatment Interventions Gait training;Therapeutic activities;Therapeutic exercise;Balance training;Neuromuscular re-education;Patient/family education    PT Goals (Current goals can be found in the Care Plan section)  Acute Rehab PT Goals Patient Stated Goal: Return to prior level of function at home PT Goal Formulation: With patient Time For Goal Achievement: 09/17/16 Potential to Achieve Goals: Good    Frequency Min 2X/week   Barriers to discharge Decreased caregiver support Lives alone in independent living apartment at [place] During Treatment: Gait belt Activity Tolerance: Patient tolerated treatment  well Patient left: in bed;with call bell/phone within reach;with bed alarm set Nurse Communication: Mobility status;Other (comment) (Needs remote control, vitals with activity)         Time: [id number] PT Time Calculation (min) (ACUTE ONLY): 39 min   Charges:   PT Evaluation $PT Eval Moderate Complexity: 1 Procedure PT Treatments $Gait Training: 8-22 mins   PT G Codes:       Lyndel Safe Victoria Contreras PT, DPT   Victoria Contreras 09/03/2016, 10:45 AM

## 2016-09-03 NOTE — Clinical Social Work Note (Signed)
CSW received call from Seth Bake at [facility], giving CSW a heads up that patient is an independent campus resident and may need healthcare for STR at discharge. PT has worked with patient today and states she can return to her independent apartment at discharge with home health. CSW has updated Seth Bake at [facility]. Shela Leff MSW,LcSW [phone]

## 2016-09-03 NOTE — Progress Notes (Signed)
[place] at [place] NAME: Victoria Contreras    MR#:  [id number]  DATE OF BIRTH:  11/28/1924  SUBJECTIVE:  CHIEF COMPLAINT:   Chief Complaint  Patient presents with  . Weakness   Patient feels good today, off oxygen, heart rate remained stable from 90s to 120s, now on amiodarone orally as well as Cardizem orally. Heart rate increases with exertion, denies any chest pain. CT angiogram was negative for pulmonary embolism, but CHF REVIEW OF SYSTEMS:  Review of Systems  Constitutional: Negative for chills, fever and malaise/fatigue.  HENT: Negative for congestion.   Respiratory: Negative for cough, hemoptysis, sputum production, shortness of breath, wheezing and stridor.   Cardiovascular: Negative for chest pain and leg swelling.  Gastrointestinal: Negative for abdominal pain, blood in stool, diarrhea, melena, nausea and vomiting.  Genitourinary: Negative for dysuria and hematuria.  Musculoskeletal: Negative for joint pain.  Skin: Negative for itching and rash.  Neurological: Negative for dizziness, focal weakness, loss of consciousness and weakness.  Psychiatric/Behavioral: Negative for depression. The patient is not nervous/anxious.     DRUG ALLERGIES:   Allergies  Allergen Reactions  . Codeine     Other reaction(s): Unknown  . Sulfa Antibiotics Other (See Comments)    Deathly sick   . Tape Itching    Other reaction(s): Itching of Skin   VITALS:  Blood pressure 124/83, pulse (!) 104, temperature 98.4 F (36.9 C), temperature source Oral, resp. rate 16, height 5\' 9"  (1.753 m), weight 95.7 kg (210 lb 15.7 oz), SpO2 (!) 89 %. PHYSICAL EXAMINATION:  Physical Exam  Constitutional: She is oriented to person, place, and time and well-developed, well-nourished, and in no distress.  HENT:  Mouth/Throat: Oropharynx is clear and moist.  Eyes: Conjunctivae and EOM are normal.  Neck: Normal range of motion. Neck supple. No JVD present. No  tracheal deviation present.  Cardiovascular: Normal heart sounds.  An irregularly irregular rhythm present. Tachycardia present.  Exam reveals no gallop.   No murmur heard. Pulmonary/Chest: Effort normal. No respiratory distress. She has no wheezes. She has no rales.  Abdominal: Soft. Bowel sounds are normal. She exhibits no distension. There is no tenderness.  Musculoskeletal: Normal range of motion. She exhibits no edema or tenderness.  Neurological: She is alert and oriented to person, place, and time. No cranial nerve deficit.  Skin: No rash noted. No erythema.  Psychiatric: Affect and judgment normal.   LABORATORY PANEL:   CBC  Recent Labs Lab 09/01/16 0807  WBC 8.2  HGB 12.2  HCT 37.2  PLT 172   ------------------------------------------------------------------------------------------------------------------ Chemistries   Recent Labs Lab 08/31/16 1811  09/02/16 0606  NA 138  < > 137  K 3.6  < > 3.8  CL 104  < > 107  CO2 25  < > 23  GLUCOSE 95  < > 100*  BUN 15  < > 17  CREATININE 0.88  < > 0.76  CALCIUM 8.9  < > 8.6*  MG 2.1  --   --   < > = values in this interval not displayed. RADIOLOGY:  Ct Angio Chest Pe W Or Wo Contrast  Result Date: 09/02/2016 CLINICAL DATA:  Acute respiratory failure with hypoxia. EXAM: CT ANGIOGRAPHY CHEST WITH CONTRAST TECHNIQUE: Multidetector CT imaging of the chest was performed using the standard protocol during bolus administration of intravenous contrast. Multiplanar CT image reconstructions and MIPs were obtained to evaluate the vascular anatomy. CONTRAST:  75 mL of Isovue 370 intravenous  contrast COMPARISON:  Chest radiograph, 08/31/2016.  Chest CT, 10/11/2014 FINDINGS: Cardiovascular: Satisfactory opacification of the pulmonary arteries to the segmental level. No evidence of pulmonary embolism. Heart is mildly enlarged. There are moderate coronary artery calcifications. The great vessels normal in caliber. There are atherosclerotic  calcifications along the thoracic aorta and its branch vessels. No dissection. A small amount of pericardial fluid collects adjacent to the ascending aorta. Mediastinum/Nodes: Mild enlargement of the right thyroid lobe, stable from the prior CT. No neck base or axillary masses or adenopathy. There are scattered prominent mediastinal lymph nodes, the largest an 18 window node measuring 11 mm in short axis. No mediastinal or hilar masses. Trachea is widely patent. Lungs/Pleura: Small right and minimal left pleural effusions. There is bilateral interstitial thickening. There are small areas of ill-defined ground-glass like opacity in both lungs. There is dependent opacity in the right lower lobe adjacent to the pleural effusion which is likely atelectasis. Mild subsegmental atelectasis is noted in the dependent left lower lobe. There is mild stable paraseptal emphysema in the upper lobes. No pneumothorax. Upper Abdomen: No acute abnormality. Musculoskeletal: No chest wall abnormality. No acute or significant osseous findings. Review of the MIP images confirms the above findings. IMPRESSION: 1. No evidence of a pulmonary embolus. 2. Findings are consistent with congestive heart failure with cardiomegaly, pleural effusions, and interstitial edema with small areas of airspace edema. Electronically Signed   By: Lajean Manes M.D.   On: 09/02/2016 15:56   ASSESSMENT AND PLAN:   This is a 83 y.o. female with a history of Afib RVR on Xarelto, CVA, HTN, HLD now being admitted with:  1. Afib RVR- likely multifactorial due to poor diet over the past week, recent infection, antibiotic use.   - Some better on oral Cardizem,  amiodarone, appreciate cardiology's input.  Continue xarelto.  Echo revealed normal ejection fraction, mitral regurgitation, trivial aortic regurgitation. Cardiologist recommends ambulation. Patient was seen by physical therapist, home health services were recommended. Advanced medications as needed  for better heart rate control, depending on blood pressure readings  2.Mild acute diastolic CHF, new onset, likely related to A. fib, RVR  - Moderate cardiac enlargement, bilateral pleural effusions and interstitial edema seen on CXR. Continue lasix iv bid, diuresed about 1.9 liters over her stay in the hospital time, now off oxygen completely, however, O2 sats are marginal  3. HTN, controlled. - holding HCTZ and continue Cardizem, lasix as long as blood pressure tolerates.  4. Hyperlidipemia  - Continue medication Simvastatin   5 Hypokalemia. Given KCl. Mag is normal. Potassium level is normal today  6. Acute respiratory failure with hypoxia, weaned off oxygen to room air , no  pulmonary embolism on  CT angiogram , continue Xarelto  7. Pyuria, rule out urinary tract infection, get urine culture, initiate antibiotic therapy if needed   All the records are reviewed and case discussed with Care Management/Social Worker. Management plans discussed with the patient, family and they are in agreement.  CODE STATUS: full code.  TOTAL TIME TAKING CARE OF THIS PATIENT: 30 minutes.     POSSIBLE D/C IN 2 DAYS, DEPENDING ON CLINICAL CONDITION.   Theodoro Grist M.D on 09/03/2016 at 3:10 PM  Between 7am to 6pm - Pager - [phone]  After 6pm go to [email or web address] - Proofreader  [facility]  Office  [phone]  CC: Primary care physician; Dionisio David, MD  Note: This dictation was prepared with Dragon dictation along with smaller phrase technology. Any  transcriptional errors that result from this process are unintentional.

## 2016-09-04 LAB — URINE CULTURE

## 2016-09-04 MED ORDER — FUROSEMIDE 20 MG PO TABS
20.0000 mg | ORAL_TABLET | Freq: Two times a day (BID) | ORAL | Status: DC
  Administered 2016-09-05 – 2016-09-07 (×5): 20 mg via ORAL
  Filled 2016-09-04 (×7): qty 1

## 2016-09-04 NOTE — Progress Notes (Signed)
[facility]  SUBJECTIVE: Patient feels back to her baseline. She denies chest pain or shortness of breath.   Vitals:   09/03/16 2039 09/04/16 0400 09/04/16 0801 09/04/16 0935  BP: 103/81 98/66 120/72 112/62  Pulse: (!) 116 92    Resp: 18 16    Temp: 98.5 F (36.9 C) 98.2 F (36.8 C)    TempSrc: Oral Oral    SpO2: 91% 93%    Weight: 206 lb 12.8 oz (93.8 kg)     Height:        Intake/Output Summary (Last 24 hours) at 09/04/16 0944 Last data filed at 09/04/16 0855  Gross per 24 hour  Intake             83.4 ml  Output             2450 ml  Net          -2366.6 ml    LABS: Basic Metabolic Panel:  Recent Labs  09/02/16 0606  NA 137  K 3.8  CL 107  CO2 23  GLUCOSE 100*  BUN 17  CREATININE 0.76  CALCIUM 8.6*   Liver Function Tests: No results for input(s): AST, ALT, ALKPHOS, BILITOT, PROT, ALBUMIN in the last 72 hours. No results for input(s): LIPASE, AMYLASE in the last 72 hours. CBC: No results for input(s): WBC, NEUTROABS, HGB, HCT, MCV, PLT in the last 72 hours. Cardiac Enzymes: No results for input(s): CKTOTAL, CKMB, CKMBINDEX, TROPONINI in the last 72 hours. BNP: Invalid input(s): POCBNP D-Dimer: No results for input(s): DDIMER in the last 72 hours. Hemoglobin A1C: No results for input(s): HGBA1C in the last 72 hours. Fasting Lipid Panel: No results for input(s): CHOL, HDL, LDLCALC, TRIG, CHOLHDL, LDLDIRECT in the last 72 hours. Thyroid Function Tests: No results for input(s): TSH, T4TOTAL, T3FREE, THYROIDAB in the last 72 hours.  Invalid input(s): FREET3 Anemia Panel: No results for input(s): VITAMINB12, FOLATE, FERRITIN, TIBC, IRON, RETICCTPCT in the last 72 hours.   Physical Exam: Blood pressure 112/62, pulse 92, temperature 98.2 F (36.8 C), temperature source Oral, resp. rate 16, height 5\' 9"  (1.753 m), weight 206 lb 12.8 oz (93.8 kg), SpO2 93 %.   Wt Readings from Last 1 Encounters:  09/03/16 206 lb 12.8 oz  (93.8 kg)     General appearance: alert and cooperative Resp: clear to auscultation bilaterally Chest wall: no tenderness Cardio: irregularly irregular rhythm Extremities: extremities normal, atraumatic, no cyanosis or edema Neurologic: Grossly normal  TELEMETRY: Reviewed telemetry pt in atrial fibrillation with variable but controlled ventricular response. Patient did have a 5 beat run of wide complex tachycardia last p.m. This may be ventricular versus aberrantly SVT. Serum magnesium was 2.1. No further sustained arrhythmias.:  ASSESSMENT AND PLAN:  Active Problems:   Atrial fibrillation with rapid ventricular response (HCC)-rate appears well controlled on current regimen including amiodarone at 200 mg twice daily, diltiazem 300 mg daily, anticoagulation with rivaroxiban 20 mg daily.. Would ambulate on this regimen and consider discharge to home with outpatient follow-up in our office.   Atrial fibrillation with RVR (HCC)    Teodoro Spray, MD, [facility] 09/04/2016 9:44 AM

## 2016-09-04 NOTE — Progress Notes (Signed)
[place] at [place] NAME: Victoria Contreras    MR#:  [id number]  DATE OF BIRTH:  01/31/1925  SUBJECTIVE: Seen at bedside, no shortness of breath, no chest pain, heart rate, blood pressure better but patient's daughter is concerned about her living situation at rehab. Daughter found  expired food in the refrigerator, or tic course, patient not able to go to dining area , all he ate in the last few  Days  Were pretzels  and crackers which cause her CHF. And daughter is very concerned for the patient to go back to [facility] [place] [facility] area the thing patient is not able to care for herself.   CHIEF COMPLAINT:   Chief Complaint  Patient presents with  . Weakness   Admitted with  atrial fibrillation with RVR, CHF. REVIEW OF SYSTEMS:  Review of Systems  Constitutional: Negative for chills, fever and malaise/fatigue.  HENT: Negative for congestion.   Respiratory: Negative for cough, hemoptysis, sputum production, shortness of breath, wheezing and stridor.   Cardiovascular: Negative for chest pain and leg swelling.  Gastrointestinal: Negative for abdominal pain, blood in stool, diarrhea, melena, nausea and vomiting.  Genitourinary: Negative for dysuria and hematuria.  Musculoskeletal: Negative for joint pain.  Skin: Negative for itching and rash.  Neurological: Negative for dizziness, focal weakness, loss of consciousness and weakness.  Psychiatric/Behavioral: Negative for depression. The patient is not nervous/anxious.     DRUG ALLERGIES:   Allergies  Allergen Reactions  . Codeine     Other reaction(s): Unknown  . Sulfa Antibiotics Other (See Comments)    Deathly sick   . Tape Itching    Other reaction(s): Itching of Skin   VITALS:  Blood pressure 103/69, pulse 79, temperature 98.1 F (36.7 C), temperature source Oral, resp. rate 18, height 5\' 9"  (1.753 m), weight 93.8 kg (206 lb 12.8 oz), SpO2 95 %. PHYSICAL EXAMINATION:  Physical  Exam  Constitutional: She is oriented to person, place, and time and well-developed, well-nourished, and in no distress.  HENT:  Mouth/Throat: Oropharynx is clear and moist.  Eyes: Conjunctivae and EOM are normal.  Neck: Normal range of motion. Neck supple. No JVD present. No tracheal deviation present.  Cardiovascular: Normal heart sounds.  An irregularly irregular rhythm present. Tachycardia present.  Exam reveals no gallop.   No murmur heard. Pulmonary/Chest: Effort normal. No respiratory distress. She has no wheezes. She has no rales.  Abdominal: Soft. Bowel sounds are normal. She exhibits no distension. There is no tenderness.  Musculoskeletal: Normal range of motion. She exhibits no edema or tenderness.  Neurological: She is alert and oriented to person, place, and time. No cranial nerve deficit.  Skin: No rash noted. No erythema.  Psychiatric: Affect and judgment normal.   LABORATORY PANEL:   CBC  Recent Labs Lab 09/01/16 0807  WBC 8.2  HGB 12.2  HCT 37.2  PLT 172   ------------------------------------------------------------------------------------------------------------------ Chemistries   Recent Labs Lab 08/31/16 1811  09/02/16 0606  NA 138  < > 137  K 3.6  < > 3.8  CL 104  < > 107  CO2 25  < > 23  GLUCOSE 95  < > 100*  BUN 15  < > 17  CREATININE 0.88  < > 0.76  CALCIUM 8.9  < > 8.6*  MG 2.1  --   --   < > = values in this interval not displayed. RADIOLOGY:  No results found. ASSESSMENT AND PLAN:  This is a 82 y.o. female with a history of Afib RVR on Xarelto, CVA, HTN, HLD now being admitted with:  1. Afib RVR- likely multifactorial due to poor diet over the past week, recent infection, antibiotic use.   - on Cardizem, amiodarone.  Continue xarelto.  Echo revealed normal ejection fraction, mitral regurgitation, trivial aortic regurgitation.   2.Mild acute diastolic CHF, new onset, likely related to A. fib, RVR discontinue IV Lasix, start Lasix  pills.    - Moderate cardiac enlargement, bilateral pleural effusions and interstitial edema seen on CXR.  3. HTN, controlled. Improved hypertension . -  4. Hyperlidipemia  - Continue medication Simvastatin   5 Hypokalemia. Given KCl. Mag is normal. Po 6. Acute respiratory failure with hypoxia, due to CHF: Improved. CT chest negative for PE. 7. Pyuria,; All urine culture showed mixed species.   ent plans discussed with the patient, family and they are in agreement.  CODE STATUS: full code.  TOTAL TIME TAKING CARE OF THIS PATIENT: 30 minutes.     POSSIBLE D/C IN 2 DAYS, DEPENDING ON CLINICAL CONDITION.   [name] M.D on 09/04/2016 at 12:36 PM  Between 7am to 6pm - Pager - [phone]  After 6pm go to [email or web address] - Proofreader  [facility]  Office  [phone]  CC: Primary care physician; Dionisio David, MD  Note: This dictation was prepared with Dragon dictation along with smaller phrase technology. Any transcriptional errors that result from this process are unintentional.

## 2016-09-04 NOTE — Progress Notes (Signed)
Notified MD Marcille Blanco of 5 beat run of V-tach. Last magnesium level drawn was 2.1 on 12/26; MD to place orders for magnesium lab draw. Nursing staff will continue to monitor for any changes in patient status. Earleen Reaper, RN

## 2016-09-04 NOTE — Clinical Social Work Note (Signed)
CSW received a phone call from the patient's son to discuss dc planning. CSW advised the patient's son that PT is recommending home with home health. The patient's son reported that the patient's home is unihabitable due to lack of care. The patient's son reported that he and the family are in the process of having his mother transferred to the ALF at [facility], but that the transfer could not happen until at least late next week. The CSW asked if the patient can have home health at a family member's home in the duration; however, both family members live at least 2 hours away. The CSW outlined the options: possible STR at a SNF but the family would have to pay out of pocket due to PT recs or return to home with home health including social work, aide, Social research officer, government. The patient's son indicated that he would speak with the rest of the family so that a decision could be made. CSW will con't to follow.  Santiago Bumpers, MSW, LCSW-A [phone]

## 2016-09-05 LAB — MAGNESIUM: Magnesium: 1.9 mg/dL (ref 1.7–2.4)

## 2016-09-05 MED ORDER — AMIODARONE HCL 200 MG PO TABS
400.0000 mg | ORAL_TABLET | Freq: Two times a day (BID) | ORAL | Status: DC
  Administered 2016-09-05 – 2016-09-07 (×4): 400 mg via ORAL
  Filled 2016-09-05 (×4): qty 2

## 2016-09-05 MED ORDER — HYDROCORTISONE 0.5 % EX CREA
TOPICAL_CREAM | Freq: Four times a day (QID) | CUTANEOUS | Status: DC
  Administered 2016-09-05 (×2): 1 via TOPICAL
  Administered 2016-09-05 – 2016-09-07 (×7): via TOPICAL
  Filled 2016-09-05: qty 28.35

## 2016-09-05 NOTE — Progress Notes (Signed)
[place] [facility] [place] NAME: Victoria Contreras    MR#:  [id number]  DATE OF BIRTH:  01/21/1925  SUBJECTIVE: Seen today sitting at bedside, heart rate up to 150s on ambulation so we increased amiodarone. . patient denies any complaints.   CHIEF COMPLAINT:   Chief Complaint  Patient presents with  . Weakness   Admitted with  atrial fibrillation with RVR, CHF. REVIEW OF SYSTEMS:  Review of Systems  Constitutional: Negative for chills, fever and malaise/fatigue.  HENT: Negative for congestion.   Respiratory: Negative for cough, hemoptysis, sputum production, shortness of breath, wheezing and stridor.   Cardiovascular: Negative for chest pain and leg swelling.  Gastrointestinal: Negative for abdominal pain, blood in stool, diarrhea, melena, nausea and vomiting.  Genitourinary: Negative for dysuria and hematuria.  Musculoskeletal: Negative for joint pain.  Skin: Negative for itching and rash.  Neurological: Negative for dizziness, focal weakness, loss of consciousness and weakness.  Psychiatric/Behavioral: Negative for depression. The patient is not nervous/anxious.     DRUG ALLERGIES:   Allergies  Allergen Reactions  . Codeine     Other reaction(s): Unknown  . Sulfa Antibiotics Other (See Comments)    Deathly sick   . Tape Itching    Other reaction(s): Itching of Skin   VITALS:  Blood pressure 114/77, pulse (!) 102, temperature 98.2 F (36.8 C), temperature source Oral, resp. rate 18, height 5\' 9"  (1.753 m), weight 93.8 kg (206 lb 12.8 oz), SpO2 92 %. PHYSICAL EXAMINATION:  Physical Exam  Constitutional: She is oriented to person, place, and time and well-developed, well-nourished, and in no distress.  HENT:  Mouth/Throat: Oropharynx is clear and moist.  Eyes: Conjunctivae and EOM are normal.  Neck: Normal range of motion. Neck supple. No JVD present. No tracheal deviation present.  Cardiovascular: Normal heart sounds.  An irregularly  irregular rhythm present. Tachycardia present.  Exam reveals no gallop.   No murmur heard. Pulmonary/Chest: Effort normal. No respiratory distress. She has no wheezes. She has no rales.  Abdominal: Soft. Bowel sounds are normal. She exhibits no distension. There is no tenderness.  Musculoskeletal: Normal range of motion. She exhibits no edema or tenderness.  Neurological: She is alert and oriented to person, place, and time. No cranial nerve deficit.  Skin: No rash noted. No erythema.  Psychiatric: Affect and judgment normal.   LABORATORY PANEL:   CBC  Recent Labs Lab 09/01/16 0807  WBC 8.2  HGB 12.2  HCT 37.2  PLT 172   ------------------------------------------------------------------------------------------------------------------ Chemistries   Recent Labs Lab 09/02/16 0606 09/05/16 0520  NA 137  --   K 3.8  --   CL 107  --   CO2 23  --   GLUCOSE 100*  --   BUN 17  --   CREATININE 0.76  --   CALCIUM 8.6*  --   MG  --  1.9   RADIOLOGY:  No results found. ASSESSMENT AND PLAN:   This is a 82 y.o. female with a history of Afib RVR on Xarelto, CVA, HTN, HLD now being admitted with:  1. Afib RVR- Tachycardia with minimal ambulation, patient feels short of breath with minimal exertion due to defibrillation, deconditioning: Increase the dose of amiodarone. Monitor in the 24 hours on telemetry.  2.Mild . acute diastolic CHF, new onset, likely related to A. fib, RVR discontinue IV Lasix, start Lasix pills.    - Moderate cardiac enlargement, bilateral pleural effusions and interstitial edema seen on CXR.  3. HTN, controlled. Improved hypertension . -  4. Hyperlidipemia  - Continue medication Simvastatin   5 Hypokalemia. Given KCl. Mag is normal.  6. Acute respiratory failure with hypoxia, due to CHF: Improved. CT chest negative for PE. 7. Pyuria,; All urine culture showed mixed species. discontinue IV Rocephin after 3 days. #8. Deconditioning physical therapy  recommended home health but because patient's son express concern about her living situation social worker is involved, patient's son wanted her to go to respite care  before she goes to assisted living [facility] of the  [facility],   ent plans discussed with the patient, family and they are in agreement.  CODE STATUS: full code.  TOTAL TIME TAKING CARE OF THIS PATIENT: 30 minutes.     POSSIBLE D/C IN 2 DAYS, DEPENDING ON CLINICAL CONDITION.   [name] M.D on 09/05/2016 at 12:40 PM  Between 7am to 6pm - Pager - [phone]  After 6pm go to [email or web address] - Proofreader  [facility]  Office  [phone]  CC: Primary care physician; Dionisio David, MD  Note: This dictation was prepared with Dragon dictation along with smaller phrase technology. Any transcriptional errors that result from this process are unintentional.

## 2016-09-06 LAB — MAGNESIUM: Magnesium: 1.9 mg/dL (ref 1.7–2.4)

## 2016-09-06 LAB — GLUCOSE, CAPILLARY: Glucose-Capillary: 103 mg/dL — ABNORMAL HIGH (ref 65–99)

## 2016-09-06 NOTE — Progress Notes (Signed)
Pt. Slept well throughout the night no no c/o pain, SOB or acute distress noted.

## 2016-09-06 NOTE — Progress Notes (Signed)
[place] [facility] [place] NAME: Victoria Contreras    MR#:  [id number]  DATE OF BIRTH:  05-21-1925   seen at bedside, heart rate up to 130s with ambulation. Denies any shortness of breath or chest pain.   CHIEF COMPLAINT:   Chief Complaint  Patient presents with  . Weakness   Admitted with  atrial fibrillation with RVR, CHF. REVIEW OF SYSTEMS:  Review of Systems  Constitutional: Negative for chills, fever and malaise/fatigue.  HENT: Negative for congestion.   Respiratory: Negative for cough, hemoptysis, sputum production, shortness of breath, wheezing and stridor.   Cardiovascular: Negative for chest pain and leg swelling.  Gastrointestinal: Negative for abdominal pain, blood in stool, diarrhea, melena, nausea and vomiting.  Genitourinary: Negative for dysuria and hematuria.  Musculoskeletal: Negative for joint pain.  Skin: Negative for itching and rash.  Neurological: Negative for dizziness, focal weakness, loss of consciousness and weakness.  Psychiatric/Behavioral: Negative for depression. The patient is not nervous/anxious.     DRUG ALLERGIES:   Allergies  Allergen Reactions  . Codeine     Other reaction(s): Unknown  . Sulfa Antibiotics Other (See Comments)    Deathly sick   . Tape Itching    Other reaction(s): Itching of Skin   VITALS:  Blood pressure 134/70, pulse (!) 101, temperature 97.6 F (36.4 C), temperature source Oral, resp. rate 18, height 5\' 9"  (1.753 m), weight 90 kg (198 lb 6.4 oz), SpO2 92 %. PHYSICAL EXAMINATION:  Physical Exam  Constitutional: She is oriented to person, place, and time and well-developed, well-nourished, and in no distress.  HENT:  Mouth/Throat: Oropharynx is clear and moist.  Eyes: Conjunctivae and EOM are normal.  Neck: Normal range of motion. Neck supple. No JVD present. No tracheal deviation present.  Cardiovascular: Normal heart sounds.  An irregularly irregular rhythm present. Tachycardia  present.  Exam reveals no gallop.   No murmur heard. Pulmonary/Chest: Effort normal. No respiratory distress. She has no wheezes. She has no rales.  Abdominal: Soft. Bowel sounds are normal. She exhibits no distension. There is no tenderness.  Musculoskeletal: Normal range of motion. She exhibits no edema or tenderness.  Neurological: She is alert and oriented to person, place, and time. No cranial nerve deficit.  Skin: No rash noted. No erythema.  Psychiatric: Affect and judgment normal.   LABORATORY PANEL:   CBC  Recent Labs Lab 09/01/16 0807  WBC 8.2  HGB 12.2  HCT 37.2  PLT 172   ------------------------------------------------------------------------------------------------------------------ Chemistries   Recent Labs Lab 09/02/16 0606  09/06/16 0509  NA 137  --   --   K 3.8  --   --   CL 107  --   --   CO2 23  --   --   GLUCOSE 100*  --   --   BUN 17  --   --   CREATININE 0.76  --   --   CALCIUM 8.6*  --   --   MG  --   < > 1.9  < > = values in this interval not displayed. RADIOLOGY:  No results found. ASSESSMENT AND PLAN:   This is a 83 y.o. female with a history of Afib RVR on Xarelto, CVA, HTN, HLD now being admitted with:  1. Afib RVR- Tachycardia with minimal ambulation, adjusted amiodarone dose yesterday.Marland Kitchen  2.Mild . acute diastolic CHF, new onset, likely related to A. fib, RVR discontinue IV Lasix, start Lasix pills.    -  Moderate cardiac enlargement, bilateral pleural effusions and interstitial edema seen on CXR.  3. HTN, controlled. Improved hypertension . -  4. Hyperlidipemia  - Continue medication Simvastatin   5 Hypokalemia. Given KCl. Mag is normal.  6. Acute respiratory failure with hypoxia, due to CHF: Improved. CT chest negative for PE. 7. Pyuria,; All urine culture showed mixed species. discontinue IV Rocephin after 3 days. #8. Deconditioning physical therapy recommended home health but because patient's son express concern about her  living situation social worker is involved, patient's son wanted her to go to respite care  before she goes to assisted living part of the  [facility], likely discharge to assisted living facility at [facility] as per discussion with Education officer, museum.   ent plans discussed with the patient, family and they are in agreement.  CODE STATUS: full code.  TOTAL TIME TAKING CARE OF THIS PATIENT: 30 minutes.     POSSIBLE D/C IN 2 DAYS, DEPENDING ON CLINICAL CONDITION.   [name] M.D on 09/06/2016 at 12:49 PM  Between 7am to 6pm - Pager - [phone]  After 6pm go to [email or web address] - Proofreader  [facility]  Office  [phone]  CC: Primary care physician; Dionisio David, MD  Note: This dictation was prepared with Dragon dictation along with smaller phrase technology. Any transcriptional errors that result from this process are unintentional.

## 2016-09-07 DIAGNOSIS — J90 Pleural effusion, not elsewhere classified: Secondary | ICD-10-CM | POA: Diagnosis not present

## 2016-09-07 DIAGNOSIS — J9601 Acute respiratory failure with hypoxia: Secondary | ICD-10-CM | POA: Diagnosis not present

## 2016-09-07 DIAGNOSIS — E782 Mixed hyperlipidemia: Secondary | ICD-10-CM | POA: Diagnosis not present

## 2016-09-07 DIAGNOSIS — I517 Cardiomegaly: Secondary | ICD-10-CM | POA: Diagnosis not present

## 2016-09-07 DIAGNOSIS — I481 Persistent atrial fibrillation: Secondary | ICD-10-CM | POA: Diagnosis not present

## 2016-09-07 DIAGNOSIS — R531 Weakness: Secondary | ICD-10-CM | POA: Diagnosis not present

## 2016-09-07 DIAGNOSIS — R2681 Unsteadiness on feet: Secondary | ICD-10-CM | POA: Diagnosis not present

## 2016-09-07 DIAGNOSIS — C9112 Chronic lymphocytic leukemia of B-cell type in relapse: Secondary | ICD-10-CM | POA: Diagnosis not present

## 2016-09-07 DIAGNOSIS — I5032 Chronic diastolic (congestive) heart failure: Secondary | ICD-10-CM | POA: Diagnosis not present

## 2016-09-07 DIAGNOSIS — I10 Essential (primary) hypertension: Secondary | ICD-10-CM | POA: Diagnosis not present

## 2016-09-07 DIAGNOSIS — M6281 Muscle weakness (generalized): Secondary | ICD-10-CM | POA: Diagnosis not present

## 2016-09-07 DIAGNOSIS — N390 Urinary tract infection, site not specified: Secondary | ICD-10-CM | POA: Diagnosis not present

## 2016-09-07 DIAGNOSIS — I5040 Unspecified combined systolic (congestive) and diastolic (congestive) heart failure: Secondary | ICD-10-CM | POA: Diagnosis not present

## 2016-09-07 DIAGNOSIS — I4891 Unspecified atrial fibrillation: Secondary | ICD-10-CM | POA: Diagnosis not present

## 2016-09-07 DIAGNOSIS — R4189 Other symptoms and signs involving cognitive functions and awareness: Secondary | ICD-10-CM | POA: Diagnosis not present

## 2016-09-07 DIAGNOSIS — I5033 Acute on chronic diastolic (congestive) heart failure: Secondary | ICD-10-CM | POA: Diagnosis not present

## 2016-09-07 DIAGNOSIS — I5031 Acute diastolic (congestive) heart failure: Secondary | ICD-10-CM | POA: Diagnosis not present

## 2016-09-07 LAB — CBC
HEMATOCRIT: 36.9 % (ref 35.0–47.0)
Hemoglobin: 12.1 g/dL (ref 12.0–16.0)
MCH: 27.1 pg (ref 26.0–34.0)
MCHC: 32.6 g/dL (ref 32.0–36.0)
MCV: 82.9 fL (ref 80.0–100.0)
Platelets: 172 10*3/uL (ref 150–440)
RBC: 4.45 MIL/uL (ref 3.80–5.20)
RDW: 17.1 % — ABNORMAL HIGH (ref 11.5–14.5)
WBC: 8.2 10*3/uL (ref 3.6–11.0)

## 2016-09-07 LAB — BASIC METABOLIC PANEL
ANION GAP: 8 (ref 5–15)
BUN: 15 mg/dL (ref 6–20)
CHLORIDE: 105 mmol/L (ref 101–111)
CO2: 26 mmol/L (ref 22–32)
Calcium: 8.5 mg/dL — ABNORMAL LOW (ref 8.9–10.3)
Creatinine, Ser: 0.8 mg/dL (ref 0.44–1.00)
Glucose, Bld: 107 mg/dL — ABNORMAL HIGH (ref 65–99)
POTASSIUM: 3.3 mmol/L — AB (ref 3.5–5.1)
SODIUM: 139 mmol/L (ref 135–145)

## 2016-09-07 LAB — MAGNESIUM: Magnesium: 1.9 mg/dL (ref 1.7–2.4)

## 2016-09-07 MED ORDER — DILTIAZEM HCL ER COATED BEADS 300 MG PO CP24: 300.0000 mg | ORAL_CAPSULE | Freq: Every day | ORAL | 0 refills | Status: DC

## 2016-09-07 MED ORDER — ROSUVASTATIN CALCIUM 5 MG PO TABS: 5.0000 mg | ORAL_TABLET | Freq: Every day | ORAL | 0 refills | Status: DC

## 2016-09-07 MED ORDER — POTASSIUM CHLORIDE ER 10 MEQ PO TBCR: 10.0000 meq | EXTENDED_RELEASE_TABLET | Freq: Every day | ORAL | 0 refills | Status: DC

## 2016-09-07 MED ORDER — AMIODARONE HCL 400 MG PO TABS: 400.0000 mg | ORAL_TABLET | Freq: Two times a day (BID) | ORAL | 0 refills | Status: DC

## 2016-09-07 MED ORDER — HYDROCORTISONE 0.5 % EX CREA: TOPICAL_CREAM | Freq: Four times a day (QID) | CUTANEOUS | 0 refills | Status: DC

## 2016-09-07 NOTE — Care Management Important Message (Signed)
Important Message  Patient Details  Name: Victoria Contreras MRN: [id number] Date of Birth: [birth year]   Medicare Important Message Given:  Yes  Initial signed IM printed from Epic and given to patient.     Katrina Stack, RN 09/07/2016, 8:52 AM

## 2016-09-07 NOTE — Clinical Social Work Note (Signed)
CSW spoke to patient's son Keita Nam who will transport patient to [facility].  Patient to be d/c'ed today to [facility].  Patient and family agreeable to plans will transport via son's car RN to call report to [phone] room 216.  Evette Cristal, MSW, LCSWA Mon-Fri 8a-4:30p [phone]

## 2016-09-07 NOTE — Progress Notes (Signed)
Patient being discharged to [facility].  Daughter will transport her.  Report called to [facility].  Patient is in controlled A Fib.  No SOB, Denies pain.  Ambulates well ;with her rollanator.  Copies of the hospital summary included in the packet for [facility] and a copy was reviewed with the patient with emphasis on medication changes.

## 2016-09-07 NOTE — NC FL2 (Signed)
[place] LEVEL OF CARE SCREENING TOOL     IDENTIFICATION  Patient Name: Victoria Contreras Birthdate: March 29, 1925 Sex: female Admission Date (Current Location): 08/31/2016  [place] and [place] Number:  Engineering geologist and Address:  [facility], [address] [place], [place] [address]      Provider Number: [id number]  Attending Physician Name and Address:  [name], MD  Relative Name and Phone Number:  [place] Daughter [phone] or [place], [place] [phone]     Current Level of Care: Hospital Recommended Level of Care: [place] Prior Approval Number:    Date Approved/Denied:   PASRR Number: [id number]  Discharge Plan: SNF    Current Diagnoses: Patient Active Problem List   Diagnosis Date Noted  . Atrial fibrillation with RVR ([place]) 09/01/2016  . Atrial fibrillation with rapid ventricular response ([place]) 08/31/2016  . Dysuria 11/06/2015  . Cystocele, grade 2 11/06/2015  . Atrophic vaginitis 11/06/2015  . CLL (chronic lymphoid leukemia) in relapse ([place]) 04/17/2015  . 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    Orientation RESPIRATION BLADDER Height & Weight     Self, Time, Situation, Place  Normal Continent Weight: 198 lb 6.4 oz (90 kg) Height:  5\' 9"  (175.3 cm)  BEHAVIORAL SYMPTOMS/MOOD NEUROLOGICAL BOWEL NUTRITION STATUS      Continent Diet (Cardiac )  AMBULATORY STATUS COMMUNICATION OF NEEDS Skin   Limited Assist Verbally Normal                       Personal Care Assistance Level of Assistance  Bathing, Feeding, Dressing Bathing Assistance: Limited assistance Feeding assistance: Independent Dressing Assistance: Limited assistance     Functional Limitations Info  Sight, Hearing, Speech Sight Info: Adequate Hearing Info: Adequate Speech Info: Adequate    SPECIAL CARE FACTORS FREQUENCY  PT (By licensed PT)     PT Frequency: Minimum 2x a week               Contractures      Additional Factors Info  Code Status, Allergies Code Status Info: Full  Allergies Info: CODEINE, SULFA ANTIBIOTICS, TAPE            Current Medications (09/07/2016):  This is the current hospital active medication list Current Facility-Administered Medications  Medication Dose Route Frequency Provider Last Rate Last Dose  . acetaminophen (TYLENOL) tablet 650 mg  650 mg Oral Q4H PRN Alexis Hugelmeyer, DO      . allopurinol (ZYLOPRIM) tablet 300 mg  300 mg Oral Daily Alexis Hugelmeyer, DO   300 mg at 09/06/16 1116  . amiodarone (PACERONE) tablet 400 mg  400 mg Oral BID Teodoro Spray, MD   400 mg at 09/06/16 2150  . cefTRIAXone (ROCEPHIN) IVPB 1 g  1 g Intravenous Q24H Theodoro Grist, MD   1 g at 09/06/16 1621  . diltiazem (CARDIZEM CD) 24 hr capsule 300 mg  300 mg Oral Daily Corey Skains, MD   300 mg at 09/06/16 1115  . furosemide (LASIX) tablet 20 mg  20 mg Oral BID [name], MD   20 mg at 09/07/16 0750  . hydrocortisone cream 0.5 %   Topical QID [name], MD      . MEDLINE mouth rinse  15 mL Mouth Rinse BID Alexis Hugelmeyer, DO   15 mL at 09/06/16 2150  . ondansetron (ZOFRAN) injection 4 mg  4 mg Intravenous Q6H PRN Alexis Hugelmeyer, DO      . rivaroxaban (XARELTO) tablet 20  mg  20 mg Oral Q supper Alexis Hugelmeyer, DO   20 mg at 09/06/16 1620  . simvastatin (ZOCOR) tablet 20 mg  20 mg Oral Daily Alexis Hugelmeyer, DO   20 mg at 09/06/16 1116     Discharge Medications: Please see discharge summary for a list of discharge medications.  Relevant Imaging Results:  Relevant Lab Results:   Additional Information SSN [id number]  Ross Ludwig, [place]

## 2016-09-07 NOTE — Care Management (Signed)
Informed during progression that patient is to discharge to skilled nursing at discharge and not home with home health.  She presents from independent living at [facility].  Family will transition patient to an assisted living environment after discharge from snf.

## 2016-09-07 NOTE — Progress Notes (Signed)
Physical Therapy Treatment Patient Details Name: Victoria Contreras MRN: [id number] DOB: 1925/07/27 Today's Date: 09/07/2016    History of Present Illness Masel Haning is a 83 y.o. female with a known history of Afib RVR on Xarelto, CVA, HTN presents to the emergency department for evaluation of shortness of breath.  Patient was in a usual state of health until 11 hours prior to arrival. She awoke today short of breath, she has not tried anything to relieve this because she thought it would resolve on its own. It was associated with intermittent palpitations. She states it is worse with walking or exertion. It worsened throughout the day. She felt unstable, weak and had to hold on to the wall when walking approximately 5 hours to arrival. Her daughter took her to [facility] and was found to be in Afib RVR and sent her the the ED. She denies fever, chills, nausea, vomiting, dizziness, altered mental status, recent falls, racing heart, chest pain or tightness. She received the flu shot and is current on her pneumonia vaccines. She lives in an independent living facility.   Of note, according to her daughter her functional capacity has been limited recently, and she is having trouble with hoarding in her home and personal hygiene.  Social work is involved. Patient recently was treated for a UTI with Cipro. She believes this is causing her to have diarrhea with meals. She experienced two episodes of diarrhea today immediately after eating lunch. She reports that this is not unusual for her.  Her diet has been limited to Saltine crackers due to nausea side effect from antibiotic. Patient has been taking medication as prescribed and there has been no other recent change in medication or diet.  There has been no recent illness, travel or sick contacts.      PT Comments    Pt reviewed LE exercises after walking and noted controlled O2 sats but very tired.  Her plan is to transition to SNF after this admission  and will be continuing strengthening and gait training.  Pt is motivated and worked hard today, will continue acutely as needed for further progression of gait and balance with focus on safety such as with rollator brakes.  Follow Up Recommendations  Home health PT     Equipment Recommendations  None recommended by PT    Recommendations for Other Services       Precautions / Restrictions Precautions Precautions: None Restrictions Weight Bearing Restrictions: No    Mobility  Bed Mobility Overal bed mobility: Modified Independent             General bed mobility comments: rails used, good hand placeemtn  Transfers Overall transfer level: Needs assistance Equipment used: Rolling walker (2 wheeled);1 person hand held assist Transfers: Sit to/from Stand;Stand Pivot Transfers Sit to Stand: Min guard Stand pivot transfers: Min guard       General transfer comment: tolerating sit to stand and controlling sitting well from rollator  Ambulation/Gait Ambulation/Gait assistance: Min guard Ambulation Distance (Feet): 90 Feet (x 2 with sitting rest on rollator) Assistive device: 4-wheeled walker Gait Pattern/deviations: Decreased stride length;Wide base of support (tends to let walker get too far out in front) Gait velocity: reduced speed Gait velocity interpretation: Below normal speed for age/gender General Gait Details: O2 sats were controlled over 90% with all mobility on RW   Stairs            Wheelchair Mobility    Modified Rankin (Stroke Patients Only)  Balance Overall balance assessment: Needs assistance Sitting-balance support: Feet supported Sitting balance-Leahy Scale: Good   Postural control: Posterior lean Standing balance support: Bilateral upper extremity supported Standing balance-Leahy Scale: Fair                      Cognition Arousal/Alertness: Awake/alert Behavior During Therapy: WFL for tasks assessed/performed Overall  Cognitive Status: Within Functional Limits for tasks assessed                      Exercises      General Comments        Pertinent Vitals/Pain Pain Assessment: No/denies pain    Home Living Family/patient expects to be discharged to:: [place]: Alone                  Prior Function            PT Goals (current goals can now be found in the care plan section) Acute Rehab PT Goals Patient Stated Goal: Return to prior level of function at home Progress towards PT goals: Progressing toward goals    Frequency    Min 2X/week      PT Plan Current plan remains appropriate    Co-evaluation             End of Session Equipment Utilized During Treatment: Gait belt Activity Tolerance: Patient tolerated treatment well Patient left: in bed;with call bell/phone within reach;with bed alarm set     Time: [id number] PT Time Calculation (min) (ACUTE ONLY): 36 min  Charges:  $Gait Training: 8-22 mins $Therapeutic Exercise: 8-22 mins                    G Codes:      Ramond Dial Sep 28, 2016, 5:47 PM   Mee Hives, PT MS Acute Rehab Dept. Number: [place] and [place]

## 2016-09-07 NOTE — Discharge Summary (Addendum)
Victoria Contreras, is a 83 y.o. female  DOB Nov 24, 1924  MRN [id number].  Admission date:  08/31/2016  Admitting Physician  Harvie Bridge, DO  Discharge Date:  09/07/2016   Primary MD  Dionisio David, MD  Recommendations for primary care physician for things to follow:  Follow-up with primary doctor in one week Follow up with primary cardiologist Dr. Ubaldo Glassing in 2 weeks.   Admission Diagnosis  Acute pulmonary edema (HCC) [J81.0] Atrial fibrillation with rapid ventricular response (HCC) [I48.91] Generalized weakness [R53.1]   Discharge Diagnosis  Acute pulmonary edema ([place]) [J81.0] Atrial fibrillation with rapid ventricular response (HCC) [I48.91] Generalized weakness [R53.1]    Active Problems:   Atrial fibrillation with rapid ventricular response (HCC)   Atrial fibrillation with RVR (HCC)      Past Medical History:  Diagnosis Date  . Atrial fibrillation ([place])   . Cataracts, both eyes   . CLL (chronic lymphocytic leukemia) ([place])   . CLL (chronic lymphoid leukemia) in relapse ([place]) 04/17/2015  . CVA (cerebral infarction)   . Frequent nosebleeds   . Gout   . Heart attack   . Heart burn   . Hemangiopericytoma   . HLD (hyperlipidemia)   . HTN (hypertension)   . UTI (lower urinary tract infection)     Past Surgical History:  Procedure Laterality Date  . APPENDECTOMY    . BREAST LUMPECTOMY    . CATARACT EXTRACTION BILATERAL W/ ANTERIOR VITRECTOMY     right/left eye  . CHOLECYSTECTOMY    . DILATION AND CURETTAGE OF UTERUS    . hemangiopericytoma resection    . TONSILLECTOMY    . TOTAL KNEE ARTHROPLASTY     right knee       History of present illness and  Hospital Course:     Kindly see H&P for history of present illness and admission details, please review complete Labs, Consult reports and Test reports  for all details in brief  HPI  from the history and physical done on the day of admission 83 year old female patient with history of atrial fibrillation with anticoagulation on Xarelto, CVA, hypertension comes because of shortness of breath, intermittent palpitations, generalized weakness. On to have atrial fibrillation with heart rate up to 1 16 bpm on admission.   Hospital Course  #1 A. fib with RVR multifactorial secondary to poor diet for 1 week, recent urine infection, generalized weakness:  patient received Cardizem, Lopressor, initially admitted to intensive care unit started on Cardizem drip, seen by cardiology, Dr. Nehemiah Massed, .  resumed the metoprolol, started on amiodarone also. Continue Xarelto for risk reduction of stroke with atrial fibrillation. Patient echocardiogram is done Echocardiogram showed EF 70%. Has moderate mitral regurgitation. Patient followed by cardiology, presently she is on Cardizem CD 300 mg daily, amiodarone 400 mg by mouth twice a day, heart rate has been stable 90s to 100. Patient can follow-up with cardiology as an outpatient, continue amiodarone, Cardizem.  2.CHF:mild acute on chronic diastolic heart failure: Likely due to atrial fibrillation with RVR: Patient received IV Lasix, during this hospitalization, now has no shortness of breath. Can resume  Home dose hydrochlorothiazide,  #3 hyperlipidemia: Continue statins   #4 hypokalemia replaced with potassium.   Acute respiratory failure with hypoxia secondary to CHF: CT chest negative for PE. Has no further hypoxia, oxygen saturation is more than 90% on room air.  #5 disposition ;[facility]   #6. pyuria ;urine cultures did not show any infection, received IV Rocephin for 3 days.  Discharge Condition: stable   Follow UP  Contact information for after-discharge care    Destination    [facility] .   Specialties:  [place], [place] Contact information: [place] [phone]                Discharge Instructions  and  Discharge Medications;   stable   Allergies as of 09/07/2016      Reactions   Codeine    Other reaction(s): Unknown   Sulfa Antibiotics Other (See Comments)   Deathly sick    Tape Itching   Other reaction(s): Itching of Skin      Medication List    STOP taking these medications   phenazopyridine 100 MG tablet Commonly known as:  PYRIDIUM   simvastatin 20 MG tablet Commonly known as:  ZOCOR     TAKE these medications   acetaminophen 500 MG tablet Commonly known as:  TYLENOL Take 500 mg by mouth every 4 (four) hours as needed for mild pain or fever.   allopurinol 300 MG tablet Commonly known as:  ZYLOPRIM Take 300 mg by mouth daily.   amiodarone 400 MG tablet Commonly known as:  PACERONE Take 1 tablet (400 mg total) by mouth 2 (two) times daily.   diltiazem 300 MG 24 hr capsule Commonly known as:  CARDIZEM CD Take 1 capsule (300 mg total) by mouth daily. Start taking on:  09/08/2016   ESTRACE VAGINAL 0.1 MG/GM vaginal cream Generic drug:  estradiol USE AS INSTRUCTED BY YOUR PRESCRIBER   hydrochlorothiazide 25 MG tablet Commonly known as:  HYDRODIURIL Take 25 mg by mouth every morning.   hydrocortisone cream 0.5 % Apply topically 4 (four) times daily.   naproxen sodium 220 MG tablet Commonly known as:  ANAPROX Take 440 mg by mouth 2 (two) times daily with a meal. Reported on 11/05/2015   potassium chloride 10 MEQ tablet Commonly known as:  K-DUR Take 1 tablet (10 mEq total) by mouth daily.   rivaroxaban 20 MG Tabs tablet Commonly known as:  XARELTO Take 20 mg by mouth daily with supper.   rosuvastatin 5 MG tablet Commonly known as:  CRESTOR Take 1 tablet (5 mg total) by mouth daily.         Diet and Activity recommendation: See Discharge Instructions above   Consults obtained - Cardiology, social worker, physical therapy   Major  procedures and Radiology Reports - PLEASE review detailed and final reports for all details, in brief -      Ct Angio Chest Pe W Or Wo Contrast  Result Date: 09/02/2016 CLINICAL DATA:  Acute respiratory failure with hypoxia. EXAM: CT ANGIOGRAPHY CHEST WITH CONTRAST TECHNIQUE: Multidetector CT imaging of the chest was performed using the standard protocol during bolus administration of intravenous contrast. Multiplanar CT image reconstructions and MIPs were obtained to evaluate the vascular anatomy. CONTRAST:  75 mL of Isovue 370 intravenous contrast COMPARISON:  Chest radiograph, 08/31/2016.  Chest CT, 10/11/2014 FINDINGS: Cardiovascular: Satisfactory opacification of the pulmonary arteries to the segmental level. No evidence of pulmonary embolism. Heart is mildly enlarged. There are moderate coronary artery calcifications. The great vessels normal in caliber. There are atherosclerotic calcifications along the thoracic aorta and its branch vessels. No dissection. A small amount of pericardial fluid collects adjacent to the ascending aorta. Mediastinum/Nodes: Mild enlargement of the right thyroid lobe, stable from the prior CT. No neck base or axillary masses or adenopathy. There are scattered  prominent mediastinal lymph nodes, the largest an 18 window node measuring 11 mm in short axis. No mediastinal or hilar masses. Trachea is widely patent. Lungs/Pleura: Small right and minimal left pleural effusions. There is bilateral interstitial thickening. There are small areas of ill-defined ground-glass like opacity in both lungs. There is dependent opacity in the right lower lobe adjacent to the pleural effusion which is likely atelectasis. Mild subsegmental atelectasis is noted in the dependent left lower lobe. There is mild stable paraseptal emphysema in the upper lobes. No pneumothorax. Upper Abdomen: No acute abnormality. Musculoskeletal: No chest wall abnormality. No acute or significant osseous findings.  Review of the MIP images confirms the above findings. IMPRESSION: 1. No evidence of a pulmonary embolus. 2. Findings are consistent with congestive heart failure with cardiomegaly, pleural effusions, and interstitial edema with small areas of airspace edema. Electronically Signed   By: Lajean Manes M.D.   On: 09/02/2016 15:56   Dg Chest Portable 1 View  Result Date: 08/31/2016 CLINICAL DATA:  Shortness of breath and weakness. EXAM: PORTABLE CHEST 1 VIEW COMPARISON:  10/11/2014 FINDINGS: Moderate cardiac enlargement. Aortic atherosclerosis noted. Pleural effusions identified right greater in left. There is mild diffuse interstitial edema. IMPRESSION: 1. Mild congestive heart failure. Electronically Signed   By: Kerby Moors M.D.   On: 08/31/2016 17:39    Micro Results    Recent Results (from the past 240 hour(s))  MRSA PCR Screening     Status: None   Collection Time: 08/31/16  4:46 PM  Result Value Ref Range Status   MRSA by PCR NEGATIVE NEGATIVE Final    Comment:        The GeneXpert MRSA Assay (FDA approved for NASAL specimens only), is one component of a comprehensive MRSA colonization surveillance program. It is not intended to diagnose MRSA infection nor to guide or monitor treatment for MRSA infections.   Urine culture     Status: Abnormal   Collection Time: 09/02/16  6:58 PM  Result Value Ref Range Status   Specimen Description URINE, RANDOM  Final   Special Requests NONE  Final   Culture MULTIPLE SPECIES PRESENT, SUGGEST RECOLLECTION (A)  Final   Report Status 09/04/2016 FINAL  Final       Today   Subjective:   Ross Ludwig today has No chest pain, no shortness of breath, stable for discharge to  [facility].  Objective:   Blood pressure 117/75, pulse 99, temperature 97.4 F (36.3 C), temperature source Oral, resp. rate 18, height 5\' 9"  (1.753 m), weight 90 kg (198 lb 6.4 oz), SpO2 94 %.   Intake/Output Summary (Last 24 hours) at 09/07/16 1424 Last data  filed at 09/07/16 1308  Gross per 24 hour  Intake              890 ml  Output             1425 ml  Net             -535 ml    Exam Awake Alert, Oriented x 3, No new F.N deficits, Normal affect [place].AT,PERRAL Supple Neck,No JVD, No cervical lymphadenopathy appriciated.  Symmetrical Chest wall movement, Good air movement bilaterally, CTAB RRR,No Gallops,Rubs or new Murmurs, No Parasternal Heave +ve B.Sounds, Abd Soft, Non tender, No organomegaly appriciated, No rebound -guarding or rigidity. No Cyanosis, Clubbing or edema, No new Rash or bruise  Data Review   CBC w Diff:  Lab Results  Component Value Date   WBC 8.2  09/07/2016   HGB 12.1 09/07/2016   HGB 13.1 10/11/2014   HCT 36.9 09/07/2016   HCT 39.7 10/11/2014   PLT 172 09/07/2016   PLT 190 10/11/2014   LYMPHOPCT 23 04/17/2015   LYMPHOPCT 26.4 10/11/2014   MONOPCT 9 04/17/2015   MONOPCT 7.3 10/11/2014   EOSPCT 2 04/17/2015   EOSPCT 2.7 10/11/2014   BASOPCT 3 04/17/2015   BASOPCT 1.3 10/11/2014    CMP:  Lab Results  Component Value Date   NA 139 09/07/2016   NA 141 12/29/2013   K 3.3 (L) 09/07/2016   K 3.7 12/29/2013   CL 105 09/07/2016   CL 109 (H) 12/29/2013   CO2 26 09/07/2016   CO2 27 12/29/2013   BUN 15 09/07/2016   BUN 12 12/29/2013   CREATININE 0.80 09/07/2016   CREATININE 0.84 10/11/2014   PROT 7.0 12/27/2013   ALBUMIN 3.5 12/27/2013   BILITOT 0.3 12/27/2013   ALKPHOS 84 12/27/2013   AST 37 12/27/2013   ALT 37 12/27/2013  .   Total Time in preparing paper work, data evaluation and todays exam - 43 minutes  Deanna Boehlke M.D on 09/07/2016 at 2:24 PM    Note: This dictation was prepared with Dragon dictation along with smaller phrase technology. Any transcriptional errors that result from this process are unintentional.

## 2016-09-07 NOTE — Clinical Social Work Placement (Signed)
   CLINICAL SOCIAL WORK PLACEMENT  NOTE  Date:  09/07/2016  Patient Details  Name: Victoria Contreras MRN: [id number] Date of Birth: 26-May-1925  Clinical Social Work is seeking post-discharge placement for this patient at the [place] level of care (*CSW will initial, date and re-position this form in  chart as items are completed):  Yes   Patient/family provided with [place] Work Department's list of facilities offering this level of care within the geographic area requested by the patient (or if unable, by the patient's family).  Yes   Patient/family informed of their freedom to choose among providers that offer the needed level of care, that participate in Medicare, Medicaid or managed care program needed by the patient, have an available bed and are willing to accept the patient.  Yes   Patient/family informed of [place]'s ownership interest in [facility] and [facility], as well as of the fact that they are under no obligation to receive care at these facilities.  PASRR submitted to EDS on 09/07/16     PASRR number received on 09/07/16     Existing PASRR number confirmed on       FL2 transmitted to all facilities in geographic area requested by pt/family on 09/07/16     FL2 transmitted to all facilities within larger geographic area on       Patient informed that his/her managed care company has contracts with or will negotiate with certain facilities, including the following:        Yes   Patient/family informed of bed offers received.  Patient chooses bed at [facility]     Physician recommends and patient chooses bed at      Patient to be transferred to [facility] on 09/07/16.  Patient to be transferred to facility by Patient's family will transport via personal car     Patient family notified on 09/07/16 of transfer.  Name of family member notified:  Ginnie Smart     PHYSICIAN       Additional Comment:     _______________________________________________ Ross Ludwig, LCSWA 09/07/2016, 4:06 PM

## 2016-09-08 DIAGNOSIS — I5033 Acute on chronic diastolic (congestive) heart failure: Secondary | ICD-10-CM

## 2016-09-08 DIAGNOSIS — I4891 Unspecified atrial fibrillation: Secondary | ICD-10-CM

## 2016-09-14 DIAGNOSIS — N390 Urinary tract infection, site not specified: Secondary | ICD-10-CM

## 2016-09-14 DIAGNOSIS — C9112 Chronic lymphocytic leukemia of B-cell type in relapse: Secondary | ICD-10-CM | POA: Diagnosis not present

## 2016-09-14 DIAGNOSIS — E785 Hyperlipidemia, unspecified: Secondary | ICD-10-CM

## 2016-09-14 DIAGNOSIS — I10 Essential (primary) hypertension: Secondary | ICD-10-CM | POA: Diagnosis not present

## 2016-09-14 DIAGNOSIS — I5032 Chronic diastolic (congestive) heart failure: Secondary | ICD-10-CM | POA: Diagnosis not present

## 2016-09-14 DIAGNOSIS — I481 Persistent atrial fibrillation: Secondary | ICD-10-CM | POA: Diagnosis not present

## 2016-09-16 DIAGNOSIS — I10 Essential (primary) hypertension: Secondary | ICD-10-CM | POA: Diagnosis not present

## 2016-09-16 DIAGNOSIS — E782 Mixed hyperlipidemia: Secondary | ICD-10-CM | POA: Diagnosis not present

## 2016-09-16 DIAGNOSIS — I4891 Unspecified atrial fibrillation: Secondary | ICD-10-CM | POA: Diagnosis not present

## 2016-09-20 DIAGNOSIS — M6281 Muscle weakness (generalized): Secondary | ICD-10-CM | POA: Diagnosis not present

## 2016-09-20 DIAGNOSIS — R2681 Unsteadiness on feet: Secondary | ICD-10-CM | POA: Diagnosis not present

## 2016-09-23 DIAGNOSIS — R2681 Unsteadiness on feet: Secondary | ICD-10-CM | POA: Diagnosis not present

## 2016-09-23 DIAGNOSIS — M6281 Muscle weakness (generalized): Secondary | ICD-10-CM | POA: Diagnosis not present

## 2016-09-24 DIAGNOSIS — M6281 Muscle weakness (generalized): Secondary | ICD-10-CM | POA: Diagnosis not present

## 2016-09-24 DIAGNOSIS — R2681 Unsteadiness on feet: Secondary | ICD-10-CM | POA: Diagnosis not present

## 2016-09-24 DIAGNOSIS — I481 Persistent atrial fibrillation: Secondary | ICD-10-CM | POA: Diagnosis not present

## 2016-09-25 DIAGNOSIS — R2681 Unsteadiness on feet: Secondary | ICD-10-CM | POA: Diagnosis not present

## 2016-09-25 DIAGNOSIS — M6281 Muscle weakness (generalized): Secondary | ICD-10-CM | POA: Diagnosis not present

## 2016-09-27 DIAGNOSIS — R2681 Unsteadiness on feet: Secondary | ICD-10-CM | POA: Diagnosis not present

## 2016-09-27 DIAGNOSIS — M6281 Muscle weakness (generalized): Secondary | ICD-10-CM | POA: Diagnosis not present

## 2016-09-29 DIAGNOSIS — M6281 Muscle weakness (generalized): Secondary | ICD-10-CM | POA: Diagnosis not present

## 2016-09-29 DIAGNOSIS — R2681 Unsteadiness on feet: Secondary | ICD-10-CM | POA: Diagnosis not present

## 2016-10-01 DIAGNOSIS — R2681 Unsteadiness on feet: Secondary | ICD-10-CM | POA: Diagnosis not present

## 2016-10-01 DIAGNOSIS — M6281 Muscle weakness (generalized): Secondary | ICD-10-CM | POA: Diagnosis not present

## 2016-10-04 DIAGNOSIS — I481 Persistent atrial fibrillation: Secondary | ICD-10-CM | POA: Diagnosis not present

## 2016-10-07 DIAGNOSIS — I10 Essential (primary) hypertension: Secondary | ICD-10-CM | POA: Diagnosis not present

## 2016-10-07 DIAGNOSIS — E7801 Familial hypercholesterolemia: Secondary | ICD-10-CM | POA: Diagnosis not present

## 2016-10-07 DIAGNOSIS — R600 Localized edema: Secondary | ICD-10-CM | POA: Diagnosis not present

## 2016-10-07 DIAGNOSIS — I481 Persistent atrial fibrillation: Secondary | ICD-10-CM | POA: Diagnosis not present

## 2016-10-15 DIAGNOSIS — R4189 Other symptoms and signs involving cognitive functions and awareness: Secondary | ICD-10-CM | POA: Diagnosis not present

## 2016-10-15 DIAGNOSIS — R2681 Unsteadiness on feet: Secondary | ICD-10-CM | POA: Diagnosis not present

## 2016-10-15 DIAGNOSIS — M6281 Muscle weakness (generalized): Secondary | ICD-10-CM | POA: Diagnosis not present

## 2016-10-30 ENCOUNTER — Emergency Department
Admission: EM | Admit: 2016-10-30 | Discharge: 2016-10-30 | Disposition: A | Discharge status: Home / Self Care | Payer: Medicare Other | Attending: Emergency Medicine | Admitting: Emergency Medicine

## 2016-10-30 ENCOUNTER — Emergency Department: Payer: Medicare Other

## 2016-10-30 DIAGNOSIS — R531 Weakness: Secondary | ICD-10-CM

## 2016-10-30 DIAGNOSIS — Z791 Long term (current) use of non-steroidal anti-inflammatories (NSAID): Secondary | ICD-10-CM | POA: Diagnosis not present

## 2016-10-30 DIAGNOSIS — I959 Hypotension, unspecified: Secondary | ICD-10-CM | POA: Diagnosis not present

## 2016-10-30 DIAGNOSIS — Z87891 Personal history of nicotine dependence: Secondary | ICD-10-CM | POA: Diagnosis not present

## 2016-10-30 DIAGNOSIS — Z79899 Other long term (current) drug therapy: Secondary | ICD-10-CM | POA: Diagnosis not present

## 2016-10-30 DIAGNOSIS — Z7401 Bed confinement status: Secondary | ICD-10-CM | POA: Diagnosis not present

## 2016-10-30 DIAGNOSIS — E876 Hypokalemia: Secondary | ICD-10-CM

## 2016-10-30 DIAGNOSIS — I10 Essential (primary) hypertension: Secondary | ICD-10-CM | POA: Diagnosis not present

## 2016-10-30 LAB — URINALYSIS, COMPLETE (UACMP) WITH MICROSCOPIC
BACTERIA UA: NONE SEEN
BILIRUBIN URINE: NEGATIVE
Glucose, UA: NEGATIVE mg/dL
HGB URINE DIPSTICK: NEGATIVE
Ketones, ur: NEGATIVE mg/dL
LEUKOCYTES UA: NEGATIVE
Nitrite: NEGATIVE
PROTEIN: NEGATIVE mg/dL
RBC / HPF: NONE SEEN RBC/hpf (ref 0–5)
Specific Gravity, Urine: 1.006 (ref 1.005–1.030)
pH: 7 (ref 5.0–8.0)

## 2016-10-30 LAB — COMPREHENSIVE METABOLIC PANEL
ALBUMIN: 3.5 g/dL (ref 3.5–5.0)
ALK PHOS: 68 U/L (ref 38–126)
ALT: 24 U/L (ref 14–54)
AST: 49 U/L — AB (ref 15–41)
Anion gap: 12 (ref 5–15)
BUN: 24 mg/dL — AB (ref 6–20)
CALCIUM: 8.8 mg/dL — AB (ref 8.9–10.3)
CHLORIDE: 95 mmol/L — AB (ref 101–111)
CO2: 28 mmol/L (ref 22–32)
CREATININE: 1 mg/dL (ref 0.44–1.00)
GFR calc Af Amer: 56 mL/min — ABNORMAL LOW (ref >60)
GFR calc non Af Amer: 48 mL/min — ABNORMAL LOW (ref >60)
GLUCOSE: 153 mg/dL — AB (ref 65–99)
Potassium: 2.6 mmol/L — CL (ref 3.5–5.1)
SODIUM: 135 mmol/L (ref 135–145)
Total Bilirubin: 0.6 mg/dL (ref 0.3–1.2)
Total Protein: 6.9 g/dL (ref 6.5–8.1)

## 2016-10-30 LAB — CBC WITH DIFFERENTIAL/PLATELET
Basophils Absolute: 0 10*3/uL (ref 0–0.1)
Basophils Relative: 0 %
EOS PCT: 0 %
Eosinophils Absolute: 0 10*3/uL (ref 0–0.7)
HEMATOCRIT: 33.5 % — AB (ref 35.0–47.0)
Hemoglobin: 10.6 g/dL — ABNORMAL LOW (ref 12.0–16.0)
LYMPHS ABS: 2.4 10*3/uL (ref 1.0–3.6)
LYMPHS PCT: 19 %
MCH: 23.7 pg — AB (ref 26.0–34.0)
MCHC: 31.7 g/dL — ABNORMAL LOW (ref 32.0–36.0)
MCV: 74.7 fL — AB (ref 80.0–100.0)
Monocytes Absolute: 0.7 10*3/uL (ref 0.2–0.9)
Monocytes Relative: 6 %
NEUTROS ABS: 9.2 10*3/uL — AB (ref 1.4–6.5)
Neutrophils Relative %: 75 %
Platelets: 271 10*3/uL (ref 150–440)
RBC: 4.49 MIL/uL (ref 3.80–5.20)
RDW: 17.8 % — ABNORMAL HIGH (ref 11.5–14.5)
WBC: 12.3 10*3/uL — ABNORMAL HIGH (ref 3.6–11.0)

## 2016-10-30 LAB — CK: CK TOTAL: 32 U/L — AB (ref 38–234)

## 2016-10-30 LAB — BRAIN NATRIURETIC PEPTIDE: B Natriuretic Peptide: 346 pg/mL — ABNORMAL HIGH (ref 0.0–100.0)

## 2016-10-30 LAB — TSH: TSH: 1.309 u[IU]/mL (ref 0.350–4.500)

## 2016-10-30 LAB — TROPONIN I

## 2016-10-30 MED ORDER — POTASSIUM CHLORIDE 20 MEQ/15ML (10%) PO SOLN
60.0000 meq | Freq: Once | ORAL | Status: AC
  Administered 2016-10-30: 60 meq via ORAL
  Filled 2016-10-30: qty 45

## 2016-10-30 MED ORDER — SODIUM CHLORIDE 0.9 % IV BOLUS (SEPSIS)
1000.0000 mL | Freq: Once | INTRAVENOUS | Status: AC
  Administered 2016-10-30: 1000 mL via INTRAVENOUS

## 2016-10-30 MED ORDER — POTASSIUM CHLORIDE ER 10 MEQ PO TBCR: 20.0000 meq | EXTENDED_RELEASE_TABLET | Freq: Every day | ORAL | 0 refills | Status: DC

## 2016-10-30 NOTE — ED Provider Notes (Signed)
[facility] Emergency Department Provider Note  ____________________________________________   First MD Initiated Contact with Patient 10/30/16 1536     (approximate)  I have reviewed the triage vital signs and the nursing notes.   HISTORY  Chief Complaint No chief complaint on file.    HPI Victoria Contreras is a 83 y.o. female who is brought to the emergency department via EMS against her will for weakness and fatigue for the past several days. She lives at [facility] and for the last several days has been unable to ambulate on her own brought her to the emergency department. She denies chest pain shortness of breath abdominal pain nausea or vomiting. She denies fevers or chills. She denies dysuria frequency or hesitancy. She was recently started on 20 mg daily Lasix over the past month or so has lost 20 pounds.   Past Medical History:  Diagnosis Date  . Atrial fibrillation ([place])   . Cataracts, both eyes   . CLL (chronic lymphocytic leukemia) ([place])   . CLL (chronic lymphoid leukemia) in relapse ([place]) 04/17/2015  . CVA (cerebral infarction)   . Frequent nosebleeds   . Gout   . Heart attack   . Heart burn   . Hemangiopericytoma   . HLD (hyperlipidemia)   . HTN (hypertension)   . UTI (lower urinary tract infection)     Patient Active Problem List   Diagnosis Date Noted  . Atrial fibrillation with RVR ([place]) 09/01/2016  . Atrial fibrillation with rapid ventricular response ([place]) 08/31/2016  . Dysuria 11/06/2015  . Cystocele, grade 2 11/06/2015  . Atrophic vaginitis 11/06/2015  . CLL (chronic lymphoid leukemia) in relapse ([place]) 04/17/2015  . 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 APPENDECTOMY    . BREAST LUMPECTOMY    . CATARACT EXTRACTION BILATERAL W/ ANTERIOR VITRECTOMY     right/left eye  . CHOLECYSTECTOMY    . DILATION AND CURETTAGE OF UTERUS    . hemangiopericytoma resection    .  TONSILLECTOMY    . TOTAL KNEE ARTHROPLASTY     right knee    Prior to Admission medications   Medication Sig Start Date End Date Taking? Authorizing Provider  acetaminophen (TYLENOL) 500 MG tablet Take 500 mg by mouth every 4 (four) hours as needed for mild pain or fever.   Yes Historical Provider, MD  allopurinol (ZYLOPRIM) 300 MG tablet Take 300 mg by mouth daily.   Yes Historical Provider, MD  alum & mag hydroxide-simeth (MAALOX/MYLANTA) 200-200-20 MG/5ML suspension Take 30 mLs by mouth every 4 (four) hours as needed for indigestion or heartburn.   Yes Historical Provider, MD  amiodarone (PACERONE) 400 MG tablet Take 1 tablet (400 mg total) by mouth 2 (two) times daily. 09/07/16  Yes [name], MD  bismuth subsalicylate (KAOPECTATE) 262 MG/15ML suspension Take 10 mLs by mouth 3 (three) times daily as needed.   Yes Historical Provider, MD  carbamide peroxide (DEBROX) 6.5 % otic solution Place 5 drops into both ears as needed.   Yes Historical Provider, MD  diltiazem (CARDIZEM CD) 300 MG 24 hr capsule Take 1 capsule (300 mg total) by mouth daily. 09/08/16  Yes [name], MD  diphenhydrAMINE (BENADRYL) 25 MG tablet Take 25 mg by mouth every 4 (four) hours as needed.   Yes Historical Provider, MD  furosemide (LASIX) 20 MG tablet Take 20 mg by mouth daily.   Yes Historical Provider, MD  guaifenesin (ROBITUSSIN) 100 MG/5ML syrup  Take 200 mg by mouth every 4 (four) hours as needed for cough.   Yes Historical Provider, MD  hydrochlorothiazide (HYDRODIURIL) 25 MG tablet Take 25 mg by mouth every morning.   Yes Historical Provider, MD  hydrocortisone cream 0.5 % Apply topically 4 (four) times daily. 09/07/16  Yes [name], MD  magnesium hydroxide (MILK OF MAGNESIA) 400 MG/5ML suspension Take 30 mLs by mouth daily as needed for mild constipation.   Yes Historical Provider, MD  metoprolol succinate (TOPROL-XL) 25 MG 24 hr tablet Take 25 mg by mouth daily.   Yes Historical  Provider, MD  naproxen sodium (ANAPROX) 220 MG tablet Take 440 mg by mouth 2 (two) times daily with a meal. Reported on 11/05/2015   Yes Historical Provider, MD  nystatin (MYCOSTATIN/NYSTOP) powder Apply topically as needed.   Yes Historical Provider, MD  oseltamivir (TAMIFLU) 75 MG capsule Take 75 mg by mouth daily. 10/27/16 11/05/16 Yes Historical Provider, MD  potassium chloride (K-DUR) 10 MEQ tablet Take 1 tablet (10 mEq total) by mouth daily. Patient taking differently: Take 20 mEq by mouth daily.  09/07/16  Yes [name], MD  rivaroxaban (XARELTO) 20 MG TABS tablet Take 20 mg by mouth daily with supper.   Yes Historical Provider, MD  rosuvastatin (CRESTOR) 5 MG tablet Take 1 tablet (5 mg total) by mouth daily. 09/07/16  Yes [name], MD  ESTRACE VAGINAL 0.1 MG/GM vaginal cream USE AS INSTRUCTED BY YOUR PRESCRIBER Patient not taking: Reported on 08/31/2016 08/09/16   Nori Riis, PA-C    Allergies Codeine; Sulfa antibiotics; and Tape  Family History  Problem Relation Age of Onset  . Other Father     "brain hemorrhage"  . Melanoma Daughter 58  . Alzheimer's disease Sister   . Alzheimer's disease Brother   . Alzheimer's disease Sister   . Kidney disease Neg Hx   . Bladder Cancer Neg Hx     Social History Social History  Substance Use Topics  . Smoking status: Former Smoker    Types: Cigarettes  . Smokeless tobacco: Never Used     Comment: quit 1990  . Alcohol use No    Review of Systems Constitutional: No fever/chills. Positive for fatigue Eyes: No visual changes. ENT: No sore throat. Cardiovascular: Denies chest pain. Respiratory: Denies shortness of breath. Gastrointestinal: No abdominal pain.  No nausea, no vomiting.  No diarrhea.  No constipation. Genitourinary: Negative for dysuria. Musculoskeletal: Negative for back pain. Skin: Negative for rash. Neurological: Negative for headaches, focal weakness or numbness.  10-point ROS otherwise  negative.  ____________________________________________   PHYSICAL EXAM:  VITAL SIGNS: ED Triage Vitals  Enc Vitals Group     BP      Pulse      Resp      Temp      Temp src      SpO2      Weight      Height      Head Circumference      Peak Flow      Pain Score      Pain Loc      Pain Edu?      Excl. in [place]?     Constitutional: Alert and oriented 4 very well-appearing nontoxic no diaphoresis speaks in full clear sentences Eyes: Conjunctivae are normal. PERRL. EOMI. Head: Atraumatic. Nose: No congestion/rhinnorhea. Mouth/Throat: Mucous membranes are moist.  Oropharynx non-erythematous. Neck: No stridor.   Cardiovascular: Irregularly irregular not tachycardic normal heart sounds. Able to lie  completely flat with no jugular venous distention Respiratory: Normal respiratory effort.  No retractions. Lungs CTAB. Gastrointestinal: Soft and nontender. No distention. No abdominal bruits. No CVA tenderness. Musculoskeletal: No lower extremity tenderness nor edema.  No joint effusions. Neurologic:  Normal speech and language. No gross focal neurologic deficits are appreciated.  Skin:  Skin is warm, dry and intact. No rash noted. Psychiatric: Mood and affect are normal. Speech and behavior are normal.  ____________________________________________   LABS (all labs ordered are listed, but only abnormal results are displayed)  Labs Reviewed  COMPREHENSIVE METABOLIC PANEL - Abnormal; Notable for the following:       Result Value   Potassium 2.6 (*)    Chloride 95 (*)    Glucose, Bld 153 (*)    BUN 24 (*)    Calcium 8.8 (*)    AST 49 (*)    GFR calc non Af Amer 48 (*)    GFR calc Af Amer 56 (*)    All other components within normal limits  BRAIN NATRIURETIC PEPTIDE - Abnormal; Notable for the following:    B Natriuretic Peptide 346.0 (*)    All other components within normal limits  CBC WITH DIFFERENTIAL/PLATELET - Abnormal; Notable for the following:    WBC 12.3 (*)     Hemoglobin 10.6 (*)    HCT 33.5 (*)    MCV 74.7 (*)    MCH 23.7 (*)    MCHC 31.7 (*)    RDW 17.8 (*)    Neutro Abs 9.2 (*)    All other components within normal limits  URINALYSIS, COMPLETE (UACMP) WITH MICROSCOPIC - Abnormal; Notable for the following:    Color, Urine YELLOW (*)    APPearance HAZY (*)    Squamous Epithelial / LPF 0-5 (*)    All other components within normal limits  CK - Abnormal; Notable for the following:    Total CK 32 (*)    All other components within normal limits  TROPONIN I  TSH  Significant hypokalemia ____________________________________________  EKG  ED ECG REPORT I, [name], the attending physician, personally viewed and interpreted this ECG.  Date: 10/30/2016 Rate: 104 Rhythm: Atrial fibrillation with rapid ventricular response QRS Axis: Rightward axis Intervals: Prolonged QTC ST/T Wave abnormalities: normal Conduction Disturbances: none Narrative Interpretation: Bifascicular block atrial fibrillation otherwise unremarkable EKG no change from previous  ____________________________________________  RADIOLOGY  No acute disease on chest x-ray ____________________________________________   PROCEDURES  Procedure(s) performed: no  Procedures  Critical Care performed: no  ____________________________________________   INITIAL IMPRESSION / ASSESSMENT AND PLAN / ED COURSE  Pertinent labs & imaging results that were available during my care of the patient were reviewed by me and considered in my medical decision making (see chart for details).      ----------------------------------------- 6:53 PM on 10/30/2016 -----------------------------------------  The patient's K is down to 2.6 which is likely secondary to Lasix. Overall she appears better and her heart rate has normalized however she is still unable to ambulate on her own. She is currently in independent living at [facility] and they will not accept her back if she  cannot ambulate on her own. I will touch base with him now regarding possible higher level of care at their facility.  I spoke with [facility] who indicated that they're able to arrange for the patient to go to the skilled nursing side of their facility tonight. She is euvolemic I'll hold her Lasix at home and increase her dose of  potassium for the next several days until she can follow up with her primary care physician on Monday. She is medically stable for outpatient management.  ____________________________________________   FINAL CLINICAL IMPRESSION(S) / ED DIAGNOSES  Final diagnoses:  Hypokalemia  Weakness      NEW MEDICATIONS STARTED DURING THIS VISIT:  New Prescriptions   No medications on file     Note:  This document was prepared using Dragon voice recognition software and may include unintentional dictation errors.     [name], MD 10/30/16 2013

## 2016-10-30 NOTE — ED Triage Notes (Signed)
Pt to ED from [facility] c/o weakness. Per EMS staff at [facility] reported pt was hypotensive today, at at the scene BP 102/60. Staff also reports pt has been c/o weakness for the past week. Pt A&Ox4 denies pain and NVD, in no acute distress at this time.

## 2016-10-30 NOTE — Discharge Instructions (Signed)
Please increase the patient's K-Dur to 20 mEq daily and hold her Lasix until she can see her primary care physician for reevaluation. Return to the emergency department for any new or worsening symptoms such as fevers, chills, chest pain, shortness of breath, or for any other concerns.

## 2016-10-30 NOTE — ED Notes (Signed)
Pt cleansed of liquid stool and urine. Fresh brief placed.

## 2016-10-30 NOTE — ED Notes (Signed)
Pt critical potassium, MD made aware.

## 2016-10-31 ENCOUNTER — Encounter: Payer: Self-pay | Admitting: Emergency Medicine

## 2016-10-31 ENCOUNTER — Emergency Department: Payer: Medicare Other

## 2016-10-31 ENCOUNTER — Emergency Department
Admission: EM | Admit: 2016-10-31 | Discharge: 2016-10-31 | Disposition: A | Discharge status: Home / Self Care | Payer: Medicare Other | Attending: Emergency Medicine | Admitting: Emergency Medicine

## 2016-10-31 DIAGNOSIS — Z87891 Personal history of nicotine dependence: Secondary | ICD-10-CM | POA: Diagnosis not present

## 2016-10-31 DIAGNOSIS — Z856 Personal history of leukemia: Secondary | ICD-10-CM | POA: Diagnosis not present

## 2016-10-31 DIAGNOSIS — R9082 White matter disease, unspecified: Secondary | ICD-10-CM | POA: Diagnosis not present

## 2016-10-31 DIAGNOSIS — E876 Hypokalemia: Secondary | ICD-10-CM | POA: Diagnosis not present

## 2016-10-31 DIAGNOSIS — I10 Essential (primary) hypertension: Secondary | ICD-10-CM | POA: Insufficient documentation

## 2016-10-31 DIAGNOSIS — R6889 Other general symptoms and signs: Secondary | ICD-10-CM | POA: Diagnosis not present

## 2016-10-31 DIAGNOSIS — R0602 Shortness of breath: Secondary | ICD-10-CM | POA: Insufficient documentation

## 2016-10-31 DIAGNOSIS — Z7401 Bed confinement status: Secondary | ICD-10-CM | POA: Diagnosis not present

## 2016-10-31 DIAGNOSIS — Z79899 Other long term (current) drug therapy: Secondary | ICD-10-CM | POA: Insufficient documentation

## 2016-10-31 DIAGNOSIS — R531 Weakness: Secondary | ICD-10-CM

## 2016-10-31 DIAGNOSIS — R0600 Dyspnea, unspecified: Secondary | ICD-10-CM | POA: Diagnosis not present

## 2016-10-31 LAB — URINALYSIS, COMPLETE (UACMP) WITH MICROSCOPIC
BILIRUBIN URINE: NEGATIVE
GLUCOSE, UA: NEGATIVE mg/dL
Ketones, ur: NEGATIVE mg/dL
NITRITE: NEGATIVE
PH: 6 (ref 5.0–8.0)
Protein, ur: NEGATIVE mg/dL
Specific Gravity, Urine: 1.011 (ref 1.005–1.030)

## 2016-10-31 LAB — CBC
HCT: 31.6 % — ABNORMAL LOW (ref 35.0–47.0)
Hemoglobin: 10 g/dL — ABNORMAL LOW (ref 12.0–16.0)
MCH: 23.8 pg — ABNORMAL LOW (ref 26.0–34.0)
MCHC: 31.6 g/dL — ABNORMAL LOW (ref 32.0–36.0)
MCV: 75.2 fL — ABNORMAL LOW (ref 80.0–100.0)
PLATELETS: 272 10*3/uL (ref 150–440)
RBC: 4.2 MIL/uL (ref 3.80–5.20)
RDW: 17.5 % — AB (ref 11.5–14.5)
WBC: 13 10*3/uL — AB (ref 3.6–11.0)

## 2016-10-31 LAB — TROPONIN I: Troponin I: 0.04 ng/mL (ref <0.03)

## 2016-10-31 LAB — BASIC METABOLIC PANEL
Anion gap: 8 (ref 5–15)
BUN: 18 mg/dL (ref 6–20)
CO2: 30 mmol/L (ref 22–32)
CREATININE: 0.9 mg/dL (ref 0.44–1.00)
Calcium: 8.7 mg/dL — ABNORMAL LOW (ref 8.9–10.3)
Chloride: 99 mmol/L — ABNORMAL LOW (ref 101–111)
GFR, EST NON AFRICAN AMERICAN: 55 mL/min — AB (ref >60)
Glucose, Bld: 106 mg/dL — ABNORMAL HIGH (ref 65–99)
Potassium: 3 mmol/L — ABNORMAL LOW (ref 3.5–5.1)
SODIUM: 137 mmol/L (ref 135–145)

## 2016-10-31 LAB — BRAIN NATRIURETIC PEPTIDE: B Natriuretic Peptide: 288 pg/mL — ABNORMAL HIGH (ref 0.0–100.0)

## 2016-10-31 MED ORDER — POTASSIUM CHLORIDE CRYS ER 20 MEQ PO TBCR
40.0000 meq | EXTENDED_RELEASE_TABLET | Freq: Once | ORAL | Status: AC
  Administered 2016-10-31: 40 meq via ORAL
  Filled 2016-10-31: qty 2

## 2016-10-31 NOTE — ED Notes (Signed)
Family at bedside and given update on patient's care. MD notified of family being present at this time.

## 2016-10-31 NOTE — ED Triage Notes (Signed)
Pt comes into the ED via EMS from [facility] c/o generalized weakness that has been going on for a while.  The facility sent her over here for "fluid on the lungs".  Patient was seen here yesterday for shortness of breath where it was determined she had low potassium and a clear chest x-ray.  Patient unsure why she was sent over here due to the weakness being an ongoing situation.  Patient alert and oriented x4, denies shortness of breath, chest pain, and has even and unlabored respirations.

## 2016-10-31 NOTE — ED Notes (Signed)
ED Provider at bedside. 

## 2016-10-31 NOTE — ED Provider Notes (Signed)
[facility] Emergency Department Provider Note  ____________________________________________   First MD Initiated Contact with Patient 10/31/16 1233     (approximate)  I have reviewed the triage vital signs and the nursing notes.   HISTORY  Chief Complaint Weakness   HPI Victoria Contreras is a 83 y.o. female with a history of atrial fibrillation and CLL who is presenting to the emergency department today for generalized weakness. She is denying any pain at this time. She does not say that she has any worsening weakness or any symptoms today relative to her normal. However, when the nursing home was contacted there reported that she had worsening weakness over the course the week and was not able to get out of bed this morning. The patient denies any shortness of breath or chest pain. She denies any burning with urination.  She was just seen in the emergency department here yesterday. Because of a low potassium she was taken off her Lasix as well as increased on her daily dose of potassium. EMS reported that she was here for worsening swelling in her lower extremities. However, the patient is a complaint of swelling in her lower extremity is.   Past Medical History:  Diagnosis Date  . Atrial fibrillation ([place])   . Cataracts, both eyes   . CLL (chronic lymphocytic leukemia) ([place])   . CLL (chronic lymphoid leukemia) in relapse ([place]) 04/17/2015  . CVA (cerebral infarction)   . Frequent nosebleeds   . Gout   . Heart attack   . Heart burn   . Hemangiopericytoma   . HLD (hyperlipidemia)   . HTN (hypertension)   . UTI (lower urinary tract infection)     Patient Active Problem List   Diagnosis Date Noted  . Atrial fibrillation with RVR ([place]) 09/01/2016  . Atrial fibrillation with rapid ventricular response ([place]) 08/31/2016  . Dysuria 11/06/2015  . Cystocele, grade 2 11/06/2015  . Atrophic vaginitis 11/06/2015  . CLL (chronic lymphoid leukemia) in relapse  ([place]) 04/17/2015  . 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 APPENDECTOMY    . BREAST LUMPECTOMY    . CATARACT EXTRACTION BILATERAL W/ ANTERIOR VITRECTOMY     right/left eye  . CHOLECYSTECTOMY    . DILATION AND CURETTAGE OF UTERUS    . hemangiopericytoma resection    . TONSILLECTOMY    . TOTAL KNEE ARTHROPLASTY     right knee    Prior to Admission medications   Medication Sig Start Date End Date Taking? Authorizing Provider  acetaminophen (TYLENOL) 500 MG tablet Take 500 mg by mouth every 4 (four) hours as needed for mild pain or fever.   Yes Historical Provider, MD  allopurinol (ZYLOPRIM) 300 MG tablet Take 300 mg by mouth daily.   Yes Historical Provider, MD  alum & mag hydroxide-simeth (MAALOX/MYLANTA) 200-200-20 MG/5ML suspension Take 30 mLs by mouth every 4 (four) hours as needed for indigestion or heartburn.   Yes Historical Provider, MD  amiodarone (PACERONE) 400 MG tablet Take 1 tablet (400 mg total) by mouth 2 (two) times daily. 09/07/16  Yes [name], MD  bismuth subsalicylate (KAOPECTATE) 262 MG/15ML suspension Take 10 mLs by mouth 3 (three) times daily as needed.   Yes Historical Provider, MD  carbamide peroxide (DEBROX) 6.5 % otic solution Place 5 drops into both ears as needed.   Yes Historical Provider, MD  diltiazem (CARDIZEM CD) 300 MG 24 hr capsule Take 1 capsule (300 mg total)  by mouth daily. 09/08/16  Yes [name], MD  diphenhydrAMINE (BENADRYL) 25 MG tablet Take 25 mg by mouth every 4 (four) hours as needed.   Yes Historical Provider, MD  furosemide (LASIX) 20 MG tablet Take 20 mg by mouth daily.   Yes Historical Provider, MD  guaifenesin (ROBITUSSIN) 100 MG/5ML syrup Take 200 mg by mouth every 4 (four) hours as needed for cough.   Yes Historical Provider, MD  hydrochlorothiazide (HYDRODIURIL) 25 MG tablet Take 25 mg by mouth every morning.   Yes Historical Provider, MD  hydrocortisone cream 0.5 % Apply  topically 4 (four) times daily. 09/07/16  Yes [name], MD  magnesium hydroxide (MILK OF MAGNESIA) 400 MG/5ML suspension Take 30 mLs by mouth daily as needed for mild constipation.   Yes Historical Provider, MD  metoprolol succinate (TOPROL-XL) 25 MG 24 hr tablet Take 25 mg by mouth daily.   Yes Historical Provider, MD  naproxen sodium (ANAPROX) 220 MG tablet Take 440 mg by mouth 2 (two) times daily with a meal. Reported on 11/05/2015   Yes Historical Provider, MD  nystatin (MYCOSTATIN/NYSTOP) powder Apply topically as needed.   Yes Historical Provider, MD  potassium chloride (K-DUR) 10 MEQ tablet Take 2 tablets (20 mEq total) by mouth daily. 10/30/16  Yes [name], MD  rivaroxaban (XARELTO) 20 MG TABS tablet Take 20 mg by mouth daily with supper.   Yes Historical Provider, MD  rosuvastatin (CRESTOR) 5 MG tablet Take 1 tablet (5 mg total) by mouth daily. 09/07/16  Yes [name], MD  ESTRACE VAGINAL 0.1 MG/GM vaginal cream USE AS INSTRUCTED BY YOUR PRESCRIBER Patient not taking: Reported on 08/31/2016 08/09/16   Larene Beach A McGowan, PA-C  oseltamivir (TAMIFLU) 75 MG capsule Take 75 mg by mouth daily. 10/27/16 11/05/16  Historical Provider, MD    Allergies Codeine; Sulfa antibiotics; and Tape  Family History  Problem Relation Age of Onset  . Other Father     "brain hemorrhage"  . Melanoma Daughter 90  . Alzheimer's disease Sister   . Alzheimer's disease Brother   . Alzheimer's disease Sister   . Kidney disease Neg Hx   . Bladder Cancer Neg Hx     Social History Social History  Substance Use Topics  . Smoking status: Former Smoker    Types: Cigarettes  . Smokeless tobacco: Never Used     Comment: quit 1990  . Alcohol use No    Review of Systems Constitutional: No fever/chills Eyes: No visual changes. ENT: No sore throat. Cardiovascular: Denies chest pain. Respiratory: Denies shortness of breath. Gastrointestinal: No abdominal pain.  No nausea, no vomiting.  No  diarrhea.  No constipation. Genitourinary: Negative for dysuria. Musculoskeletal: Negative for back pain. Skin: Negative for rash. Neurological: Negative for headaches, focal weakness or numbness.  10-point ROS otherwise negative.  ____________________________________________   PHYSICAL EXAM:  VITAL SIGNS: ED Triage Vitals  Enc Vitals Group     BP 10/31/16 1222 108/74     Pulse Rate 10/31/16 1222 95     Resp 10/31/16 1222 18     Temp 10/31/16 1222 98.7 F (37.1 C)     Temp Source 10/31/16 1222 Oral     SpO2 10/31/16 1222 93 %     Weight 10/31/16 1222 210 lb (95.3 kg)     Height 10/31/16 1222 5\' 10"  (1.778 m)     Head Circumference --      Peak Flow --      Pain Score 10/31/16 1225 0  Pain Loc --      Pain Edu? --      Excl. in [place]? --     Constitutional: Alert and oriented. Well appearing and in no acute distress. Eyes: Conjunctivae are normal. PERRL. EOMI. Head: Atraumatic. Nose: No congestion/rhinnorhea. Mouth/Throat: Mucous membranes are moist.   Neck: No stridor.   Cardiovascular: Cardiac with an irregularly irregular rhythm. Grossly normal heart sounds.  Good peripheral circulation. Her, the heart rate is between the 90s and the low 100s on the monitor is consistent with atrophic relation. Respiratory: Normal respiratory effort.  No retractions. Lungs CTAB. Gastrointestinal: Soft and nontender. No distention.  Musculoskeletal: No lower extremity tenderness.  minimal bilateral lower extremity edema. No joint effusions. Neurologic:  Normal speech and language. No gross focal neurologic deficits are appreciated.  Skin:  Skin is warm, dry and intact. No rash noted. Psychiatric: Mood and affect are normal. Speech and behavior are normal.  ____________________________________________   LABS (all labs ordered are listed, but only abnormal results are displayed)  Labs Reviewed  BASIC METABOLIC PANEL - Abnormal; Notable for the following:       Result Value    Potassium 3.0 (*)    Chloride 99 (*)    Glucose, Bld 106 (*)    Calcium 8.7 (*)    GFR calc non Af Amer 55 (*)    All other components within normal limits  CBC - Abnormal; Notable for the following:    WBC 13.0 (*)    Hemoglobin 10.0 (*)    HCT 31.6 (*)    MCV 75.2 (*)    MCH 23.8 (*)    MCHC 31.6 (*)    RDW 17.5 (*)    All other components within normal limits  TROPONIN I - Abnormal; Notable for the following:    Troponin I 0.04 (*)    All other components within normal limits  URINALYSIS, COMPLETE (UACMP) WITH MICROSCOPIC  TROPONIN I  BRAIN NATRIURETIC PEPTIDE  CBG MONITORING, ED   ____________________________________________  EKG  ED ECG REPORT I, Doran Stabler, the attending physician, personally viewed and interpreted this ECG.   Date: 10/31/2016  EKG Time: 1221  Rate: 123  Rhythm: atrial fibrillation, rate 123  Axis: normal  Intervals:right bundle branch block  ST&T Change: No ST segment elevation or depression. T-wave inversions in V2 as well as V3. Nose no gait change from yesterday's EKG. ____________________________________________  [facility] Chest [place] 1 View (Accession [id number]) (Order [id number])  Imaging  Date: 10/30/2016 Department: [facility] EMERGENCY DEPARTMENT Released By/Authorizing: [name], MD (auto-released)  Exam Information   Status Exam Begun  Exam Ended   Final [99] 10/30/2016 3:45 PM 10/30/2016 3:55 PM  PACS Images   Show images for DG Chest Port 1 View  Study Result   CLINICAL DATA:  Weakness today.  EXAM: PORTABLE CHEST 1 VIEW  COMPARISON:  CT chest 09/02/2016. Single-view of the chest 08/31/2016.  FINDINGS: There is cardiomegaly without edema. No pneumothorax or pleural effusion. Aortic atherosclerosis noted. No acute bony abnormality.  IMPRESSION: Cardiomegaly without acute disease.  Atherosclerosis.   Electronically Signed   By: Inge Rise M.D.   On:  10/30/2016 16:41    Above EKG taken yesterday. ____________________________________________   PROCEDURES  Procedure(s) performed:   Procedures  Critical Care performed:   ____________________________________________   INITIAL IMPRESSION / ASSESSMENT AND PLAN / ED COURSE  Pertinent labs & imaging results that were available during my care of the patient were  reviewed by me and considered in my medical decision making (see chart for details).  ----------------------------------------- 3:23 PM on 10/31/2016 -----------------------------------------  Awaiting second troponin at this time. Mildly elevated initial troponin. Patient's workup otherwise this time appears reassuring without acute findings. Signed out to Dr. Mable Paris.       ____________________________________________   FINAL CLINICAL IMPRESSION(S) / ED DIAGNOSES  Weakness    NEW MEDICATIONS STARTED DURING THIS VISIT:  New Prescriptions   No medications on file     Note:  This document was prepared using Dragon voice recognition software and may include unintentional dictation errors.    Orbie Pyo, MD 10/31/16 [phone]

## 2016-10-31 NOTE — Discharge Instructions (Signed)
Fortunately today you are chest x-ray and head CT are unremarkable and her potassium is 3.0 which is up from 2.6 yesterday. Please continue taking the increased dose of potassium that we discussed yesterday and hold her Lasix until you can be evaluated by Dr. Ubaldo Glassing.  Return to the emergency department for any new or worsening symptoms whatsoever.

## 2016-10-31 NOTE — ED Notes (Signed)
Lamar to get more clarification on the reason for the patient being here.  Spoke with Ivin Booty who explained that the patient who is normally ambulatory has had increased weakness for the past week and could not stand up on her own today.  She explained they have been having a harder time getting her up this week.

## 2016-11-03 DIAGNOSIS — M6281 Muscle weakness (generalized): Secondary | ICD-10-CM | POA: Diagnosis not present

## 2016-11-03 DIAGNOSIS — R2681 Unsteadiness on feet: Secondary | ICD-10-CM | POA: Diagnosis not present

## 2016-11-03 DIAGNOSIS — R2689 Other abnormalities of gait and mobility: Secondary | ICD-10-CM | POA: Diagnosis not present

## 2016-11-04 DIAGNOSIS — I5032 Chronic diastolic (congestive) heart failure: Secondary | ICD-10-CM | POA: Diagnosis not present

## 2016-11-04 DIAGNOSIS — I480 Paroxysmal atrial fibrillation: Secondary | ICD-10-CM | POA: Diagnosis not present

## 2016-11-04 DIAGNOSIS — M6281 Muscle weakness (generalized): Secondary | ICD-10-CM | POA: Diagnosis not present

## 2016-11-04 DIAGNOSIS — I10 Essential (primary) hypertension: Secondary | ICD-10-CM | POA: Diagnosis not present

## 2016-11-04 DIAGNOSIS — E7801 Familial hypercholesterolemia: Secondary | ICD-10-CM | POA: Diagnosis not present

## 2016-11-04 DIAGNOSIS — R2689 Other abnormalities of gait and mobility: Secondary | ICD-10-CM | POA: Diagnosis not present

## 2016-11-04 DIAGNOSIS — R2681 Unsteadiness on feet: Secondary | ICD-10-CM | POA: Diagnosis not present

## 2016-11-04 DIAGNOSIS — C9110 Chronic lymphocytic leukemia of B-cell type not having achieved remission: Secondary | ICD-10-CM | POA: Diagnosis not present

## 2016-11-04 DIAGNOSIS — I481 Persistent atrial fibrillation: Secondary | ICD-10-CM | POA: Diagnosis not present

## 2016-11-05 DIAGNOSIS — R2689 Other abnormalities of gait and mobility: Secondary | ICD-10-CM | POA: Diagnosis not present

## 2016-11-05 DIAGNOSIS — R2681 Unsteadiness on feet: Secondary | ICD-10-CM | POA: Diagnosis not present

## 2016-11-05 DIAGNOSIS — M6281 Muscle weakness (generalized): Secondary | ICD-10-CM | POA: Diagnosis not present

## 2016-11-08 DIAGNOSIS — R2681 Unsteadiness on feet: Secondary | ICD-10-CM | POA: Diagnosis not present

## 2016-11-08 DIAGNOSIS — M6281 Muscle weakness (generalized): Secondary | ICD-10-CM | POA: Diagnosis not present

## 2016-11-08 DIAGNOSIS — R2689 Other abnormalities of gait and mobility: Secondary | ICD-10-CM | POA: Diagnosis not present

## 2016-11-09 DIAGNOSIS — D510 Vitamin B12 deficiency anemia due to intrinsic factor deficiency: Secondary | ICD-10-CM | POA: Diagnosis not present

## 2016-11-09 DIAGNOSIS — D539 Nutritional anemia, unspecified: Secondary | ICD-10-CM | POA: Diagnosis not present

## 2016-11-09 DIAGNOSIS — R2681 Unsteadiness on feet: Secondary | ICD-10-CM | POA: Diagnosis not present

## 2016-11-09 DIAGNOSIS — E785 Hyperlipidemia, unspecified: Secondary | ICD-10-CM | POA: Diagnosis not present

## 2016-11-09 DIAGNOSIS — R2689 Other abnormalities of gait and mobility: Secondary | ICD-10-CM | POA: Diagnosis not present

## 2016-11-09 DIAGNOSIS — M6281 Muscle weakness (generalized): Secondary | ICD-10-CM | POA: Diagnosis not present

## 2016-11-10 DIAGNOSIS — M6281 Muscle weakness (generalized): Secondary | ICD-10-CM | POA: Diagnosis not present

## 2016-11-10 DIAGNOSIS — R2681 Unsteadiness on feet: Secondary | ICD-10-CM | POA: Diagnosis not present

## 2016-11-10 DIAGNOSIS — R2689 Other abnormalities of gait and mobility: Secondary | ICD-10-CM | POA: Diagnosis not present

## 2016-11-11 DIAGNOSIS — R2681 Unsteadiness on feet: Secondary | ICD-10-CM | POA: Diagnosis not present

## 2016-11-11 DIAGNOSIS — R2689 Other abnormalities of gait and mobility: Secondary | ICD-10-CM | POA: Diagnosis not present

## 2016-11-11 DIAGNOSIS — M6281 Muscle weakness (generalized): Secondary | ICD-10-CM | POA: Diagnosis not present

## 2016-11-12 DIAGNOSIS — R2689 Other abnormalities of gait and mobility: Secondary | ICD-10-CM | POA: Diagnosis not present

## 2016-11-12 DIAGNOSIS — M6281 Muscle weakness (generalized): Secondary | ICD-10-CM | POA: Diagnosis not present

## 2016-11-12 DIAGNOSIS — R2681 Unsteadiness on feet: Secondary | ICD-10-CM | POA: Diagnosis not present

## 2016-11-13 DIAGNOSIS — R2681 Unsteadiness on feet: Secondary | ICD-10-CM | POA: Diagnosis not present

## 2016-11-13 DIAGNOSIS — R2689 Other abnormalities of gait and mobility: Secondary | ICD-10-CM | POA: Diagnosis not present

## 2016-11-13 DIAGNOSIS — M6281 Muscle weakness (generalized): Secondary | ICD-10-CM | POA: Diagnosis not present

## 2016-11-15 DIAGNOSIS — R2689 Other abnormalities of gait and mobility: Secondary | ICD-10-CM | POA: Diagnosis not present

## 2016-11-15 DIAGNOSIS — R2681 Unsteadiness on feet: Secondary | ICD-10-CM | POA: Diagnosis not present

## 2016-11-15 DIAGNOSIS — M6281 Muscle weakness (generalized): Secondary | ICD-10-CM | POA: Diagnosis not present

## 2016-11-16 DIAGNOSIS — R2689 Other abnormalities of gait and mobility: Secondary | ICD-10-CM | POA: Diagnosis not present

## 2016-11-16 DIAGNOSIS — R2681 Unsteadiness on feet: Secondary | ICD-10-CM | POA: Diagnosis not present

## 2016-11-16 DIAGNOSIS — F39 Unspecified mood [affective] disorder: Secondary | ICD-10-CM | POA: Diagnosis not present

## 2016-11-16 DIAGNOSIS — M6281 Muscle weakness (generalized): Secondary | ICD-10-CM | POA: Diagnosis not present

## 2016-11-16 DIAGNOSIS — I5032 Chronic diastolic (congestive) heart failure: Secondary | ICD-10-CM | POA: Diagnosis not present

## 2016-11-22 ENCOUNTER — Encounter: Payer: Self-pay | Admitting: Internal Medicine

## 2016-11-22 ENCOUNTER — Telehealth: Payer: Self-pay

## 2016-11-22 DIAGNOSIS — I5032 Chronic diastolic (congestive) heart failure: Secondary | ICD-10-CM | POA: Insufficient documentation

## 2016-11-22 DIAGNOSIS — F39 Unspecified mood [affective] disorder: Secondary | ICD-10-CM | POA: Insufficient documentation

## 2016-11-22 NOTE — Telephone Encounter (Signed)
Completed med reconciliation

## 2016-11-25 ENCOUNTER — Non-Acute Institutional Stay: Payer: Medicare Other | Admitting: Internal Medicine

## 2016-11-25 ENCOUNTER — Encounter: Payer: Self-pay | Admitting: Internal Medicine

## 2016-11-25 VITALS — BP 110/70 | HR 105 | Resp 16 | Wt 198.0 lb

## 2016-11-25 DIAGNOSIS — I4891 Unspecified atrial fibrillation: Secondary | ICD-10-CM | POA: Diagnosis not present

## 2016-11-25 DIAGNOSIS — C91Z2 Other lymphoid leukemia, in relapse: Secondary | ICD-10-CM

## 2016-11-25 DIAGNOSIS — C9192 Lymphoid leukemia, unspecified, in relapse: Secondary | ICD-10-CM

## 2016-11-25 DIAGNOSIS — I5032 Chronic diastolic (congestive) heart failure: Secondary | ICD-10-CM

## 2016-11-25 DIAGNOSIS — F39 Unspecified mood [affective] disorder: Secondary | ICD-10-CM | POA: Diagnosis not present

## 2016-11-25 DIAGNOSIS — C9112 Chronic lymphocytic leukemia of B-cell type in relapse: Secondary | ICD-10-CM

## 2016-11-25 NOTE — Assessment & Plan Note (Addendum)
Compensated now Mild edema is stable Uses wheelchair to get around--but back to functioning okay in [place]

## 2016-11-25 NOTE — Assessment & Plan Note (Signed)
Doing well on the citalopram No plans to decrease or stop ---does better with it

## 2016-11-25 NOTE — Assessment & Plan Note (Signed)
Still mildly elevated Nausea with metoprolol so reluctant to increase Seeing Dr Ubaldo Glassing next week

## 2016-11-25 NOTE — Progress Notes (Signed)
Subjective:    Patient ID: Victoria Contreras, female    DOB: November 28, 1924, 83 y.o.   MRN: [id number]  HPI Initial visit for her in assisted living. I had seen her in rehab here at [facility]. Reviewed her status with Luellen Pucker RN  Has gotten back into her routine here Chronic edema--keeps them elevated Daily weights fairly stable No SOB Sleeps propped in bed--- no PND No chest pain No palpitations Still gets nausea from the metoprolol--will discuss with Dr Ubaldo Glassing at upcoming appt  Mood has been okay No persistent depressed mood No sig anxiety  No fevers No fatigue No easy bruising  Current Outpatient Prescriptions on File Prior to Visit  Medication Sig Dispense Refill  . allopurinol (ZYLOPRIM) 300 MG tablet Take 300 mg by mouth daily.    Marland Kitchen amiodarone (PACERONE) 400 MG tablet Take 1 tablet (400 mg total) by mouth 2 (two) times daily. (Patient taking differently: Take 400 mg by mouth daily. ) 60 tablet 0  . diltiazem (CARDIZEM CD) 300 MG 24 hr capsule Take 1 capsule (300 mg total) by mouth daily. 30 capsule 0  . furosemide (LASIX) 20 MG tablet Take 20 mg by mouth daily. Take 20mg  daily if weight is > 190lb    . metoprolol succinate (TOPROL-XL) 25 MG 24 hr tablet Take 25 mg by mouth daily.    . ondansetron (ZOFRAN) 4 MG tablet Take 4 mg by mouth 3 (three) times daily as needed for nausea or vomiting.    . potassium chloride (K-DUR) 10 MEQ tablet Take 2 tablets (20 mEq total) by mouth daily. 90 tablet 0  . rivaroxaban (XARELTO) 20 MG TABS tablet Take 20 mg by mouth daily with supper.    . rosuvastatin (CRESTOR) 5 MG tablet Take 1 tablet (5 mg total) by mouth daily. 30 tablet 0   No current facility-administered medications on file prior to visit.     Allergies  Allergen Reactions  . Codeine     Other reaction(s): Unknown  . Sulfa Antibiotics Other (See Comments)    Deathly sick   . Tape Itching    Other reaction(s): Itching of Skin    Past Medical History:  Diagnosis Date  .  Atrial fibrillation ([place])   . Chronic diastolic heart failure ([place])   . CLL (chronic lymphoid leukemia) in relapse ([place]) 04/17/2015  . CVA (cerebral infarction)   . Frequent nosebleeds   . Gout   . Hemangiopericytoma   . HLD (hyperlipidemia)   . HTN (hypertension)   . Mood disorder [facility])     Past Surgical History:  Procedure Laterality Date  . APPENDECTOMY    . BREAST LUMPECTOMY    . CATARACT EXTRACTION BILATERAL W/ ANTERIOR VITRECTOMY     right/left eye  . CHOLECYSTECTOMY    . DILATION AND CURETTAGE OF UTERUS    . hemangiopericytoma resection    . TONSILLECTOMY    . TOTAL KNEE ARTHROPLASTY     right knee    Family History  Problem Relation Age of Onset  . Other Father     "brain hemorrhage"  . Melanoma Daughter 35  . Alcoholism Sister   . Alzheimer's disease Brother   . Alzheimer's disease Sister   . Cancer Sister   . Kidney disease Neg Hx   . Bladder Cancer Neg Hx     Social History   Social History  . Marital status: Widowed    Spouse name: N/A  . Number of children: 5  . Years of  education: N/A   Occupational History  . Secretary     Retired   Social History Main Topics  . Smoking status: Former Smoker    Types: Cigarettes  . Smokeless tobacco: Never Used     Comment: quit 1990  . Alcohol use No  . Drug use: No  . Sexual activity: Not on file   Other Topics Concern  . Not on file   Social History Narrative   Widowed 12/16/03. 5 children-- 1 son died   97 daughters/1 son living      Has living will   Son Joe/daughter Trish--health care POAs   Would accept resuscitation.   No tube feeds if cognitively unaware   Review of Systems Appetite is good Sleeps well--sometimes in naps in chair in day Bowels are okay No skin problems    Objective:   Physical Exam  Constitutional: She appears well-nourished. No distress.  Neck: No thyromegaly present.  Cardiovascular: Normal heart sounds and intact distal pulses.  Exam reveals no gallop.   No murmur  heard. Irregular and slightly elevated rate  Pulmonary/Chest: Effort normal and breath sounds normal. No respiratory distress. She has no wheezes. She has no rales.  Abdominal: Soft. There is no tenderness.  Musculoskeletal:  1+ pitting edema  Lymphadenopathy:    She has no cervical adenopathy.  Skin: No rash noted.  No foot ulcers  Psychiatric: She has a normal mood and affect. Her behavior is normal.          Assessment & Plan:

## 2016-11-25 NOTE — Assessment & Plan Note (Signed)
White count only mildly elevated so no Rx planned

## 2016-12-01 ENCOUNTER — Encounter: Payer: Self-pay | Admitting: Internal Medicine

## 2016-12-01 ENCOUNTER — Non-Acute Institutional Stay: Payer: Medicare Other | Admitting: Internal Medicine

## 2016-12-01 VITALS — BP 124/76

## 2016-12-01 DIAGNOSIS — B351 Tinea unguium: Secondary | ICD-10-CM | POA: Diagnosis not present

## 2016-12-01 DIAGNOSIS — R609 Edema, unspecified: Secondary | ICD-10-CM | POA: Diagnosis not present

## 2016-12-01 DIAGNOSIS — I481 Persistent atrial fibrillation: Secondary | ICD-10-CM | POA: Diagnosis not present

## 2016-12-01 DIAGNOSIS — E7801 Familial hypercholesterolemia: Secondary | ICD-10-CM | POA: Diagnosis not present

## 2016-12-01 DIAGNOSIS — I10 Essential (primary) hypertension: Secondary | ICD-10-CM | POA: Diagnosis not present

## 2016-12-01 NOTE — Progress Notes (Signed)
Subjective:    Patient ID: Victoria Contreras, female    DOB: [birth year], 83 y.o.   MRN: [id number]  HPI  Asked to see resident in [address] Requesting toenails trimmed Thick, long, getting caught on socks and shoes causing pain Has some peripheral edema which is complicating her long toenails, as they are digging into her toes Getting Lasix prn.  Review of Systems      Past Medical History:  Diagnosis Date  . Atrial fibrillation ([place])   . Chronic diastolic heart failure ([place])   . CLL (chronic lymphoid leukemia) in relapse ([place]) 04/17/2015  . CVA (cerebral infarction)   . Frequent nosebleeds   . Gout   . Hemangiopericytoma   . HLD (hyperlipidemia)   . HTN (hypertension)   . Mood disorder [facility])     Current Outpatient Prescriptions  Medication Sig Dispense Refill  . allopurinol (ZYLOPRIM) 300 MG tablet Take 300 mg by mouth daily.    Marland Kitchen amiodarone (PACERONE) 400 MG tablet Take 1 tablet (400 mg total) by mouth 2 (two) times daily. (Patient taking differently: Take 400 mg by mouth daily. ) 60 tablet 0  . citalopram (CELEXA) 20 MG tablet Take 20 mg by mouth daily.    Marland Kitchen diltiazem (CARDIZEM CD) 300 MG 24 hr capsule Take 1 capsule (300 mg total) by mouth daily. 30 capsule 0  . furosemide (LASIX) 20 MG tablet Take 20 mg by mouth daily. Take 20mg  daily if weight is > 190lb    . metoprolol succinate (TOPROL-XL) 25 MG 24 hr tablet Take 25 mg by mouth daily.    . ondansetron (ZOFRAN) 4 MG tablet Take 4 mg by mouth 3 (three) times daily as needed for nausea or vomiting.    . potassium chloride (K-DUR) 10 MEQ tablet Take 2 tablets (20 mEq total) by mouth daily. 90 tablet 0  . rivaroxaban (XARELTO) 20 MG TABS tablet Take 20 mg by mouth daily with supper.    . rosuvastatin (CRESTOR) 5 MG tablet Take 1 tablet (5 mg total) by mouth daily. 30 tablet 0   No current facility-administered medications for this visit.     Allergies  Allergen Reactions  . Codeine     Other reaction(s): Unknown  .  Sulfa Antibiotics Other (See Comments)    Deathly sick   . Tape Itching    Other reaction(s): Itching of Skin    Family History  Problem Relation Age of Onset  . Other Father     "brain hemorrhage"  . Melanoma Daughter 70  . Alcoholism Sister   . Alzheimer's disease Brother   . Alzheimer's disease Sister   . Cancer Sister   . Kidney disease Neg Hx   . Bladder Cancer Neg Hx     Social History   Social History  . Marital status: Widowed    Spouse name: N/A  . Number of children: 5  . Years of education: N/A   Occupational History  . Secretary     Retired   Social History Main Topics  . Smoking status: Former Smoker    Types: Cigarettes  . Smokeless tobacco: Never Used     Comment: quit 1990  . Alcohol use No  . Drug use: No  . Sexual activity: Not on file   Other Topics Concern  . Not on file   Social History Narrative   Widowed 12-18-03. 5 children-- 1 son died   7 daughters/1 son living      Has living will  Son Joe/daughter Trish--health care POAs   Would accept resuscitation.   No tube feeds if cognitively unaware     Skin: Pt reports think, long, discolored toenails. Denies redness, rashes, lesions or ulcercations.   No other specific complaints in a complete review of systems (except as listed in HPI above).  Objective:   Physical Exam   BP 124/76   Wt Readings from Last 3 Encounters:  11/25/16 198 lb (89.8 kg)  10/31/16 210 lb (95.3 kg)  10/30/16 210 lb (95.3 kg)    General: Appears her stated age, in NAD. Skin: Thick, mycotic toenails bilaterally. Cardiovascular: 1+ pitting BLE edema noted.   BMET    Component Value Date/Time   NA 137 10/31/2016 1227   NA 141 12/29/2013 0437   K 3.0 (L) 10/31/2016 1227   K 3.7 12/29/2013 0437   CL 99 (L) 10/31/2016 1227   CL 109 (H) 12/29/2013 0437   CO2 30 10/31/2016 1227   CO2 27 12/29/2013 0437   GLUCOSE 106 (H) 10/31/2016 1227   GLUCOSE 110 (H) 12/29/2013 0437   BUN 18 10/31/2016 1227    BUN 12 12/29/2013 0437   CREATININE 0.90 10/31/2016 1227   CREATININE 0.84 10/11/2014 1117   CALCIUM 8.7 (L) 10/31/2016 1227   CALCIUM 9.0 12/29/2013 0437   GFRNONAA 55 (L) 10/31/2016 1227   GFRNONAA >60 10/11/2014 1117   GFRNONAA >60 12/29/2013 0437   GFRAA >60 10/31/2016 1227   GFRAA >60 10/11/2014 1117   GFRAA >60 12/29/2013 0437    Lipid Panel     Component Value Date/Time   CHOL 90 09/01/2016 0807   CHOL 104 12/29/2013 0437   TRIG 89 09/01/2016 0807   TRIG 160 12/29/2013 0437   HDL 30 (L) 09/01/2016 0807   HDL 29 (L) 12/29/2013 0437   CHOLHDL 3.0 09/01/2016 0807   VLDL 18 09/01/2016 0807   VLDL 32 12/29/2013 0437   LDLCALC 42 09/01/2016 0807   LDLCALC 43 12/29/2013 0437    CBC    Component Value Date/Time   WBC 13.0 (H) 10/31/2016 1227   RBC 4.20 10/31/2016 1227   HGB 10.0 (L) 10/31/2016 1227   HGB 13.1 10/11/2014 1117   HCT 31.6 (L) 10/31/2016 1227   HCT 39.7 10/11/2014 1117   PLT 272 10/31/2016 1227   PLT 190 10/11/2014 1117   MCV 75.2 (L) 10/31/2016 1227   MCV 89 10/11/2014 1117   MCH 23.8 (L) 10/31/2016 1227   MCHC 31.6 (L) 10/31/2016 1227   RDW 17.5 (H) 10/31/2016 1227   RDW 15.1 (H) 10/11/2014 1117   LYMPHSABS 2.4 10/30/2016 1548   LYMPHSABS 1.8 10/11/2014 1117   MONOABS 0.7 10/30/2016 1548   MONOABS 0.5 10/11/2014 1117   EOSABS 0.0 10/30/2016 1548   EOSABS 0.2 10/11/2014 1117   BASOSABS 0.0 10/30/2016 1548   BASOSABS 0.1 10/11/2014 1117    Hgb A1C No results found for: HGBA1C         Assessment & Plan:   Mycotic Toenails:  Trimmed by provider using dremmel tool  Peripheral Edema:  Encouraged elevation Continue Lasix prn  Will reassess as needed Webb Silversmith, NP

## 2016-12-01 NOTE — Patient Instructions (Signed)

## 2016-12-13 DIAGNOSIS — R262 Difficulty in walking, not elsewhere classified: Secondary | ICD-10-CM | POA: Diagnosis not present

## 2016-12-14 DIAGNOSIS — R262 Difficulty in walking, not elsewhere classified: Secondary | ICD-10-CM | POA: Diagnosis not present

## 2016-12-16 DIAGNOSIS — R262 Difficulty in walking, not elsewhere classified: Secondary | ICD-10-CM | POA: Diagnosis not present

## 2016-12-21 DIAGNOSIS — R262 Difficulty in walking, not elsewhere classified: Secondary | ICD-10-CM | POA: Diagnosis not present

## 2016-12-22 DIAGNOSIS — R262 Difficulty in walking, not elsewhere classified: Secondary | ICD-10-CM | POA: Diagnosis not present

## 2016-12-27 DIAGNOSIS — R262 Difficulty in walking, not elsewhere classified: Secondary | ICD-10-CM | POA: Diagnosis not present

## 2016-12-29 DIAGNOSIS — R262 Difficulty in walking, not elsewhere classified: Secondary | ICD-10-CM | POA: Diagnosis not present

## 2017-01-05 DIAGNOSIS — E7801 Familial hypercholesterolemia: Secondary | ICD-10-CM | POA: Diagnosis not present

## 2017-01-05 DIAGNOSIS — I4891 Unspecified atrial fibrillation: Secondary | ICD-10-CM | POA: Diagnosis not present

## 2017-01-05 DIAGNOSIS — I10 Essential (primary) hypertension: Secondary | ICD-10-CM | POA: Diagnosis not present

## 2017-02-01 NOTE — Progress Notes (Unsigned)
3:97 PM   Victoria Contreras [birth year] [id number]  Referring provider: Madelyn Brunner, MD [facility] [place],  [id number]  No chief complaint on file.   HPI: Patient is an 83 year old Caucasian female who presents today for a 3 month follow-up after being prescribed vaginal estrogen cream for atrophic vaginitis and dysuria.  Background history Patient presented today as a referral from her PCP, Dr. Gilford Rile, for persistent dysuria and frequency.  Patient presented with her son, [name], and stated that she had the sudden onset of tremendous suprapubic pressure and urgency.  She stated that she had sweats associated with the infections.   She denied any gross hematuria, fever, nausea or vomiting. She does not have a prior history of kidney stone disease. She stated that she was placed on an antibiotic, but did not find relief. She then stated she was placed on 2 courses of sulfur antibiotics and her symptoms did not improve.  Then her symptoms suddenly abated.    Today, she states she still has frequency of urination, urgency, nocturia and mixed urinary incontinence. She denies dysuria, gross hematuria and suprapubic pain. She's not had any recent fevers, chills, nausea or vomiting.  She is using the vaginal estrogen cream as prescribed, 3 nights weekly. She does not need a refill at this time.   PMH: Past Medical History:  Diagnosis Date  . Atrial fibrillation ([place])   . Chronic diastolic heart failure ([place])   . CLL (chronic lymphoid leukemia) in relapse ([place]) 04/17/2015  . CVA (cerebral infarction)   . Frequent nosebleeds   . Gout   . Hemangiopericytoma   . HLD (hyperlipidemia)   . HTN (hypertension)   . Mood disorder [facility])     Surgical History: Past Surgical History:  Procedure Laterality Date  . APPENDECTOMY    . BREAST LUMPECTOMY    . CATARACT EXTRACTION BILATERAL W/ ANTERIOR VITRECTOMY     right/left eye  . CHOLECYSTECTOMY    .  DILATION AND CURETTAGE OF UTERUS    . hemangiopericytoma resection    . TONSILLECTOMY    . TOTAL KNEE ARTHROPLASTY     right knee    Home Medications:  Allergies as of 02/03/2017      Reactions   Codeine    Other reaction(s): Unknown   Sulfa Antibiotics Other (See Comments)   Deathly sick    Tape Itching   Other reaction(s): Itching of Skin      Medication List       Accurate as of 02/01/17  3:33 PM. Always use your most recent med list.          allopurinol 300 MG tablet Commonly known as:  ZYLOPRIM Take 300 mg by mouth daily.   amiodarone 400 MG tablet Commonly known as:  PACERONE Take 1 tablet (400 mg total) by mouth 2 (two) times daily.   citalopram 20 MG tablet Commonly known as:  CELEXA Take 20 mg by mouth daily.   diltiazem 300 MG 24 hr capsule Commonly known as:  CARDIZEM CD Take 1 capsule (300 mg total) by mouth daily.   furosemide 20 MG tablet Commonly known as:  LASIX Take 20 mg by mouth daily. Take 20mg  daily if weight is > 190lb   metoprolol succinate 25 MG 24 hr tablet Commonly known as:  TOPROL-XL Take 25 mg by mouth daily.   ondansetron 4 MG tablet Commonly known as:  ZOFRAN Take 4 mg by mouth 3 (three)  times daily as needed for nausea or vomiting.   potassium chloride 10 MEQ tablet Commonly known as:  K-DUR Take 2 tablets (20 mEq total) by mouth daily.   rivaroxaban 20 MG Tabs tablet Commonly known as:  XARELTO Take 20 mg by mouth daily with supper.   rosuvastatin 5 MG tablet Commonly known as:  CRESTOR Take 1 tablet (5 mg total) by mouth daily.       Allergies:  Allergies  Allergen Reactions  . Codeine     Other reaction(s): Unknown  . Sulfa Antibiotics Other (See Comments)    Deathly sick   . Tape Itching    Other reaction(s): Itching of Skin    Family History: Family History  Problem Relation Age of Onset  . Other Father        "brain hemorrhage"  . Melanoma Daughter 57  . Alcoholism Sister   . Alzheimer's  disease Brother   . Alzheimer's disease Sister   . Cancer Sister   . Kidney disease Neg Hx   . Bladder Cancer Neg Hx     Social History:  reports that she has quit smoking. Her smoking use included Cigarettes. She has never used smokeless tobacco. She reports that she does not drink alcohol or use drugs.  ROS:                                        Physical Exam: There were no vitals taken for this visit.  Constitutional: Well nourished. Alert and oriented, No acute distress. HEENT: [place] AT, moist mucus membranes. Trachea midline, no masses. Cardiovascular: No clubbing, cyanosis, or edema. Respiratory: Normal respiratory effort, no increased work of breathing. GI: Abdomen is soft, non tender, non distended, no abdominal masses. Liver and spleen not palpable.  No hernias appreciated.  Stool sample for occult testing is not indicated.   GU: No CVA tenderness.  No bladder fullness or masses.  Atrophic external genitalia, normal pubic hair distribution, no lesions.  Normal urethral meatus, no lesions, no prolapse, no discharge.   Urethral caruncle is noted.  No bladder fullness, tenderness or masses. Normal vagina mucosa, good estrogen effect, no discharge, no lesions, good pelvic support, Grade II cystocele is noted.  No rectocele is noted.  No cervical motion tenderness.  Uterus is freely mobile and non-fixed.  No adnexal/parametria masses or tenderness noted.  Anus and perineum are without rashes or lesions.    Skin: No rashes, bruises or suspicious lesions. Lymph: No cervical or inguinal adenopathy. Neurologic: Grossly intact, no focal deficits, moving all 4 extremities. Psychiatric: Normal mood and affect.  Laboratory Data: Lab Results  Component Value Date   WBC 13.0 (H) 10/31/2016   HGB 10.0 (L) 10/31/2016   HCT 31.6 (L) 10/31/2016   MCV 75.2 (L) 10/31/2016   PLT 272 10/31/2016    Lab Results  Component Value Date   CREATININE 0.90 10/31/2016          Component Value Date/Time   CHOL 90 09/01/2016 0807   CHOL 104 12/29/2013 0437   HDL 30 (L) 09/01/2016 0807   HDL 29 (L) 12/29/2013 0437   CHOLHDL 3.0 09/01/2016 0807   VLDL 18 09/01/2016 0807   VLDL 32 12/29/2013 0437   LDLCALC 42 09/01/2016 0807   LDLCALC 43 12/29/2013 0437    Lab Results  Component Value Date   AST 49 (H) 10/30/2016   Lab Results  Component Value Date   ALT 24 10/30/2016    Assessment & Plan:    1. Dysuria:   Resolved.    2. Cystocele:   Patient with a grade 2 cystocele. She does have symptoms of stress urinary incontinence for which she is managing with depends at this time. We will continue to monitor.   3. Atrophic vaginitis:   Patient will continue the vaginal estrogen cream, applying it 3 nights weekly. She will follow-up in 12 months for exam and symptom recheck.   No Follow-up on file.  These notes generated with voice recognition software. I apologize for typographical errors.  Zara Council, [facility] [address], [place], [place] [address] [phone]

## 2017-02-03 ENCOUNTER — Ambulatory Visit: Payer: Medicare Other | Admitting: Urology

## 2017-02-04 ENCOUNTER — Ambulatory Visit: Payer: Medicare Other | Admitting: Urology

## 2017-03-03 ENCOUNTER — Encounter: Payer: Self-pay | Admitting: Internal Medicine

## 2017-03-03 ENCOUNTER — Non-Acute Institutional Stay: Payer: Medicare Other | Admitting: Internal Medicine

## 2017-03-03 VITALS — BP 108/64 | HR 90 | Temp 98.2°F | Resp 18 | Wt 196.0 lb

## 2017-03-03 DIAGNOSIS — I5032 Chronic diastolic (congestive) heart failure: Secondary | ICD-10-CM

## 2017-03-03 DIAGNOSIS — C9190 Lymphoid leukemia, unspecified not having achieved remission: Secondary | ICD-10-CM

## 2017-03-03 DIAGNOSIS — C9110 Chronic lymphocytic leukemia of B-cell type not having achieved remission: Secondary | ICD-10-CM

## 2017-03-03 DIAGNOSIS — I4819 Other persistent atrial fibrillation: Secondary | ICD-10-CM

## 2017-03-03 DIAGNOSIS — F39 Unspecified mood [affective] disorder: Secondary | ICD-10-CM | POA: Diagnosis not present

## 2017-03-03 DIAGNOSIS — I481 Persistent atrial fibrillation: Secondary | ICD-10-CM

## 2017-03-03 NOTE — Progress Notes (Signed)
Subjective:    Patient ID: Victoria Contreras, female    DOB: 27-Oct-1924, 83 y.o.   MRN: [id number]  HPI Visit to Victoria Contreras apartment for review of chronic health conditions Reviewed recent cardiology visit No longer on metoprolol Reviewed status with Victoria Contreras to get around only in wheelchair Does walk with restorative staff in hall with the rollator--most days Aide in AM to help with bathing and dressing Independent with bathroom---occ urinary incontinence (mostly after furosemide)  No SOB Sleeps in bed--propped up some. NO PND but does awaken at 2AM regularly No chest pain No palpitations Stable edema  Mood is okay No regular depression or anxiety Satisfied with the citalopram  No recent CBC No fevers or other systemic symptoms  Current Outpatient Prescriptions on File Prior to Visit  Medication Sig Dispense Refill  . allopurinol (ZYLOPRIM) 300 MG tablet Take 300 mg by mouth daily.    . citalopram (CELEXA) 20 MG tablet Take 20 mg by mouth daily.    Victoria Contreras Kitchen diltiazem (CARDIZEM CD) 300 MG 24 hr capsule Take 1 capsule (300 mg total) by mouth daily. 30 capsule 0  . furosemide (LASIX) 20 MG tablet Take 20 mg by mouth daily. Take 20mg  daily if weight is > 190lb    . ondansetron (ZOFRAN) 4 MG tablet Take 4 mg by mouth 3 (three) times daily as needed for nausea or vomiting.    . potassium chloride (K-DUR) 10 MEQ tablet Take 2 tablets (20 mEq total) by mouth daily. 90 tablet 0  . rivaroxaban (XARELTO) 20 MG TABS tablet Take 20 mg by mouth daily with supper.    . rosuvastatin (CRESTOR) 5 MG tablet Take 1 tablet (5 mg total) by mouth daily. 30 tablet 0   No current facility-administered medications on file prior to visit.     Allergies  Allergen Reactions  . Codeine     Other reaction(s): Unknown  . Sulfa Antibiotics Other (See Comments)    Deathly sick   . Tape Itching    Other reaction(s): Itching of Skin    Past Medical History:  Diagnosis Date  . Atrial fibrillation  (Victoria Contreras)   . Chronic diastolic heart failure (Victoria Contreras)   . CLL (chronic lymphoid leukemia) in relapse (Victoria Contreras) 04/17/2015  . CVA (cerebral infarction)   . Frequent nosebleeds   . Gout   . Hemangiopericytoma   . HLD (hyperlipidemia)   . HTN (hypertension)   . Mood disorder Victoria Contreras)     Past Surgical History:  Procedure Laterality Date  . APPENDECTOMY    . BREAST LUMPECTOMY    . CATARACT EXTRACTION BILATERAL W/ ANTERIOR VITRECTOMY     right/left eye  . CHOLECYSTECTOMY    . DILATION AND CURETTAGE OF UTERUS    . hemangiopericytoma resection    . TONSILLECTOMY    . TOTAL KNEE ARTHROPLASTY     right knee    Family History  Problem Relation Age of Onset  . Other Father        "brain hemorrhage"  . Melanoma Daughter 54  . Alcoholism Sister   . Alzheimer's disease Brother   . Alzheimer's disease Sister   . Cancer Sister   . Kidney disease Neg Hx   . Bladder Cancer Neg Hx     Social History   Social History  . Marital status: Widowed    Spouse name: N/A  . Number of children: 5  . Years of education: N/A   Occupational History  . Secretary  Retired   Social History Main Topics  . Smoking status: Former Smoker    Types: Cigarettes  . Smokeless tobacco: Never Used     Comment: quit 1990  . Alcohol use No  . Drug use: No  . Sexual activity: Not on file   Other Topics Concern  . Not on file   Social History Narrative   Widowed 2003-12-12. 5 children-- 1 son died   56 daughters/1 son living      Has living will   Son Victoria Contreras/daughter Victoria Contreras--health care POAs   Would accept resuscitation.   No tube feeds if cognitively unaware   Review of Systems Some night time awakening-- not really tired in day (but likes after lunch nap) Appetite is limited---likes soup but not many types of food Some nausea--the med will help if she gets it No weight loss Bowels are okay    Objective:   Physical Exam  Constitutional: She appears well-nourished. No distress.  Neck: No thyromegaly  present.  Cardiovascular: Normal heart sounds.  Exam reveals no gallop.   No murmur heard. Irregular ~100  Pulmonary/Chest: Effort normal and breath sounds normal. No respiratory distress. She has no wheezes. She has no rales.  Dullness at bases bilaterally  Abdominal: Soft. There is no tenderness.  Musculoskeletal:  1-2+ edema to upper calf. Support hose on  Lymphadenopathy:    She has no cervical adenopathy.  Psychiatric: She has a normal mood and affect. Her behavior is normal.          Assessment & Plan:

## 2017-03-03 NOTE — Assessment & Plan Note (Signed)
Compensated ??small effusions but no symptoms and weight stable Incontinent with the furosemide--but explained that she needs to continue

## 2017-03-03 NOTE — Assessment & Plan Note (Signed)
Chronic mood issues Quiet on the citalopram Doesn't want wean due to chronicity

## 2017-03-03 NOTE — Assessment & Plan Note (Signed)
Only mild elevation of WBC Will recheck labs

## 2017-03-03 NOTE — Assessment & Plan Note (Signed)
Borderline rate as usual On maintenance amio and xarelto

## 2017-03-07 DIAGNOSIS — I482 Chronic atrial fibrillation: Secondary | ICD-10-CM | POA: Diagnosis not present

## 2017-03-08 ENCOUNTER — Inpatient Hospital Stay
Admission: EM | Admit: 2017-03-08 | Discharge: 2017-03-09 | DRG: 378 | Disposition: A | Discharge status: Home / Self Care | Payer: Medicare Other | Attending: Internal Medicine | Admitting: Internal Medicine

## 2017-03-08 DIAGNOSIS — I110 Hypertensive heart disease with heart failure: Secondary | ICD-10-CM | POA: Diagnosis present

## 2017-03-08 DIAGNOSIS — D649 Anemia, unspecified: Secondary | ICD-10-CM | POA: Diagnosis present

## 2017-03-08 DIAGNOSIS — Z882 Allergy status to sulfonamides status: Secondary | ICD-10-CM | POA: Diagnosis not present

## 2017-03-08 DIAGNOSIS — K922 Gastrointestinal hemorrhage, unspecified: Secondary | ICD-10-CM | POA: Diagnosis not present

## 2017-03-08 DIAGNOSIS — Z8673 Personal history of transient ischemic attack (TIA), and cerebral infarction without residual deficits: Secondary | ICD-10-CM | POA: Diagnosis not present

## 2017-03-08 DIAGNOSIS — Z885 Allergy status to narcotic agent status: Secondary | ICD-10-CM | POA: Diagnosis not present

## 2017-03-08 DIAGNOSIS — K921 Melena: Principal | ICD-10-CM | POA: Diagnosis present

## 2017-03-08 DIAGNOSIS — Z888 Allergy status to other drugs, medicaments and biological substances status: Secondary | ICD-10-CM

## 2017-03-08 DIAGNOSIS — M109 Gout, unspecified: Secondary | ICD-10-CM | POA: Diagnosis present

## 2017-03-08 DIAGNOSIS — D509 Iron deficiency anemia, unspecified: Secondary | ICD-10-CM | POA: Diagnosis present

## 2017-03-08 DIAGNOSIS — I4891 Unspecified atrial fibrillation: Secondary | ICD-10-CM | POA: Diagnosis not present

## 2017-03-08 DIAGNOSIS — E785 Hyperlipidemia, unspecified: Secondary | ICD-10-CM | POA: Diagnosis not present

## 2017-03-08 DIAGNOSIS — Z7901 Long term (current) use of anticoagulants: Secondary | ICD-10-CM | POA: Diagnosis not present

## 2017-03-08 DIAGNOSIS — C9112 Chronic lymphocytic leukemia of B-cell type in relapse: Secondary | ICD-10-CM | POA: Diagnosis present

## 2017-03-08 DIAGNOSIS — I10 Essential (primary) hypertension: Secondary | ICD-10-CM | POA: Diagnosis not present

## 2017-03-08 DIAGNOSIS — I482 Chronic atrial fibrillation: Secondary | ICD-10-CM | POA: Diagnosis present

## 2017-03-08 DIAGNOSIS — K219 Gastro-esophageal reflux disease without esophagitis: Secondary | ICD-10-CM | POA: Diagnosis present

## 2017-03-08 DIAGNOSIS — I5032 Chronic diastolic (congestive) heart failure: Secondary | ICD-10-CM | POA: Diagnosis present

## 2017-03-08 DIAGNOSIS — R718 Other abnormality of red blood cells: Secondary | ICD-10-CM

## 2017-03-08 DIAGNOSIS — R000 Tachycardia, unspecified: Secondary | ICD-10-CM | POA: Diagnosis not present

## 2017-03-08 LAB — CBC
HCT: 23.6 % — ABNORMAL LOW (ref 35.0–47.0)
Hemoglobin: 6.7 g/dL — ABNORMAL LOW (ref 12.0–16.0)
MCH: 17.1 pg — AB (ref 26.0–34.0)
MCHC: 28.4 g/dL — ABNORMAL LOW (ref 32.0–36.0)
MCV: 60 fL — AB (ref 80.0–100.0)
PLATELETS: 309 10*3/uL (ref 150–440)
RBC: 3.93 MIL/uL (ref 3.80–5.20)
RDW: 20.1 % — AB (ref 11.5–14.5)
WBC: 12.4 10*3/uL — ABNORMAL HIGH (ref 3.6–11.0)

## 2017-03-08 LAB — BASIC METABOLIC PANEL
Anion gap: 10 (ref 5–15)
BUN: 16 mg/dL (ref 6–20)
CO2: 25 mmol/L (ref 22–32)
Calcium: 8.8 mg/dL — ABNORMAL LOW (ref 8.9–10.3)
Chloride: 105 mmol/L (ref 101–111)
Creatinine, Ser: 1.08 mg/dL — ABNORMAL HIGH (ref 0.44–1.00)
GFR calc Af Amer: 51 mL/min — ABNORMAL LOW (ref >60)
GFR, EST NON AFRICAN AMERICAN: 44 mL/min — AB (ref >60)
GLUCOSE: 104 mg/dL — AB (ref 65–99)
POTASSIUM: 4.1 mmol/L (ref 3.5–5.1)
Sodium: 140 mmol/L (ref 135–145)

## 2017-03-08 LAB — PREPARE RBC (CROSSMATCH)

## 2017-03-08 LAB — APTT: aPTT: 48 seconds — ABNORMAL HIGH (ref 24–36)

## 2017-03-08 LAB — PROTIME-INR
INR: 4
Prothrombin Time: 40 seconds — ABNORMAL HIGH (ref 11.4–15.2)

## 2017-03-08 LAB — ABO/RH: ABO/RH(D): O POS

## 2017-03-08 MED ORDER — POLYETHYLENE GLYCOL 3350 17 G PO PACK: 17.0000 g | PACK | Freq: Every day | ORAL | Status: DC | PRN

## 2017-03-08 MED ORDER — ONDANSETRON HCL 4 MG/2ML IJ SOLN: 4.0000 mg | Freq: Four times a day (QID) | INTRAMUSCULAR | Status: DC | PRN

## 2017-03-08 MED ORDER — ACETAMINOPHEN 325 MG PO TABS: 650.0000 mg | ORAL_TABLET | Freq: Four times a day (QID) | ORAL | Status: DC | PRN

## 2017-03-08 MED ORDER — ACETAMINOPHEN 650 MG RE SUPP: 650.0000 mg | Freq: Four times a day (QID) | RECTAL | Status: DC | PRN

## 2017-03-08 MED ORDER — PANTOPRAZOLE SODIUM 40 MG IV SOLR
40.0000 mg | Freq: Two times a day (BID) | INTRAVENOUS | Status: DC
  Administered 2017-03-08 – 2017-03-09 (×2): 40 mg via INTRAVENOUS
  Filled 2017-03-08 (×2): qty 40

## 2017-03-08 MED ORDER — SODIUM CHLORIDE 0.9 % IV SOLN
Freq: Once | INTRAVENOUS | Status: AC
  Administered 2017-03-08: 21:00:00 via INTRAVENOUS

## 2017-03-08 MED ORDER — SODIUM CHLORIDE 0.9 % IV SOLN
INTRAVENOUS | Status: DC
  Administered 2017-03-09: 01:00:00 via INTRAVENOUS

## 2017-03-08 MED ORDER — ONDANSETRON HCL 4 MG PO TABS: 4.0000 mg | ORAL_TABLET | Freq: Four times a day (QID) | ORAL | Status: DC | PRN

## 2017-03-08 NOTE — ED Provider Notes (Signed)
[facility] Emergency Department Provider Note   ____________________________________________   First MD Initiated Contact with Patient 03/08/17 1606     (approximate)  I have reviewed the triage vital signs and the nursing notes.   HISTORY  Chief Complaint Abnormal Lab    HPI Victoria Contreras is a 83 y.o. female here for evaluation for a low blood count  Saw her primary yesterday as she's been having fatigue, ongoing nausea for about a month. She reports that he received a call that she come to the hospital for low blood count and brings. Worked with a hemoglobin of 6.5  She denies any other symptoms. Reports and mild fatigue and nausea for about 1-2 months takes Zofran for at home. No chest pain no shortness of breath. No lightheadedness. No racing heart. No other symptoms. Never had a transfusion in the past. Has not seen any dark black or bloody stools No diarrhea     Past Medical History:  Diagnosis Date  . Atrial fibrillation ([place])   . Chronic diastolic heart failure ([place])   . CLL (chronic lymphoid leukemia) in relapse ([place]) 04/17/2015  . CVA (cerebral infarction)   . Frequent nosebleeds   . Gout   . Hemangiopericytoma   . HLD (hyperlipidemia)   . HTN (hypertension)   . Mood disorder [facility])     Patient Active Problem List   Diagnosis Date Noted  . Chronic diastolic heart failure ([place])   . Mood disorder ([place])   . Persistent atrial fibrillation ([place]) 08/31/2016  . Dysuria 11/06/2015  . Cystocele, grade 2 11/06/2015  . Atrophic vaginitis 11/06/2015  . CLL (chronic lymphocytic leukemia) ([place]) 04/17/2015  . 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 APPENDECTOMY    . BREAST LUMPECTOMY    . CATARACT EXTRACTION BILATERAL W/ ANTERIOR VITRECTOMY     right/left eye  . CHOLECYSTECTOMY    . DILATION AND CURETTAGE OF UTERUS    . hemangiopericytoma resection    . TONSILLECTOMY    . TOTAL KNEE  ARTHROPLASTY     right knee    Prior to Admission medications   Medication Sig Start Date End Date Taking? Authorizing Provider  allopurinol (ZYLOPRIM) 300 MG tablet Take 300 mg by mouth daily.    [provider]  amiodarone (PACERONE) 200 MG tablet Take 200 mg by mouth daily.    [provider]  citalopram (CELEXA) 20 MG tablet Take 20 mg by mouth daily.    [provider]  diltiazem (CARDIZEM CD) 300 MG 24 hr capsule Take 1 capsule (300 mg total) by mouth daily. 09/08/16   [name], MD  furosemide (LASIX) 20 MG tablet Take 20 mg by mouth daily. Take 20mg  daily if weight is > 190lb    [provider]  ondansetron (ZOFRAN) 4 MG tablet Take 4 mg by mouth 3 (three) times daily as needed for nausea or vomiting.    [provider]  potassium chloride (K-DUR) 10 MEQ tablet Take 2 tablets (20 mEq total) by mouth daily. 10/30/16   [name], MD  rivaroxaban (XARELTO) 20 MG TABS tablet Take 20 mg by mouth daily with supper.    [provider]  rosuvastatin (CRESTOR) 5 MG tablet Take 1 tablet (5 mg total) by mouth daily. 09/07/16   [name], MD    Allergies Codeine; Sulfa antibiotics; and Tape  Family History  Problem Relation Age of Onset  . Other Father        "  brain hemorrhage"  . Melanoma Daughter 31  . Alcoholism Sister   . Alzheimer's disease Brother   . Alzheimer's disease Sister   . Cancer Sister   . Kidney disease Neg Hx   . Bladder Cancer Neg Hx     Social History Social History  Substance Use Topics  . Smoking status: Former Smoker    Types: Cigarettes  . Smokeless tobacco: Never Used     Comment: quit 1990  . Alcohol use No    Review of Systems Constitutional: No fever/chills Eyes: No visual changes. ENT: No sore throat. Cardiovascular: Denies chest pain. Respiratory: Denies shortness of breath. Gastrointestinal: No abdominal pain.  No vomiting.  No diarrhea.  No  constipation. Genitourinary: Negative for dysuria. Musculoskeletal: Negative for back pain. Skin: Negative for rash. Neurological: Negative for headaches, focal weakness or numbness.    ____________________________________________   PHYSICAL EXAM:  VITAL SIGNS: ED Triage Vitals  Enc Vitals Group     BP 03/08/17 1554 111/63     Pulse Rate 03/08/17 1554 (!) 112     Resp 03/08/17 1554 20     Temp 03/08/17 1554 99.1 F (37.3 C)     Temp Source 03/08/17 1554 Oral     SpO2 03/08/17 1554 90 %     Weight 03/08/17 1551 194 lb (88 kg)     Height 03/08/17 1551 5\' 9"  (1.753 m)     Head Circumference --      Peak Flow --      Pain Score --      Pain Loc --      Pain Edu? --      Excl. in [place]? --     Constitutional: Alert and oriented. Well appearing and in no acute distress. Eyes: Conjunctivae are normal. Head: Atraumatic. Nose: No congestion/rhinnorhea. Mouth/Throat: Mucous membranes are moist. Neck: No stridor.   Cardiovascular: Minimally tachycardic rate, regular rhythm. Grossly normal heart sounds.  Good peripheral circulation. Respiratory: Normal respiratory effort.  No retractions. Lungs CTAB. Gastrointestinal: Soft and nontender. No distention. Musculoskeletal: No lower extremity tenderness nor edema. Neurologic:  Normal speech and language. No gross focal neurologic deficits are appreciated.  Skin:  Skin is warm, dry and intact. No rash noted. Psychiatric: Mood and affect are normal. Speech and behavior are normal.  ____________________________________________   LABS (all labs ordered are listed, but only abnormal results are displayed)  Labs Reviewed  CBC - Abnormal; Notable for the following:       Result Value   WBC 12.4 (*)    Hemoglobin 6.7 (*)    HCT 23.6 (*)    MCV 60.0 (*)    MCH 17.1 (*)    MCHC 28.4 (*)    RDW 20.1 (*)    All other components within normal limits  BASIC METABOLIC PANEL - Abnormal; Notable for the following:    Glucose, Bld 104 (*)     Creatinine, Ser 1.08 (*)    Calcium 8.8 (*)    GFR calc non Af Amer 44 (*)    GFR calc Af Amer 51 (*)    All other components within normal limits  PROTIME-INR - Abnormal; Notable for the following:    Prothrombin Time 40.0 (*)    All other components within normal limits  APTT - Abnormal; Notable for the following:    aPTT 48 (*)    All other components within normal limits  OCCULT BLOOD X 1 CARD TO LAB, STOOL  TYPE AND SCREEN   ____________________________________________  EKG  Reviewed and interpreted me at 1620 Heart rate [address] 140 QTc 516  tachycardia, right bundle branch block. Probably atrial fibrillation ____________________________________________  RADIOLOGY   ____________________________________________   PROCEDURES  Procedure(s) performed: None  Procedures  Critical Care performed: No  ____________________________________________   INITIAL IMPRESSION / ASSESSMENT AND PLAN / ED COURSE  Pertinent labs & imaging results that were available during my care of the patient were reviewed by me and considered in my medical decision making (see chart for details).  Patient presents for evaluation of low blood count. No obvious symptomatology other than she's in reporting fatigue, some nausea for the someone to 2 months. She does takes Xarelto however. Patient deferred examination of the rectum for blood in her stool today, but is willing to provide a sample of stool for testing in the lab when she does stool.   ----------------------------------------- 5:23 PM on 03/08/2017 -----------------------------------------  Patient hemoglobin low. INR also elevated at 4, which seems somewhat unusual given that she is on Xarelto and not on Coumadin, but will admit patient for further workup of the hospitalist service due to anemia and being on anticoagulation. ____________________________________________   FINAL CLINICAL IMPRESSION(S) / ED DIAGNOSES  Final  diagnoses:  Anemia, unspecified type  Low hemoglobin  Low mean corpuscular volume (MCV)      NEW MEDICATIONS STARTED DURING THIS VISIT:  New Prescriptions   No medications on file     Note:  This document was prepared using Dragon voice recognition software and may include unintentional dictation errors.     Delman Kitten, MD 03/08/17 [phone]

## 2017-03-08 NOTE — ED Notes (Signed)
Hemoccult

## 2017-03-08 NOTE — ED Triage Notes (Addendum)
Pt to triage via wheelchair. Pt sent by dr Silvio Pate for abnormal lab of hgb 6.0 yesterday.  Pt denies weakness.  No pain.  No v/d.  Hx afib. Pt alert   Speech clear.  Resident of [place].  Brought in by daughter.

## 2017-03-08 NOTE — ED Notes (Signed)
AAOx3.  Skin warm and dry.  NAD 

## 2017-03-08 NOTE — H&P (Addendum)
[facility] [place] NAME: Victoria Contreras    MR#:  [id number]  DATE OF BIRTH:  May 30, 1925  DATE OF ADMISSION:  03/08/2017  PRIMARY CARE PHYSICIAN: [name], MD   REQUESTING/REFERRING PHYSICIAN: Dr Jacqualine Code  CHIEF COMPLAINT:  Sent in from Dr Alla German office with Hgb 6.6  HISTORY OF PRESENT ILLNESS:  Victoria Contreras  is a 83 y.o. 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 Patient has been having significant nausea for last several weeks along with bloating and belching she does not take anything for GERD. She denies any weight loss. Denies any bloody stools or any pain in the abdomen. She was found to have microcytic anemia on all her blood work. She was heme occult positive in the emergency room. Hemoglobin is 6.6 she is being admitted for further evaluation and management. Patient will get 1 unit of blood transfusion. She is okay with it. Risk and benefits explained to patient.   PAST MEDICAL HISTORY:   Past Medical History:  Diagnosis Date  . Atrial fibrillation ([place])   . Chronic diastolic heart failure ([place])   . CLL (chronic lymphoid leukemia) in relapse ([place]) 04/17/2015  . CVA (cerebral infarction)   . Frequent nosebleeds   . Gout   . Hemangiopericytoma   . HLD (hyperlipidemia)   . HTN (hypertension)   . Mood disorder ([place])     PAST SURGICAL HISTOIRY:   Past Surgical History:  Procedure Laterality Date  . APPENDECTOMY    . BREAST LUMPECTOMY    . CATARACT EXTRACTION BILATERAL W/ ANTERIOR VITRECTOMY     right/left eye  . CHOLECYSTECTOMY    . DILATION AND CURETTAGE OF UTERUS    . hemangiopericytoma resection    . TONSILLECTOMY    . TOTAL KNEE ARTHROPLASTY     right knee    SOCIAL HISTORY:   Social  History  Substance Use Topics  . Smoking status: Former Smoker    Types: Cigarettes  . Smokeless tobacco: Never Used     Comment: quit 1990  . Alcohol use No    FAMILY HISTORY:   Family History  Problem Relation Age of Onset  . Other Father        "brain hemorrhage"  . Melanoma Daughter 60  . Alcoholism Sister   . Alzheimer's disease Brother   . Alzheimer's disease Sister   . Cancer Sister   . Kidney disease Neg Hx   . Bladder Cancer Neg Hx     DRUG ALLERGIES:   Allergies  Allergen Reactions  . Codeine     Other reaction(s): Unknown  . Sulfa Antibiotics Other (See Comments)    Deathly sick   . Tape Itching    Other reaction(s): Itching of Skin    REVIEW OF SYSTEMS:  Review of Systems  Constitutional: Negative for chills, fever and weight loss.  HENT: Negative for ear discharge, ear pain and nosebleeds.   Eyes: Negative for blurred vision, pain and discharge.  Respiratory: Negative for sputum production, shortness of breath, wheezing and stridor.   Cardiovascular: Negative for chest pain, palpitations, orthopnea and PND.  Gastrointestinal: Positive for blood in stool, heartburn and nausea. Negative for abdominal pain, diarrhea and vomiting.  Genitourinary: Negative for frequency and urgency.  Musculoskeletal: Negative for back  pain and joint pain.  Neurological: Positive for weakness. Negative for sensory change, speech change and focal weakness.  Psychiatric/Behavioral: Negative for depression and hallucinations. The patient is not nervous/anxious.      MEDICATIONS AT HOME:   Prior to Admission medications   Medication Sig Start Date End Date Taking? Authorizing Provider  allopurinol (ZYLOPRIM) 300 MG tablet Take 300 mg by mouth daily.    [provider]  amiodarone (PACERONE) 200 MG tablet Take 200 mg by mouth daily.    [provider]  citalopram (CELEXA) 20 MG tablet Take 20 mg by mouth daily.    [provider]  diltiazem  (CARDIZEM CD) 300 MG 24 hr capsule Take 1 capsule (300 mg total) by mouth daily. 09/08/16   [name], MD  furosemide (LASIX) 20 MG tablet Take 20 mg by mouth daily. Take 20mg  daily if weight is > 190lb    [provider]  ondansetron (ZOFRAN) 4 MG tablet Take 4 mg by mouth 3 (three) times daily as needed for nausea or vomiting.    [provider]  potassium chloride (K-DUR) 10 MEQ tablet Take 2 tablets (20 mEq total) by mouth daily. 10/30/16   [name], MD  rivaroxaban (XARELTO) 20 MG TABS tablet Take 20 mg by mouth daily with supper.    [provider]  rosuvastatin (CRESTOR) 5 MG tablet Take 1 tablet (5 mg total) by mouth daily. 09/07/16   [name], MD      VITAL SIGNS:  Blood pressure 111/63, pulse (!) 112, temperature 99.1 F (37.3 C), temperature source Oral, resp. rate 20, height 5\' 9"  (1.753 m), weight 88 kg (194 lb), SpO2 90 %.  PHYSICAL EXAMINATION:  GENERAL:  83 y.o.-year-old patient lying in the bed with no acute distress.  EYES: Pupils equal, round, reactive to light and accommodation. No scleral icterus. Extraocular muscles intact.  HEENT: Head atraumatic, normocephalic. Oropharynx and nasopharynx clear.  Pallor+ NECK:  Supple, no jugular venous distention. No thyroid enlargement, no tenderness.  LUNGS: Normal breath sounds bilaterally, no wheezing, rales,rhonchi or crepitation. No use of accessory muscles of respiration.  CARDIOVASCULAR: S1, S2 normal. No murmurs, rubs, or gallops.  ABDOMEN: Soft, nontender, nondistended. Bowel sounds present. No organomegaly or mass.  EXTREMITIES: No pedal edema, cyanosis, or clubbing.  NEUROLOGIC: Cranial nerves II through XII are intact. Muscle strength 5/5 in all extremities. Sensation intact. Gait not checked.  PSYCHIATRIC: The patient is alert and oriented x 3.  SKIN: No obvious rash, lesion, or ulcer.   LABORATORY PANEL:   CBC  Recent Labs Lab 03/08/17 1610  WBC 12.4*  HGB  6.7*  HCT 23.6*  PLT 309   ------------------------------------------------------------------------------------------------------------------  Chemistries   Recent Labs Lab 03/08/17 1610  NA 140  K 4.1  CL 105  CO2 25  GLUCOSE 104*  BUN 16  CREATININE 1.08*  CALCIUM 8.8*   ------------------------------------------------------------------------------------------------------------------  Cardiac Enzymes No results for input(s): TROPONINI in the last 168 hours. ------------------------------------------------------------------------------------------------------------------  RADIOLOGY:  No results found.  EKG:    IMPRESSION AND PLAN:  Victoria Contreras  is a 83 y.o. 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 Patient has been having significant nausea for last several weeks along with bloating and belching she does not take anything for GERD.  1. GI bleed -Patient  came in with a bilirubin of 6.6. She is on oral Xarelto for chronic A. fib. She is Hemoccult positive. -She has been having symptoms of nausea poor appetite and bloating with belching suspect upper GI bleed/peptic ulcer disease -IV protonix 40 mg bid -We'll transfuse 1 unit of blood transfusion -GI consultation. Page Dr. Vicente Males -Came in with hemoglobin of 6.6----one unit of blood transfusion--- CBC tomorrow -Hold Xarelto -Patient has microcytic anemia. Consider doing CT scan of the abdomen to rule out any malignancy. Would defer this to GI.  2. Chronic A. fib rate controlled -Hold Xarelto the setting of GI bleed  3. GERD -IV Protonix twice a day  4. History of diverticulosis -Patient had colonoscopy about 5 years ago that showed diverticulosis only this was told to me by patient's daughter   5.  DVT prophylaxis SCD teds  Above was discussed with patient and daughter and patient. She is a full code.   All the records are reviewed and case discussed with ED provider. Management plans discussed with the patient, family and they are in agreement.  CODE STATUS: full  TOTAL TIME TAKING CARE OF THIS PATIENT: 50 minutes.    Victoria Contreras M.D on 03/08/2017 at 5:58 PM  Between 7am to 6pm - Pager - [phone]  After 6pm go to [email or web address] - password [email or web address] [facility]  [facility]  Office  [phone]  CC: Primary care physician; [name], MD

## 2017-03-08 NOTE — Progress Notes (Addendum)
Case was discussed with Dr. Vicente Males on the phone. Is being July 4 weekend no GI coverage tomorrow. The patient will be seen on Thursday. Further GI luminal evaluation will be done earliest likely on Friday.  Patient is not actively bleeding and so not an indication for transfer to tertiary care center.

## 2017-03-08 NOTE — ED Notes (Signed)
Patient denies pain and is resting comfortably.  

## 2017-03-09 DIAGNOSIS — I482 Chronic atrial fibrillation: Secondary | ICD-10-CM | POA: Diagnosis not present

## 2017-03-09 DIAGNOSIS — I110 Hypertensive heart disease with heart failure: Secondary | ICD-10-CM | POA: Diagnosis not present

## 2017-03-09 DIAGNOSIS — D649 Anemia, unspecified: Secondary | ICD-10-CM | POA: Diagnosis not present

## 2017-03-09 DIAGNOSIS — I10 Essential (primary) hypertension: Secondary | ICD-10-CM | POA: Diagnosis not present

## 2017-03-09 DIAGNOSIS — M109 Gout, unspecified: Secondary | ICD-10-CM | POA: Diagnosis not present

## 2017-03-09 DIAGNOSIS — C9112 Chronic lymphocytic leukemia of B-cell type in relapse: Secondary | ICD-10-CM | POA: Diagnosis not present

## 2017-03-09 DIAGNOSIS — D509 Iron deficiency anemia, unspecified: Secondary | ICD-10-CM | POA: Diagnosis not present

## 2017-03-09 DIAGNOSIS — K921 Melena: Secondary | ICD-10-CM | POA: Diagnosis not present

## 2017-03-09 DIAGNOSIS — E785 Hyperlipidemia, unspecified: Secondary | ICD-10-CM | POA: Diagnosis not present

## 2017-03-09 DIAGNOSIS — K922 Gastrointestinal hemorrhage, unspecified: Secondary | ICD-10-CM | POA: Diagnosis not present

## 2017-03-09 DIAGNOSIS — I5032 Chronic diastolic (congestive) heart failure: Secondary | ICD-10-CM | POA: Diagnosis not present

## 2017-03-09 LAB — CBC
HCT: 24.3 % — ABNORMAL LOW (ref 35.0–47.0)
HEMOGLOBIN: 7.3 g/dL — AB (ref 12.0–16.0)
MCH: 19 pg — AB (ref 26.0–34.0)
MCHC: 29.9 g/dL — AB (ref 32.0–36.0)
MCV: 63.7 fL — ABNORMAL LOW (ref 80.0–100.0)
PLATELETS: 238 10*3/uL (ref 150–440)
RBC: 3.82 MIL/uL (ref 3.80–5.20)
RDW: 23.2 % — ABNORMAL HIGH (ref 11.5–14.5)
WBC: 9.7 10*3/uL (ref 3.6–11.0)

## 2017-03-09 LAB — HEMOGLOBIN: HEMOGLOBIN: 8.8 g/dL — AB (ref 12.0–16.0)

## 2017-03-09 LAB — MRSA PCR SCREENING: MRSA by PCR: NEGATIVE

## 2017-03-09 LAB — PREPARE RBC (CROSSMATCH)

## 2017-03-09 MED ORDER — ALLOPURINOL 300 MG PO TABS
300.0000 mg | ORAL_TABLET | Freq: Every day | ORAL | Status: DC
Start: 2017-03-09 — End: 2017-03-09
  Filled 2017-03-09: qty 1

## 2017-03-09 MED ORDER — PANTOPRAZOLE SODIUM 40 MG PO TBEC: 40.0000 mg | DELAYED_RELEASE_TABLET | Freq: Every day | ORAL | 1 refills | Status: DC

## 2017-03-09 MED ORDER — DILTIAZEM HCL ER COATED BEADS 300 MG PO CP24
300.0000 mg | ORAL_CAPSULE | Freq: Every day | ORAL | Status: DC
  Administered 2017-03-09: 300 mg via ORAL
  Filled 2017-03-09: qty 1

## 2017-03-09 MED ORDER — ROSUVASTATIN CALCIUM 10 MG PO TABS
5.0000 mg | ORAL_TABLET | Freq: Every day | ORAL | Status: DC
Start: 2017-03-09 — End: 2017-03-09
  Administered 2017-03-09: 10:00:00 5 mg via ORAL
  Filled 2017-03-09: qty 1

## 2017-03-09 MED ORDER — PANTOPRAZOLE SODIUM 40 MG PO TBEC: 40.0000 mg | DELAYED_RELEASE_TABLET | Freq: Every day | ORAL | Status: DC

## 2017-03-09 MED ORDER — CITALOPRAM HYDROBROMIDE 20 MG PO TABS
20.0000 mg | ORAL_TABLET | Freq: Every day | ORAL | Status: DC
Start: 2017-03-09 — End: 2017-03-09
  Administered 2017-03-09: 20 mg via ORAL
  Filled 2017-03-09: qty 1

## 2017-03-09 MED ORDER — SODIUM CHLORIDE 0.9 % IV SOLN
Freq: Once | INTRAVENOUS | Status: AC
  Administered 2017-03-09: 11:00:00 via INTRAVENOUS

## 2017-03-09 NOTE — Care Management (Signed)
A resident of [facility]. Physical therapy evaluation completed. Recommending Home Health Physical therapy. [facility] will provide these services. Shelbie Ammons RN MSN CCM Care Management [phone]

## 2017-03-09 NOTE — Evaluation (Signed)
Physical Therapy Evaluation Patient Details Name: Victoria Contreras MRN: [id number] DOB: 10/29/1924 Today's Date: 03/09/2017   History of Present Illness  Pt is an 83 y.o. female presenting to hospital with low blood count and fatigue/nausea about 2 months.  Pt admitted with GI bleed and s/p 1 unit PRBC's transfusion (plan for one more unit).  PMH includes CVA with L UE and L LE weakness, a-fib, chronic diastolic heart failure, R TKA.  Clinical Impression  Prior to hospital admission, pt was modified independent w/c level functional mobility and had assist for ambulation in hallway using rollator.  Pt lives at [facility].  Currently pt is SBA supine to sit and CGA with transfer bed to chair.  Deferred ambulation d/t pt fatigue post transfer.  Pt's O2 83% on room air upon PT entering room and increased to 91% after nursing notified and cleared pt to be put on 2 L O2 via nasal cannula.  Post transfer pt's O2 91-94% on 2 L O2 (nursing and CM notified of pt's O2 needs during session).  Pt would benefit from skilled PT to address noted impairments and functional limitations (see below for any additional details).  Upon hospital discharge, recommend pt discharge back to ALF with HHPT.    Follow Up Recommendations Home health PT    Equipment Recommendations   (pt already has w/c and rollator )    Recommendations for Other Services       Precautions / Restrictions Precautions Precautions: Fall Restrictions Weight Bearing Restrictions: No      Mobility  Bed Mobility Overal bed mobility: Needs Assistance Bed Mobility: Supine to Sit     Supine to sit: Supervision;HOB elevated     General bed mobility comments: increased effort and time to perform but no physical assist provided  Transfers Overall transfer level: Needs assistance Equipment used: None Transfers: Stand Pivot Transfers   Stand pivot transfers: Min guard       General transfer comment: stand step turn bed to recliner  mild increased time to perform  Ambulation/Gait             General Gait Details: Deferred d/t pt fatigue post transfer to chair  Stairs            Wheelchair Mobility    Modified Rankin (Stroke Patients Only)       Balance Overall balance assessment: Needs assistance Sitting-balance support: Feet supported;No upper extremity supported Sitting balance-Leahy Scale: Good Sitting balance - Comments: sitting reaching within BOS                                     Pertinent Vitals/Pain Pain Assessment: No/denies pain  HR 108-116 bpm during session.    Home Living Family/patient expects to be discharged to:: Assisted living               Home Equipment: Walker - 4 wheels;Cane - single point;Shower seat;Grab bars - toilet;Grab bars - tub/shower      Prior Function Level of Independence: Needs assistance   Gait / Transfers Assistance Needed: Modified independent with bed mobility and transfers to/from w/c.  Walks most days with rollator and restorative staff in hallway.  Propels self via LE's in w/c.  ADL's / Homemaking Assistance Needed: Has aide 3x/week to assist with bathing and dressing.  Comments: Eats meals at facility.     Hand Dominance  Extremity/Trunk Assessment   Upper Extremity Assessment Upper Extremity Assessment: Generalized weakness    Lower Extremity Assessment Lower Extremity Assessment: Generalized weakness       Communication   Communication: No difficulties  Cognition Arousal/Alertness: Awake/alert Behavior During Therapy: WFL for tasks assessed/performed Overall Cognitive Status: Within Functional Limits for tasks assessed                                        General Comments General comments (skin integrity, edema, etc.): Pt resting in bed upon PT arrival.  Nursing tech just cleaned pt up.  Nursing cleared pt for participation in physical therapy.  Pt agreeable to PT session.     Exercises     Assessment/Plan    PT Assessment Patient needs continued PT services  PT Problem List Decreased strength;Decreased activity tolerance;Decreased balance;Decreased mobility       PT Treatment Interventions DME instruction;Gait training;Functional mobility training;Therapeutic activities;Therapeutic exercise;Balance training;Patient/family education    PT Goals (Current goals can be found in the Care Plan section)  Acute Rehab PT Goals Patient Stated Goal: to go home PT Goal Formulation: With patient Time For Goal Achievement: 03/23/17 Potential to Achieve Goals: Good    Frequency Min 2X/week   Barriers to discharge        Co-evaluation               AM-PAC PT "6 Clicks" Daily Activity  Outcome Measure Difficulty turning over in bed (including adjusting bedclothes, sheets and blankets)?: A Little Difficulty moving from lying on back to sitting on the side of the bed? : A Lot Difficulty sitting down on and standing up from a chair with arms (e.g., wheelchair, bedside commode, etc,.)?: A Little Help needed moving to and from a bed to chair (including a wheelchair)?: A Little Help needed walking in hospital room?: A Little Help needed climbing 3-5 steps with a railing? : A Lot 6 Click Score: 16    End of Session Equipment Utilized During Treatment: Gait belt;Oxygen (2 L O2 via nasal cannula) Activity Tolerance: Patient limited by fatigue Patient left: in chair;with call bell/phone within reach;with chair alarm set Nurse Communication: Mobility status;Precautions (Pt's O2 status during session.) PT Visit Diagnosis: Muscle weakness (generalized) (M62.81);Other abnormalities of gait and mobility (R26.89)    Time: 2841-3244 PT Time Calculation (min) (ACUTE ONLY): 24 min   Charges:   PT Evaluation $PT Eval Low Complexity: 1 Procedure     PT G CodesLeitha Bleak, PT 03/09/17, 10:07 AM [phone]

## 2017-03-09 NOTE — NC FL2 (Signed)
[place] LEVEL OF CARE SCREENING TOOL     IDENTIFICATION  Patient Name: Victoria Contreras Birthdate: 02/04/1925 Sex: female Admission Date (Current Location): 03/08/2017  [place] and [place] Number:  Engineering geologist and Address:  [facility], [address] [place], [place] [address]      Provider Number: [id number]  Attending Physician Name and Address:  [name], MD  Relative Name and Phone Number:  [place] ([phone]) Daughter    Current Level of Care: Hospital Recommended Level of Care: [place] Prior Approval Number:    Date Approved/Denied:   PASRR Number: [id number]  Discharge Plan: Other (Comment) ([place] )    Current Diagnoses: Patient Active Problem List   Diagnosis Date Noted  . Anemia 03/08/2017  . Chronic diastolic heart failure ([place])   . Mood disorder ([place])   . Persistent atrial fibrillation ([place]) 08/31/2016  . Dysuria 11/06/2015  . Cystocele, grade 2 11/06/2015  . Atrophic vaginitis 11/06/2015  . CLL (chronic lymphocytic leukemia) ([place]) 04/17/2015  . 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    Orientation RESPIRATION BLADDER Height & Weight     Self, Situation, Place  O2 (2L Nasal Cannula) Incontinent Weight: 194 lb 12.8 oz (88.4 kg) Height:  5\' 9"  (175.3 cm)  BEHAVIORAL SYMPTOMS/MOOD NEUROLOGICAL BOWEL NUTRITION STATUS   (NONE)  (NONE) Incontinent Diet (DIET SOFT; Fluid consistency: Thin )  AMBULATORY STATUS COMMUNICATION OF NEEDS Skin   Limited Assist Verbally Normal                       Personal Care Assistance Level of Assistance  Bathing, Feeding, Dressing Bathing Assistance: Limited assistance Feeding assistance: Independent Dressing Assistance: Limited assistance     Functional Limitations Info  Sight, Hearing, Speech Sight Info: Adequate Hearing Info: Adequate Speech Info: Adequate    SPECIAL CARE FACTORS FREQUENCY  PT (By licensed PT)      PT Frequency: min 2x/week Outpatient PT               Contractures Contractures Info: Not present    Additional Factors Info  Code Status, Allergies, Psychotropic Code Status Info: FULL Allergies Info: Codeine, Sulfa Antibiotics, Tape Psychotropic Info: Celexa         Current Medications (03/09/2017):  This is the current hospital active medication list Current Facility-Administered Medications  Medication Dose Route Frequency Provider Last Rate Last Dose  . acetaminophen (TYLENOL) tablet 650 mg  650 mg Oral Q6H PRN [name], MD       Or  . acetaminophen (TYLENOL) suppository 650 mg  650 mg Rectal Q6H PRN [name], MD      . allopurinol (ZYLOPRIM) tablet 300 mg  300 mg Oral Daily [name], MD      . citalopram (CELEXA) tablet 20 mg  20 mg Oral Daily [name], MD   20 mg at 03/09/17 1028  . diltiazem (CARDIZEM CD) 24 hr capsule 300 mg  300 mg Oral Daily [name], MD   300 mg at 03/09/17 1036  . ondansetron (ZOFRAN) tablet 4 mg  4 mg Oral Q6H PRN [name], MD       Or  . ondansetron [facility]) injection 4 mg  4 mg Intravenous Q6H PRN [name], MD      . pantoprazole (PROTONIX) injection 40 mg  40 mg Intravenous Q12H [name], MD   40 mg at 03/09/17 1031  . polyethylene glycol (MIRALAX / GLYCOLAX) packet 17 g  17 g Oral Daily PRN [name], MD      . rosuvastatin (CRESTOR) tablet 5 mg  5 mg Oral Daily [name], MD   5 mg at 03/09/17 1029     Discharge Medications: Please see discharge summary for a list of discharge medications.  Relevant Imaging Results:  Relevant Lab Results:   Additional Information SS#: [id number]  Lind Covert, LCSW

## 2017-03-09 NOTE — Progress Notes (Signed)
Patient is medically stable for D/C back to [facility] with outpatient PT prescription in the D/C packet. Per Letta Moynahan nurse at [facility] patient can return today. Patient's daughter Manuela Schwartz will transport and she is at bedside. D/C packet complete. RN aware of above. Please reconsult if future social work needs arise. CSW signing off.   [employer], LCSW [phone]

## 2017-03-09 NOTE — Clinical Social Work Note (Signed)
Clinical Social Work Assessment  Patient Details  Name: Victoria Contreras MRN: [id number] Date of Birth: [birth year]  Date of referral:  03/09/17               Reason for consult:  Other (Comment Required) (Patient is from [facility] )                Permission sought to share information with:  Facility Art therapist granted to share information::  Yes, Verbal Permission Granted  Name::        Agency::     Relationship::     Contact Information:     Housing/Transportation Living arrangements for the past 2 months:  [place] of Information:  Patient, Facility Patient Interpreter Needed:  None Criminal Activity/Legal Involvement Pertinent to Current Situation/Hospitalization:  No - Comment as needed Significant Relationships:  Adult Children Lives with:  Facility Resident Do you feel safe going back to the place where you live?  Yes Need for family participation in patient care:  Yes (Comment)  Care giving concerns:  Patient is a resident at [facility].    Social Worker assessment / plan:  Holiday representative ([place]) received consult that patient is from [facility]. CSW contacted [facility] admissions coordinator at [facility] who confirmed that patient is from [facility] and can return. PT is recommending home health. Per Seth Bake patient will need an outpatient PT order. CSW met with patient and stated that she is agreeable to return to [facility] and her daughters will transport. FL2 complete. CSW will continue to follow and assist as needed.   Employment status:  Retired Forensic scientist:  Medicare PT Recommendations:  Home with [place] / Referral to community resources:  Other (Comment Required) (Patient will return to [facility]F. )  Patient/Family's Response to care:  Patient is agreeable to return to [facility].   Patient/Family's Understanding of and Emotional Response to Diagnosis, Current  Treatment, and Prognosis:  Patient was very pleasant and thanked CSW for visit.   Emotional Assessment Appearance:  Appears stated age Attitude/Demeanor/Rapport:    Affect (typically observed):  Accepting, Adaptable, Pleasant Orientation:  Oriented to Self, Oriented to Place, Oriented to  Time, Oriented to Situation Alcohol / Substance use:  Not Applicable Psych involvement (Current and /or in the community):  No (Comment)  Discharge Needs  Concerns to be addressed:  Discharge Planning Concerns Readmission within the last 30 days:  No Current discharge risk:  Dependent with Mobility Barriers to Discharge:  Continued Medical Work up   [employer], Veronia Beets, LCSW 03/09/2017, 3:01 PM

## 2017-03-09 NOTE — Discharge Instructions (Signed)
Check hgb in 1 week

## 2017-03-09 NOTE — NC FL2 (Signed)
[place] LEVEL OF CARE SCREENING TOOL     IDENTIFICATION  Patient Name: Victoria Contreras Birthdate: 02-05-1925 Sex: female Admission Date (Current Location): 03/08/2017  [place] and [place] Number:  Engineering geologist and Address:  [facility], [address] [place], [place] [address]      Provider Number: [id number]  Attending Physician Name and Address:  [name], MD  Relative Name and Phone Number:  Lavona [place]ound ([phone]) Daughter    Current Level of Care: Hospital Recommended Level of Care: [place] Prior Approval Number:    Date Approved/Denied:   PASRR Number: [id number]  Discharge Plan: Other (Comment) ([place] )    Current Diagnoses: Patient Active Problem List   Diagnosis Date Noted  . Anemia 03/08/2017  . Chronic diastolic heart failure ([place])   . Mood disorder ([place])   . Persistent atrial fibrillation ([place]) 08/31/2016  . Dysuria 11/06/2015  . Cystocele, grade 2 11/06/2015  . Atrophic vaginitis 11/06/2015  . CLL (chronic lymphocytic leukemia) ([place]) 04/17/2015  . 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    Orientation RESPIRATION BLADDER Height & Weight     Self, Situation, Place  Room Air  Incontinent Weight: 194 lb 12.8 oz (88.4 kg) Height:  5\' 9"  (175.3 cm)  BEHAVIORAL SYMPTOMS/MOOD NEUROLOGICAL BOWEL NUTRITION STATUS   (NONE)  (NONE) Incontinent Diet (DIET SOFT; Fluid consistency: Thin ) Low Sodium/ Heart Healthy   AMBULATORY STATUS COMMUNICATION OF NEEDS Skin   Limited Assist Verbally Normal                       Personal Care Assistance Level of Assistance  Bathing, Feeding, Dressing Bathing Assistance: Limited assistance Feeding assistance: Independent Dressing Assistance: Limited assistance     Functional Limitations Info  Sight, Hearing, Speech Sight Info: Adequate Hearing Info: Adequate Speech Info: Adequate    SPECIAL CARE FACTORS FREQUENCY  PT (By  licensed PT)     PT Frequency: min 2x/week Outpatient PT               Contractures Contractures Info: Not present    Additional Factors Info  Code Status, Allergies, Psychotropic Code Status Info: FULL Allergies Info: Codeine, Sulfa Antibiotics, Tape Psychotropic Info: Celexa        Discharge Medications: Please see discharge summary for a list of discharge medications. Current Discharge Medication List        START taking these medications   Details  pantoprazole (PROTONIX) 40 MG tablet Take 1 tablet (40 mg total) by mouth daily before breakfast. Qty: 30 tablet, Refills: 1          CONTINUE these medications which have NOT CHANGED   Details  acetaminophen (TYLENOL) 500 MG tablet Take 500 mg by mouth every 4 (four) hours as needed.    allopurinol (ZYLOPRIM) 300 MG tablet Take 300 mg by mouth daily.    alum & mag hydroxide-simeth (MAALOX/MYLANTA) 200-200-20 MG/5ML suspension Take 30 mLs by mouth every 6 (six) hours as needed for indigestion or heartburn.    amiodarone (PACERONE) 200 MG tablet Take 200 mg by mouth daily.    bismuth subsalicylate (PEPTO BISMOL) 262 MG/15ML suspension Take 30 mLs by mouth every 6 (six) hours as needed.    carbamide peroxide (DEBROX) 6.5 % OTIC solution Place 5 drops into both ears as needed.    citalopram (CELEXA) 20 MG tablet Take 20 mg by mouth daily.    diltiazem (CARDIZEM CD) 300 MG 24 hr capsule Take  1 capsule (300 mg total) by mouth daily. Qty: 30 capsule, Refills: 0    diphenhydrAMINE (BENADRYL) 25 mg capsule Take 25 mg by mouth every 6 (six) hours as needed.    estradiol (ESTRACE) 0.1 MG/GM vaginal cream Place 1 Applicatorful vaginally every Monday, Wednesday, and Friday.    furosemide (LASIX) 20 MG tablet Take 20 mg by mouth daily. Take 20mg  daily if weight is > 190lb    guaiFENesin (ROBITUSSIN) 100 MG/5ML SOLN Take 5 mLs by mouth every 4 (four) hours as needed for cough or to loosen phlegm.     hydrocortisone cream 0.5 % Apply 1 application topically 4 (four) times daily. Under nose    magnesium hydroxide (MILK OF MAGNESIA) 400 MG/5ML suspension Take 30 mLs by mouth daily as needed for mild constipation.    nystatin (NYSTATIN) powder Apply 1 Bottle topically 2 (two) times daily as needed.    ondansetron (ZOFRAN) 4 MG tablet Take 4 mg by mouth 3 (three) times daily as needed for nausea or vomiting.    potassium chloride (K-DUR) 10 MEQ tablet Take 2 tablets (20 mEq total) by mouth daily. Qty: 90 tablet, Refills: 0    rosuvastatin (CRESTOR) 5 MG tablet Take 1 tablet (5 mg total) by mouth daily. Qty: 30 tablet, Refills: 0         STOP taking these medications     naproxen sodium (ANAPROX) 220 MG tablet      rivaroxaban (XARELTO) 20 MG TABS tablet       Relevant Imaging Results: Relevant Lab Results: Additional Information SS#: [id number]  Dayn Barich, Veronia Beets, LCSW

## 2017-03-09 NOTE — Discharge Summary (Signed)
Victoria Contreras at [facility] NAME: Victoria Contreras    MR#:  [id number]  DATE OF BIRTH:  1925-07-06  DATE OF ADMISSION:  03/08/2017 ADMITTING PHYSICIAN: [name], MD  DATE OF DISCHARGE: 03/09/2017  PRIMARY CARE PHYSICIAN: [name], MD    ADMISSION DIAGNOSIS:  Low hemoglobin [D64.9] Low mean corpuscular volume (MCV) [R71.8] Anemia, unspecified type [D64.9]  DISCHARGE DIAGNOSIS:  Melena and GI bleed ----w/u as out pt if pt wishes to pursue Chronic afib -- now OFF Xarelto. Defer to PCP to resume pending GI w/u  SECONDARY DIAGNOSIS:   Past Medical History:  Diagnosis Date  . Atrial fibrillation ([place])   . Chronic diastolic heart failure ([place])   . CLL (chronic lymphoid leukemia) in relapse ([place]) 04/17/2015  . CVA (cerebral infarction)   . Frequent nosebleeds   . Gout   . Hemangiopericytoma   . HLD (hyperlipidemia)   . HTN (hypertension)   . Mood disorder [facility])     HOSPITAL COURSE:   Victoria Contreras  is a 83 y.o. 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 Patient has been having significant nausea for last several weeks along with bloating and belching she does not take anything for GERD.  1. GI bleed/melena/ -Patient came in with a bilirubin of 6.6. She is on oral Xarelto for chronic A. fib. She is Hemoccult positive. -She has been having symptoms of nausea poor appetite and bloating with belching suspect upper GI bleed/peptic ulcer disease -IV protonix 40 mg bid---Changed to oral Protonix -Status post 2 unit blood transfusion -GI consultation. Page Dr. Vicente Males -Came in with hemoglobin of 6.6----one unit of blood transfusion---  7.3---second unit of blood transfusion----8.8 -Hold Xarelto---deferred. Resuming it to  primary care pending GI workup. -Patient has microcytic anemia.  -Patient declines to stay in the hospital being July 4 weekend and GI not able to do procedure until possible Friday or Saturday. This was discussed at length with patient's family. They will make appointment with GI as outpatient if patient wants to pursue GI workup. -Dr. Alla German office  to check hemoglobin in a week  2. Chronic A. fib rate controlled -Hold Xarelto the setting of GI bleed  3. GERD -IV Protonix twice a day---change to oral ppi  4. History of diverticulosis -Patient had colonoscopy about 5 years ago that showed diverticulosis only this was told to me by patient's daughter   5. DVT prophylaxis SCD teds  Patient will be discharged back to [facility] [place] today. CONSULTS OBTAINED:    DRUG ALLERGIES:   Allergies  Allergen Reactions  . Codeine     Other reaction(s): Unknown  . Sulfa Antibiotics Other (See Comments)    Deathly sick   . Tape Itching    Other reaction(s): Itching of Skin    DISCHARGE MEDICATIONS:   Current Discharge Medication List    START taking these medications   Details  pantoprazole (PROTONIX) 40 MG tablet Take 1 tablet (40 mg total) by mouth daily before breakfast. Qty: 30 tablet, Refills: 1      CONTINUE these medications which have NOT CHANGED   Details  acetaminophen (TYLENOL) 500 MG tablet Take 500 mg by mouth every 4 (four) hours as needed.    allopurinol (ZYLOPRIM) 300 MG tablet Take 300 mg by  mouth daily.    alum & mag hydroxide-simeth (MAALOX/MYLANTA) 200-200-20 MG/5ML suspension Take 30 mLs by mouth every 6 (six) hours as needed for indigestion or heartburn.    amiodarone (PACERONE) 200 MG tablet Take 200 mg by mouth daily.    bismuth subsalicylate (PEPTO BISMOL) 262 MG/15ML suspension Take 30 mLs by mouth every 6 (six) hours as needed.    carbamide peroxide (DEBROX) 6.5 % OTIC solution Place 5 drops into both ears as needed.    citalopram (CELEXA) 20  MG tablet Take 20 mg by mouth daily.    diltiazem (CARDIZEM CD) 300 MG 24 hr capsule Take 1 capsule (300 mg total) by mouth daily. Qty: 30 capsule, Refills: 0    diphenhydrAMINE (BENADRYL) 25 mg capsule Take 25 mg by mouth every 6 (six) hours as needed.    estradiol (ESTRACE) 0.1 MG/GM vaginal cream Place 1 Applicatorful vaginally every Monday, Wednesday, and Friday.    furosemide (LASIX) 20 MG tablet Take 20 mg by mouth daily. Take 20mg  daily if weight is > 190lb    guaiFENesin (ROBITUSSIN) 100 MG/5ML SOLN Take 5 mLs by mouth every 4 (four) hours as needed for cough or to loosen phlegm.    hydrocortisone cream 0.5 % Apply 1 application topically 4 (four) times daily. Under nose    magnesium hydroxide (MILK OF MAGNESIA) 400 MG/5ML suspension Take 30 mLs by mouth daily as needed for mild constipation.    nystatin (NYSTATIN) powder Apply 1 Bottle topically 2 (two) times daily as needed.    ondansetron (ZOFRAN) 4 MG tablet Take 4 mg by mouth 3 (three) times daily as needed for nausea or vomiting.    potassium chloride (K-DUR) 10 MEQ tablet Take 2 tablets (20 mEq total) by mouth daily. Qty: 90 tablet, Refills: 0    rosuvastatin (CRESTOR) 5 MG tablet Take 1 tablet (5 mg total) by mouth daily. Qty: 30 tablet, Refills: 0      STOP taking these medications     naproxen sodium (ANAPROX) 220 MG tablet      rivaroxaban (XARELTO) 20 MG TABS tablet         If you experience worsening of your admission symptoms, develop shortness of breath, life threatening emergency, suicidal or homicidal thoughts you must seek medical attention immediately by calling 911 or calling your MD immediately  if symptoms less severe.  You Must read complete instructions/literature along with all the possible adverse reactions/side effects for all the Medicines you take and that have been prescribed to you. Take any new Medicines after you have completely understood and accept all the possible adverse  reactions/side effects.   Please note  You were cared for by a hospitalist during your hospital stay. If you have any questions about your discharge medications or the care you received while you were in the hospital after you are discharged, you can call the unit and asked to speak with the hospitalist on call if the hospitalist that took care of you is not available. Once you are discharged, your primary care physician will handle any further medical issues. Please note that NO REFILLS for any discharge medications will be authorized once you are discharged, as it is imperative that you return to your primary care physician (or establish a relationship with a primary care physician if you do not have one) for your aftercare needs so that they can reassess your need for medications and monitor your lab values. Today   SUBJECTIVE   No new complaints wants to  go back to [facility] [place] to celebrate the big cookout for July 4th !!  VITAL SIGNS:  Blood pressure 118/75, pulse 95, temperature 97.7 F (36.5 C), temperature source Oral, resp. rate 18, height 5\' 9"  (1.753 m), weight 88.4 kg (194 lb 12.8 oz), SpO2 92 %.  I/O:   Intake/Output Summary (Last 24 hours) at 03/09/17 1513 Last data filed at 03/09/17 1340  Gross per 24 hour  Intake           1222.5 ml  Output                0 ml  Net           1222.5 ml    PHYSICAL EXAMINATION:  GENERAL:  83 y.o.-year-old patient lying in the bed with no acute distress. pallor EYES: Pupils equal, round, reactive to light and accommodation. No scleral icterus. Extraocular muscles intact.  HEENT: Head atraumatic, normocephalic. Oropharynx and nasopharynx clear.  NECK:  Supple, no jugular venous distention. No thyroid enlargement, no tenderness.  LUNGS: Normal breath sounds bilaterally, no wheezing, rales,rhonchi or crepitation. No use of accessory muscles of respiration.  CARDIOVASCULAR: S1, S2 normal. No murmurs, rubs, or gallops.  ABDOMEN: Soft,  non-tender, non-distended. Bowel sounds present. No organomegaly or mass.  EXTREMITIES: No pedal edema, cyanosis, or clubbing.  NEUROLOGIC: Cranial nerves II through XII are intact. Muscle strength 5/5 in all extremities. Sensation intact. Gait not checked.  PSYCHIATRIC: The patient is alert and oriented x 3.  SKIN: No obvious rash, lesion, or ulcer.   DATA REVIEW:   CBC   Recent Labs Lab 03/09/17 0519 03/09/17 1420  WBC 9.7  --   HGB 7.3* 8.8*  HCT 24.3*  --   PLT 238  --     Chemistries   Recent Labs Lab 03/08/17 1610  NA 140  K 4.1  CL 105  CO2 25  GLUCOSE 104*  BUN 16  CREATININE 1.08*  CALCIUM 8.8*    Microbiology Results   Recent Results (from the past 240 hour(s))  MRSA PCR Screening     Status: None   Collection Time: 03/08/17 10:46 PM  Result Value Ref Range Status   MRSA by PCR NEGATIVE NEGATIVE Final    Comment:        The GeneXpert MRSA Assay (FDA approved for NASAL specimens only), is one component of a comprehensive MRSA colonization surveillance program. It is not intended to diagnose MRSA infection nor to guide or monitor treatment for MRSA infections.     RADIOLOGY:  No results found.   Management plans discussed with the patient, family and they are in agreement.  CODE STATUS:     Code Status Orders        Start     Ordered   03/08/17 1817  Full code  Continuous     03/08/17 1816    Code Status History    Date Active Date Inactive Code Status Order ID Comments User Context   09/01/2016  3:45 AM 09/07/2016  8:45 PM Full Code [id number]  Hugelmeyer, Ubaldo Glassing, DO Inpatient    Advance Directive Documentation     Most Recent Value  Type of Advance Directive  Healthcare Power of [place], Living will  Pre-existing out of facility DNR order (yellow form or pink MOST form)  -  "MOST" Form in Place?  -      TOTAL TIME TAKING CARE OF THIS PATIENT: 40 minutes.    Owen Pagnotta M.D on 03/09/2017 at 3:13 PM  Between 7am to 6pm -  Pager - [phone] After 6pm go to [email or web address] - password [email or web address]  Office  [phone]  CC: Primary care physician; [name], MD

## 2017-03-09 NOTE — Progress Notes (Signed)
Family Meeting Note  Advance Directiveyes  Today a meeting took place with the yes   The following clinical team members were present during this meeting:pt and dter The following were discussed:Patient's diagnosis: , Patient's progosis: Patient is being admitted for evaluation of microcytic anemia with heme-positive stools or in the lumen was 6.6 she is going to be transfused 1 unit of blood transfusion.  She is not actively bleeding at present.  Hemodynamically otherwise stable at present.   CODE STATUS addressed. Patient is a full code.  Time spent during discussion 16 mins Zeyad Delaguila, MD

## 2017-03-10 LAB — TYPE AND SCREEN
ABO/RH(D): O POS
Antibody Screen: NEGATIVE
Unit division: 0
Unit division: 0

## 2017-03-10 LAB — BPAM RBC
Blood Product Expiration Date: 201807192359
Blood Product Expiration Date: 201807262359
ISSUE DATE / TIME: 201807041010
ISSUE DATE / TIME: 201807032037
UNIT TYPE AND RH: 5100
Unit Type and Rh: 5100

## 2017-03-15 ENCOUNTER — Encounter: Payer: Self-pay | Admitting: Internal Medicine

## 2017-03-15 DIAGNOSIS — I482 Chronic atrial fibrillation: Secondary | ICD-10-CM | POA: Diagnosis not present

## 2017-03-16 ENCOUNTER — Non-Acute Institutional Stay: Payer: Medicare Other | Admitting: Internal Medicine

## 2017-03-16 VITALS — BP 120/90 | HR 98 | Resp 20

## 2017-03-16 DIAGNOSIS — D649 Anemia, unspecified: Secondary | ICD-10-CM | POA: Diagnosis not present

## 2017-03-16 DIAGNOSIS — K254 Chronic or unspecified gastric ulcer with hemorrhage: Secondary | ICD-10-CM | POA: Diagnosis not present

## 2017-03-17 ENCOUNTER — Encounter: Payer: Self-pay | Admitting: Internal Medicine

## 2017-03-17 NOTE — Patient Instructions (Signed)
Anemia, Nonspecific Anemia is a condition in which the concentration of red blood cells or hemoglobin in the blood is below normal. Hemoglobin is a substance in red blood cells that carries oxygen to the tissues of the body. Anemia results in not enough oxygen reaching these tissues. What are the causes? Common causes of anemia include:  Excessive bleeding. Bleeding may be internal or external. This includes excessive bleeding from periods (in women) or from the intestine.  Poor nutrition.  Chronic kidney, thyroid, and liver disease.  Bone marrow disorders that decrease red blood cell production.  Cancer and treatments for cancer.  HIV, AIDS, and their treatments.  Spleen problems that increase red blood cell destruction.  Blood disorders.  Excess destruction of red blood cells due to infection, medicines, and autoimmune disorders. What are the signs or symptoms?  Minor weakness.  Dizziness.  Headache.  Palpitations.  Shortness of breath, especially with exercise.  Paleness.  Cold sensitivity.  Indigestion.  Nausea.  Difficulty sleeping.  Difficulty concentrating. Symptoms may occur suddenly or they may develop slowly. How is this diagnosed? Additional blood tests are often needed. These help your health care provider determine the best treatment. Your health care provider will check your stool for blood and look for other causes of blood loss. How is this treated? Treatment varies depending on the cause of the anemia. Treatment can include:  Supplements of iron, vitamin B12, or folic acid.  Hormone medicines.  A blood transfusion. This may be needed if blood loss is severe.  Hospitalization. This may be needed if there is significant continual blood loss.  Dietary changes.  Spleen removal. Follow these instructions at home: Keep all follow-up appointments. It often takes many weeks to correct anemia, and having your health care provider check on your  condition and your response to treatment is very important. Get help right away if:  You develop extreme weakness, shortness of breath, or chest pain.  You become dizzy or have trouble concentrating.  You develop heavy vaginal bleeding.  You develop a rash.  You have bloody or black, tarry stools.  You faint.  You vomit up blood.  You vomit repeatedly.  You have abdominal pain.  You have a fever or persistent symptoms for more than 2-3 days.  You have a fever and your symptoms suddenly get worse.  You are dehydrated. This information is not intended to replace advice given to you by your health care provider. Make sure you discuss any questions you have with your health care provider. Document Released: 09/30/2004 Document Revised: 02/04/2016 Document Reviewed: 02/16/2013 Elsevier Interactive Patient Education  2017 [employer]  

## 2017-03-17 NOTE — Progress Notes (Signed)
Subjective:    Patient ID: Victoria Contreras, female    DOB: October 21, 1924, 83 y.o.   MRN: [id number]  HPI  Asked to see pt in ALF for Hospital Follow up. Went to ER with c/o low H/H, nausea and belching Her Xarelto was stopped She was on IV Protonix, eventually transitioned to PO She was transfused 2 units PRBC She was referred to GI for endoscopy and colonoscopy, because this could not be done during her admission Her repeat blood counts from yesterday were marginally lower than her discharge H/H She reports mild weakness but denies shortness of breath or syncope.  Review of Systems      Past Medical History:  Diagnosis Date  . Atrial fibrillation ([place])   . Chronic diastolic heart failure ([place])   . CLL (chronic lymphoid leukemia) in relapse ([place]) 04/17/2015  . CVA (cerebral infarction)   . Frequent nosebleeds   . Gout   . Hemangiopericytoma   . HLD (hyperlipidemia)   . HTN (hypertension)   . Mood disorder [facility])     Current Outpatient Prescriptions  Medication Sig Dispense Refill  . acetaminophen (TYLENOL) 500 MG tablet Take 500 mg by mouth every 4 (four) hours as needed.    Marland Kitchen allopurinol (ZYLOPRIM) 300 MG tablet Take 300 mg by mouth daily.    Marland Kitchen alum & mag hydroxide-simeth (MAALOX/MYLANTA) 200-200-20 MG/5ML suspension Take 30 mLs by mouth every 6 (six) hours as needed for indigestion or heartburn.    Marland Kitchen amiodarone (PACERONE) 200 MG tablet Take 200 mg by mouth daily.    Marland Kitchen bismuth subsalicylate (PEPTO BISMOL) 262 MG/15ML suspension Take 30 mLs by mouth every 6 (six) hours as needed.    . carbamide peroxide (DEBROX) 6.5 % OTIC solution Place 5 drops into both ears as needed.    . citalopram (CELEXA) 20 MG tablet Take 20 mg by mouth daily.    Marland Kitchen diltiazem (CARDIZEM CD) 300 MG 24 hr capsule Take 1 capsule (300 mg total) by mouth daily. 30 capsule 0  . diphenhydrAMINE (BENADRYL) 25 mg capsule Take 25 mg by mouth every 6 (six) hours as needed.    Marland Kitchen estradiol (ESTRACE) 0.1 MG/GM vaginal  cream Place 1 Applicatorful vaginally every Monday, Wednesday, and Friday.    . furosemide (LASIX) 20 MG tablet Take 20 mg by mouth daily. Take 20mg  daily if weight is > 190lb    . guaiFENesin (ROBITUSSIN) 100 MG/5ML SOLN Take 5 mLs by mouth every 4 (four) hours as needed for cough or to loosen phlegm.    . hydrocortisone cream 0.5 % Apply 1 application topically 4 (four) times daily. Under nose    . magnesium hydroxide (MILK OF MAGNESIA) 400 MG/5ML suspension Take 30 mLs by mouth daily as needed for mild constipation.    Marland Kitchen nystatin (NYSTATIN) powder Apply 1 Bottle topically 2 (two) times daily as needed.    . ondansetron (ZOFRAN) 4 MG tablet Take 4 mg by mouth 3 (three) times daily as needed for nausea or vomiting.    . pantoprazole (PROTONIX) 40 MG tablet Take 1 tablet (40 mg total) by mouth daily before breakfast. 30 tablet 1  . potassium chloride (K-DUR) 10 MEQ tablet Take 2 tablets (20 mEq total) by mouth daily. (Patient taking differently: Take 10 mEq by mouth daily. ) 90 tablet 0  . rosuvastatin (CRESTOR) 5 MG tablet Take 1 tablet (5 mg total) by mouth daily. 30 tablet 0   No current facility-administered medications for this visit.  Allergies  Allergen Reactions  . Codeine     Other reaction(s): Unknown  . Sulfa Antibiotics Other (See Comments)    Deathly sick   . Tape Itching    Other reaction(s): Itching of Skin    Family History  Problem Relation Age of Onset  . Other Father        "brain hemorrhage"  . Melanoma Daughter 7  . Alcoholism Sister   . Alzheimer's disease Brother   . Alzheimer's disease Sister   . Cancer Sister   . Kidney disease Neg Hx   . Bladder Cancer Neg Hx     Social History   Social History  . Marital status: Widowed    Spouse name: N/A  . Number of children: 5  . Years of education: N/A   Occupational History  . Secretary     Retired   Social History Main Topics  . Smoking status: Former Smoker    Types: Cigarettes  . Smokeless  tobacco: Never Used     Comment: quit 1990  . Alcohol use No  . Drug use: No  . Sexual activity: Not on file   Other Topics Concern  . Not on file   Social History Narrative   Widowed 16-Dec-2003. 5 children-- 1 son died   12 daughters/1 son living      Has living will   Son Joe/daughter Trish--health care POAs   Would accept resuscitation.   No tube feeds if cognitively unaware     Constitutional: Denies fever, malaise, fatigue, headache or abrupt weight changes.  Respiratory: Denies difficulty breathing, shortness of breath, cough or sputum production.   Cardiovascular: Denies chest pain, chest tightness, palpitations or swelling in the hands or feet.  Musculoskeletal: Pt reports weakness. Denies decrease in range of motion, difficulty with gait, muscle pain or joint pain and swelling.  Neurological: Denies dizziness, difficulty with memory, difficulty with speech or problems with balance and coordination.    No other specific complaints in a complete review of systems (except as listed in HPI above).  Objective:   Physical Exam   BP 120/90   Pulse 98   Resp 20  Wt Readings from Last 3 Encounters:  03/08/17 194 lb 12.8 oz (88.4 kg)  03/03/17 196 lb (88.9 kg)  11/25/16 198 lb (89.8 kg)    General: Appears her stated age, chronically ill appearing, in NAD  Cardiovascular: Normal rate and rhythm. S1,S2 noted.  No murmur, rubs or gallops noted.  Pulmonary/Chest: Normal effort and positive vesicular breath sounds. No respiratory distress. No wheezes, rales or ronchi noted.   BMET    Component Value Date/Time   NA 140 03/08/2017 1610   NA 141 12/29/2013 0437   K 4.1 03/08/2017 1610   K 3.7 12/29/2013 0437   CL 105 03/08/2017 1610   CL 109 (H) 12/29/2013 0437   CO2 25 03/08/2017 1610   CO2 27 12/29/2013 0437   GLUCOSE 104 (H) 03/08/2017 1610   GLUCOSE 110 (H) 12/29/2013 0437   BUN 16 03/08/2017 1610   BUN 12 12/29/2013 0437   CREATININE 1.08 (H) 03/08/2017 1610    CREATININE 0.84 10/11/2014 1117   CALCIUM 8.8 (L) 03/08/2017 1610   CALCIUM 9.0 12/29/2013 0437   GFRNONAA 44 (L) 03/08/2017 1610   GFRNONAA >60 10/11/2014 1117   GFRNONAA >60 12/29/2013 0437   GFRAA 51 (L) 03/08/2017 1610   GFRAA >60 10/11/2014 1117   GFRAA >60 12/29/2013 0437    Lipid Panel  Component Value Date/Time   CHOL 90 09/01/2016 0807   CHOL 104 12/29/2013 0437   TRIG 89 09/01/2016 0807   TRIG 160 12/29/2013 0437   HDL 30 (L) 09/01/2016 0807   HDL 29 (L) 12/29/2013 0437   CHOLHDL 3.0 09/01/2016 0807   VLDL 18 09/01/2016 0807   VLDL 32 12/29/2013 0437   LDLCALC 42 09/01/2016 0807   LDLCALC 43 12/29/2013 0437    CBC    Component Value Date/Time   WBC 9.7 03/09/2017 0519   RBC 3.82 03/09/2017 0519   HGB 8.8 (L) 03/09/2017 1420   HGB 13.1 10/11/2014 1117   HCT 24.3 (L) 03/09/2017 0519   HCT 39.7 10/11/2014 1117   PLT 238 03/09/2017 0519   PLT 190 10/11/2014 1117   MCV 63.7 (L) 03/09/2017 0519   MCV 89 10/11/2014 1117   MCH 19.0 (L) 03/09/2017 0519   MCHC 29.9 (L) 03/09/2017 0519   RDW 23.2 (H) 03/09/2017 0519   RDW 15.1 (H) 10/11/2014 1117   LYMPHSABS 2.4 10/30/2016 1548   LYMPHSABS 1.8 10/11/2014 1117   MONOABS 0.7 10/30/2016 1548   MONOABS 0.5 10/11/2014 1117   EOSABS 0.0 10/30/2016 1548   EOSABS 0.2 10/11/2014 1117   BASOSABS 0.0 10/30/2016 1548   BASOSABS 0.1 10/11/2014 1117    Hgb A1C No results found for: HGBA1C         Assessment & Plan:   Hospital Followup of for Anemia secondary to GI Bleed:  Family does not want invasive procedures if they can be avoided Will monitor for s/s of bleeding Continue Protonix Will repeat CBC in 1 week  Will reassess once blood counts are back Webb Silversmith, NP

## 2017-03-21 DIAGNOSIS — K922 Gastrointestinal hemorrhage, unspecified: Secondary | ICD-10-CM | POA: Diagnosis not present

## 2017-03-21 DIAGNOSIS — I482 Chronic atrial fibrillation: Secondary | ICD-10-CM | POA: Diagnosis not present

## 2017-03-28 DIAGNOSIS — R262 Difficulty in walking, not elsewhere classified: Secondary | ICD-10-CM | POA: Diagnosis not present

## 2017-03-30 DIAGNOSIS — R262 Difficulty in walking, not elsewhere classified: Secondary | ICD-10-CM | POA: Diagnosis not present

## 2017-04-01 DIAGNOSIS — R262 Difficulty in walking, not elsewhere classified: Secondary | ICD-10-CM | POA: Diagnosis not present

## 2017-04-04 DIAGNOSIS — R262 Difficulty in walking, not elsewhere classified: Secondary | ICD-10-CM | POA: Diagnosis not present

## 2017-04-04 DIAGNOSIS — K922 Gastrointestinal hemorrhage, unspecified: Secondary | ICD-10-CM | POA: Diagnosis not present

## 2017-04-06 DIAGNOSIS — R262 Difficulty in walking, not elsewhere classified: Secondary | ICD-10-CM | POA: Diagnosis not present

## 2017-04-07 DIAGNOSIS — E7801 Familial hypercholesterolemia: Secondary | ICD-10-CM | POA: Diagnosis not present

## 2017-04-07 DIAGNOSIS — I10 Essential (primary) hypertension: Secondary | ICD-10-CM | POA: Diagnosis not present

## 2017-04-07 DIAGNOSIS — I481 Persistent atrial fibrillation: Secondary | ICD-10-CM | POA: Diagnosis not present

## 2017-04-07 DIAGNOSIS — R262 Difficulty in walking, not elsewhere classified: Secondary | ICD-10-CM | POA: Diagnosis not present

## 2017-04-14 DIAGNOSIS — R262 Difficulty in walking, not elsewhere classified: Secondary | ICD-10-CM | POA: Diagnosis not present

## 2017-04-19 DIAGNOSIS — R262 Difficulty in walking, not elsewhere classified: Secondary | ICD-10-CM | POA: Diagnosis not present

## 2017-04-21 DIAGNOSIS — R262 Difficulty in walking, not elsewhere classified: Secondary | ICD-10-CM | POA: Diagnosis not present

## 2017-04-29 ENCOUNTER — Non-Acute Institutional Stay: Payer: Medicare Other | Admitting: Internal Medicine

## 2017-04-29 ENCOUNTER — Encounter: Payer: Self-pay | Admitting: Internal Medicine

## 2017-04-29 VITALS — BP 114/70 | HR 68

## 2017-04-29 DIAGNOSIS — M25552 Pain in left hip: Secondary | ICD-10-CM | POA: Diagnosis not present

## 2017-04-29 DIAGNOSIS — M25551 Pain in right hip: Secondary | ICD-10-CM | POA: Diagnosis not present

## 2017-04-29 DIAGNOSIS — R600 Localized edema: Secondary | ICD-10-CM

## 2017-04-29 NOTE — Patient Instructions (Signed)

## 2017-04-29 NOTE — Progress Notes (Signed)
Subjective:    Patient ID: Victoria Contreras, female    DOB: 24-Mar-1925, 83 y.o.   MRN: [id number]  HPI  Asked to see resident in ALF 304. Resident c/o bilateral hip pain. This has been going on for at least a month. She describes the pain as stiffness. It seems worse first thing in the morning and better throughout the day. She walks limited distances with walker, mostly self propels in the wheelchair. Resident feels like this is related to sitting and laying down for too long of time. She denies numbness or tingling in her legs. Resident also c/o swelling around her eyes. This started 3 weeks ago. It is worse first thing in the morning, better throughout the day. The swelling does not affect her vision. She is on Lasix daily. She does sleep up on a wedge at night.  Review of Systems      Past Medical History:  Diagnosis Date  . Atrial fibrillation ([place])   . Chronic diastolic heart failure ([place])   . CLL (chronic lymphoid leukemia) in relapse ([place]) 04/17/2015  . CVA (cerebral infarction)   . Frequent nosebleeds   . Gout   . Hemangiopericytoma   . HLD (hyperlipidemia)   . HTN (hypertension)   . Mood disorder [facility])     Current Outpatient Prescriptions  Medication Sig Dispense Refill  . acetaminophen (TYLENOL) 500 MG tablet Take 500 mg by mouth every 4 (four) hours as needed.    Marland Kitchen allopurinol (ZYLOPRIM) 300 MG tablet Take 300 mg by mouth daily.    Marland Kitchen alum & mag hydroxide-simeth (MAALOX/MYLANTA) 200-200-20 MG/5ML suspension Take 30 mLs by mouth every 6 (six) hours as needed for indigestion or heartburn.    Marland Kitchen amiodarone (PACERONE) 200 MG tablet Take 200 mg by mouth daily.    Marland Kitchen bismuth subsalicylate (PEPTO BISMOL) 262 MG/15ML suspension Take 30 mLs by mouth every 6 (six) hours as needed.    . carbamide peroxide (DEBROX) 6.5 % OTIC solution Place 5 drops into both ears as needed.    . citalopram (CELEXA) 20 MG tablet Take 20 mg by mouth daily.    Marland Kitchen diltiazem (CARDIZEM CD) 300 MG 24 hr  capsule Take 1 capsule (300 mg total) by mouth daily. 30 capsule 0  . diphenhydrAMINE (BENADRYL) 25 mg capsule Take 25 mg by mouth every 6 (six) hours as needed.    Marland Kitchen estradiol (ESTRACE) 0.1 MG/GM vaginal cream Place 1 Applicatorful vaginally every Monday, Wednesday, and Friday.    . furosemide (LASIX) 20 MG tablet Take 20 mg by mouth daily. Take 20mg  daily if weight is > 190lb    . guaiFENesin (ROBITUSSIN) 100 MG/5ML SOLN Take 5 mLs by mouth every 4 (four) hours as needed for cough or to loosen phlegm.    . hydrocortisone cream 0.5 % Apply 1 application topically 4 (four) times daily. Under nose    . magnesium hydroxide (MILK OF MAGNESIA) 400 MG/5ML suspension Take 30 mLs by mouth daily as needed for mild constipation.    Marland Kitchen nystatin (NYSTATIN) powder Apply 1 Bottle topically 2 (two) times daily as needed.    . ondansetron (ZOFRAN) 4 MG tablet Take 4 mg by mouth 3 (three) times daily as needed for nausea or vomiting.    . pantoprazole (PROTONIX) 40 MG tablet Take 1 tablet (40 mg total) by mouth daily before breakfast. 30 tablet 1  . potassium chloride (K-DUR) 10 MEQ tablet Take 2 tablets (20 mEq total) by mouth daily. (Patient taking differently: Take  10 mEq by mouth daily. ) 90 tablet 0  . rosuvastatin (CRESTOR) 5 MG tablet Take 1 tablet (5 mg total) by mouth daily. 30 tablet 0   No current facility-administered medications for this visit.     Allergies  Allergen Reactions  . Codeine     Other reaction(s): Unknown  . Sulfa Antibiotics Other (See Comments)    Deathly sick   . Tape Itching    Other reaction(s): Itching of Skin    Family History  Problem Relation Age of Onset  . Other Father        "brain hemorrhage"  . Melanoma Daughter 9  . Alcoholism Sister   . Alzheimer's disease Brother   . Alzheimer's disease Sister   . Cancer Sister   . Kidney disease Neg Hx   . Bladder Cancer Neg Hx     Social History   Social History  . Marital status: Widowed    Spouse name: N/A    . Number of children: 5  . Years of education: N/A   Occupational History  . Secretary     Retired   Social History Main Topics  . Smoking status: Former Smoker    Types: Cigarettes  . Smokeless tobacco: Never Used     Comment: quit 1990  . Alcohol use No  . Drug use: No  . Sexual activity: Not on file   Other Topics Concern  . Not on file   Social History Narrative   Widowed 25-Dec-2003. 5 children-- 1 son died   73 daughters/1 son living      Has living will   Son Joe/daughter Trish--health care POAs   Would accept resuscitation.   No tube feeds if cognitively unaware     Constitutional: Denies fever, malaise, fatigue, headache or abrupt weight changes.  HEENT: Pt reports periorbital edema. Denies eye pain, eye redness, ear pain, ringing in the ears, wax buildup, runny nose, nasal congestion, bloody nose, or sore throat. Musculoskeletal: Pt reports bilateral hip pain. Denies muscle pain or joint swelling.    No other specific complaints in a complete review of systems (except as listed in HPI above).  Objective:   Physical Exam  BP 114/70   Pulse 68   Wt Readings from Last 3 Encounters:  03/08/17 194 lb 12.8 oz (88.4 kg)  03/03/17 196 lb (88.9 kg)  11/25/16 198 lb (89.8 kg)    General: Appears her stated age, in NAD. HEENT:  Eyes: Bilateral periorbital edema noted. Musculoskeletal: Normal flexion and extension of bilateral hips. Normal abduction, mild pain with adduction bilaterally. Normal external rotation, pain with internal rotation bilaterally. No pain with palpation of either trochanteric bursa.    BMET    Component Value Date/Time   NA 140 03/08/2017 1610   NA 141 12/29/2013 0437   K 4.1 03/08/2017 1610   K 3.7 12/29/2013 0437   CL 105 03/08/2017 1610   CL 109 (H) 12/29/2013 0437   CO2 25 03/08/2017 1610   CO2 27 12/29/2013 0437   GLUCOSE 104 (H) 03/08/2017 1610   GLUCOSE 110 (H) 12/29/2013 0437   BUN 16 03/08/2017 1610   BUN 12 12/29/2013 0437    CREATININE 1.08 (H) 03/08/2017 1610   CREATININE 0.84 10/11/2014 1117   CALCIUM 8.8 (L) 03/08/2017 1610   CALCIUM 9.0 12/29/2013 0437   GFRNONAA 44 (L) 03/08/2017 1610   GFRNONAA >60 10/11/2014 1117   GFRNONAA >60 12/29/2013 0437   GFRAA 51 (L) 03/08/2017 1610   GFRAA >60  10/11/2014 1117   GFRAA >60 12/29/2013 0437    Lipid Panel     Component Value Date/Time   CHOL 90 09/01/2016 0807   CHOL 104 12/29/2013 0437   TRIG 89 09/01/2016 0807   TRIG 160 12/29/2013 0437   HDL 30 (L) 09/01/2016 0807   HDL 29 (L) 12/29/2013 0437   CHOLHDL 3.0 09/01/2016 0807   VLDL 18 09/01/2016 0807   VLDL 32 12/29/2013 0437   LDLCALC 42 09/01/2016 0807   LDLCALC 43 12/29/2013 0437    CBC    Component Value Date/Time   WBC 9.7 03/09/2017 0519   RBC 3.82 03/09/2017 0519   HGB 8.8 (L) 03/09/2017 1420   HGB 13.1 10/11/2014 1117   HCT 24.3 (L) 03/09/2017 0519   HCT 39.7 10/11/2014 1117   PLT 238 03/09/2017 0519   PLT 190 10/11/2014 1117   MCV 63.7 (L) 03/09/2017 0519   MCV 89 10/11/2014 1117   MCH 19.0 (L) 03/09/2017 0519   MCHC 29.9 (L) 03/09/2017 0519   RDW 23.2 (H) 03/09/2017 0519   RDW 15.1 (H) 10/11/2014 1117   LYMPHSABS 2.4 10/30/2016 1548   LYMPHSABS 1.8 10/11/2014 1117   MONOABS 0.7 10/30/2016 1548   MONOABS 0.5 10/11/2014 1117   EOSABS 0.0 10/30/2016 1548   EOSABS 0.2 10/11/2014 1117   BASOSABS 0.0 10/30/2016 1548   BASOSABS 0.1 10/11/2014 1117    Hgb A1C No results found for: HGBA1C          Assessment & Plan:   Periorbital Edema:  No need to increase Lasix for this Monitor  Bilateral Hip Pain:  OA Schedule Tylenol Arthritis 650 mg every 12 hours Continue Tylenol 500 mg 1 tab PO Q8H prn PT eval  Return precautions discussed Webb Silversmith, NP

## 2017-04-30 ENCOUNTER — Encounter: Payer: Self-pay | Admitting: Emergency Medicine

## 2017-04-30 ENCOUNTER — Emergency Department: Payer: Medicare Other

## 2017-04-30 ENCOUNTER — Inpatient Hospital Stay
Admission: AD | Admit: 2017-04-30 | Discharge: 2017-05-10 | DRG: 291 | Disposition: A | Discharge status: Skilled Nursing Facility | Payer: Medicare Other | Attending: Internal Medicine | Admitting: Internal Medicine

## 2017-04-30 DIAGNOSIS — Z8673 Personal history of transient ischemic attack (TIA), and cerebral infarction without residual deficits: Secondary | ICD-10-CM

## 2017-04-30 DIAGNOSIS — E876 Hypokalemia: Secondary | ICD-10-CM | POA: Diagnosis present

## 2017-04-30 DIAGNOSIS — I5023 Acute on chronic systolic (congestive) heart failure: Secondary | ICD-10-CM | POA: Diagnosis not present

## 2017-04-30 DIAGNOSIS — D649 Anemia, unspecified: Secondary | ICD-10-CM | POA: Diagnosis not present

## 2017-04-30 DIAGNOSIS — J90 Pleural effusion, not elsewhere classified: Secondary | ICD-10-CM | POA: Diagnosis present

## 2017-04-30 DIAGNOSIS — C9112 Chronic lymphocytic leukemia of B-cell type in relapse: Secondary | ICD-10-CM | POA: Diagnosis present

## 2017-04-30 DIAGNOSIS — M6281 Muscle weakness (generalized): Secondary | ICD-10-CM | POA: Diagnosis not present

## 2017-04-30 DIAGNOSIS — I110 Hypertensive heart disease with heart failure: Principal | ICD-10-CM | POA: Diagnosis present

## 2017-04-30 DIAGNOSIS — J9600 Acute respiratory failure, unspecified whether with hypoxia or hypercapnia: Secondary | ICD-10-CM | POA: Diagnosis present

## 2017-04-30 DIAGNOSIS — N3000 Acute cystitis without hematuria: Secondary | ICD-10-CM | POA: Diagnosis present

## 2017-04-30 DIAGNOSIS — Z7401 Bed confinement status: Secondary | ICD-10-CM | POA: Diagnosis not present

## 2017-04-30 DIAGNOSIS — D509 Iron deficiency anemia, unspecified: Secondary | ICD-10-CM | POA: Diagnosis present

## 2017-04-30 DIAGNOSIS — I480 Paroxysmal atrial fibrillation: Secondary | ICD-10-CM | POA: Diagnosis not present

## 2017-04-30 DIAGNOSIS — J9601 Acute respiratory failure with hypoxia: Secondary | ICD-10-CM | POA: Diagnosis not present

## 2017-04-30 DIAGNOSIS — Z882 Allergy status to sulfonamides status: Secondary | ICD-10-CM | POA: Diagnosis not present

## 2017-04-30 DIAGNOSIS — I4891 Unspecified atrial fibrillation: Secondary | ICD-10-CM | POA: Diagnosis not present

## 2017-04-30 DIAGNOSIS — J9621 Acute and chronic respiratory failure with hypoxia: Secondary | ICD-10-CM | POA: Diagnosis not present

## 2017-04-30 DIAGNOSIS — C9110 Chronic lymphocytic leukemia of B-cell type not having achieved remission: Secondary | ICD-10-CM | POA: Diagnosis not present

## 2017-04-30 DIAGNOSIS — I509 Heart failure, unspecified: Secondary | ICD-10-CM | POA: Diagnosis not present

## 2017-04-30 DIAGNOSIS — Z885 Allergy status to narcotic agent status: Secondary | ICD-10-CM | POA: Diagnosis not present

## 2017-04-30 DIAGNOSIS — R0602 Shortness of breath: Secondary | ICD-10-CM

## 2017-04-30 DIAGNOSIS — F329 Major depressive disorder, single episode, unspecified: Secondary | ICD-10-CM | POA: Diagnosis present

## 2017-04-30 DIAGNOSIS — Z79899 Other long term (current) drug therapy: Secondary | ICD-10-CM

## 2017-04-30 DIAGNOSIS — Z87891 Personal history of nicotine dependence: Secondary | ICD-10-CM | POA: Diagnosis not present

## 2017-04-30 DIAGNOSIS — K219 Gastro-esophageal reflux disease without esophagitis: Secondary | ICD-10-CM | POA: Diagnosis present

## 2017-04-30 DIAGNOSIS — M109 Gout, unspecified: Secondary | ICD-10-CM | POA: Diagnosis present

## 2017-04-30 DIAGNOSIS — I482 Chronic atrial fibrillation: Secondary | ICD-10-CM | POA: Diagnosis present

## 2017-04-30 DIAGNOSIS — I10 Essential (primary) hypertension: Secondary | ICD-10-CM | POA: Diagnosis not present

## 2017-04-30 DIAGNOSIS — Z7901 Long term (current) use of anticoagulants: Secondary | ICD-10-CM

## 2017-04-30 DIAGNOSIS — R531 Weakness: Secondary | ICD-10-CM | POA: Diagnosis not present

## 2017-04-30 DIAGNOSIS — J449 Chronic obstructive pulmonary disease, unspecified: Secondary | ICD-10-CM | POA: Diagnosis present

## 2017-04-30 DIAGNOSIS — F39 Unspecified mood [affective] disorder: Secondary | ICD-10-CM | POA: Diagnosis not present

## 2017-04-30 DIAGNOSIS — Z66 Do not resuscitate: Secondary | ICD-10-CM | POA: Diagnosis present

## 2017-04-30 DIAGNOSIS — Z741 Need for assistance with personal care: Secondary | ICD-10-CM | POA: Diagnosis not present

## 2017-04-30 DIAGNOSIS — A419 Sepsis, unspecified organism: Secondary | ICD-10-CM | POA: Diagnosis not present

## 2017-04-30 DIAGNOSIS — Z48813 Encounter for surgical aftercare following surgery on the respiratory system: Secondary | ICD-10-CM | POA: Diagnosis not present

## 2017-04-30 DIAGNOSIS — Z9889 Other specified postprocedural states: Secondary | ICD-10-CM

## 2017-04-30 DIAGNOSIS — T502X5A Adverse effect of carbonic-anhydrase inhibitors, benzothiadiazides and other diuretics, initial encounter: Secondary | ICD-10-CM | POA: Diagnosis present

## 2017-04-30 DIAGNOSIS — E782 Mixed hyperlipidemia: Secondary | ICD-10-CM | POA: Diagnosis present

## 2017-04-30 DIAGNOSIS — R2681 Unsteadiness on feet: Secondary | ICD-10-CM | POA: Diagnosis not present

## 2017-04-30 DIAGNOSIS — R0600 Dyspnea, unspecified: Secondary | ICD-10-CM | POA: Diagnosis not present

## 2017-04-30 DIAGNOSIS — I5040 Unspecified combined systolic (congestive) and diastolic (congestive) heart failure: Secondary | ICD-10-CM | POA: Diagnosis not present

## 2017-04-30 DIAGNOSIS — I5033 Acute on chronic diastolic (congestive) heart failure: Secondary | ICD-10-CM | POA: Diagnosis present

## 2017-04-30 LAB — COMPREHENSIVE METABOLIC PANEL
ALK PHOS: 121 U/L (ref 38–126)
ALT: 19 U/L (ref 14–54)
AST: 27 U/L (ref 15–41)
Albumin: 3.3 g/dL — ABNORMAL LOW (ref 3.5–5.0)
Anion gap: 8 (ref 5–15)
BUN: 15 mg/dL (ref 6–20)
CALCIUM: 8.6 mg/dL — AB (ref 8.9–10.3)
CHLORIDE: 103 mmol/L (ref 101–111)
CO2: 28 mmol/L (ref 22–32)
CREATININE: 0.81 mg/dL (ref 0.44–1.00)
GFR calc non Af Amer: >60 mL/min (ref >60)
GLUCOSE: 110 mg/dL — AB (ref 65–99)
Potassium: 3.2 mmol/L — ABNORMAL LOW (ref 3.5–5.1)
SODIUM: 139 mmol/L (ref 135–145)
Total Bilirubin: 0.7 mg/dL (ref 0.3–1.2)
Total Protein: 6.3 g/dL — ABNORMAL LOW (ref 6.5–8.1)

## 2017-04-30 LAB — CBC WITH DIFFERENTIAL/PLATELET
Basophils Absolute: 0 10*3/uL (ref 0–0.1)
Basophils Relative: 0 %
EOS ABS: 0.1 10*3/uL (ref 0–0.7)
EOS PCT: 1 %
HCT: 29.7 % — ABNORMAL LOW (ref 35.0–47.0)
Hemoglobin: 9.2 g/dL — ABNORMAL LOW (ref 12.0–16.0)
LYMPHS ABS: 2 10*3/uL (ref 1.0–3.6)
LYMPHS PCT: 22 %
MCH: 19.9 pg — AB (ref 26.0–34.0)
MCHC: 30.9 g/dL — AB (ref 32.0–36.0)
MCV: 64.4 fL — AB (ref 80.0–100.0)
MONO ABS: 0.7 10*3/uL (ref 0.2–0.9)
MONOS PCT: 8 %
Neutro Abs: 6.2 10*3/uL (ref 1.4–6.5)
Neutrophils Relative %: 69 %
PLATELETS: 333 10*3/uL (ref 150–440)
RBC: 4.61 MIL/uL (ref 3.80–5.20)
RDW: 26.8 % — AB (ref 11.5–14.5)
WBC: 9 10*3/uL (ref 3.6–11.0)

## 2017-04-30 LAB — URINALYSIS, ROUTINE W REFLEX MICROSCOPIC
BILIRUBIN URINE: NEGATIVE
Glucose, UA: NEGATIVE mg/dL
Ketones, ur: 5 mg/dL — AB
Leukocytes, UA: NEGATIVE
NITRITE: NEGATIVE
PROTEIN: 30 mg/dL — AB
RBC / HPF: NONE SEEN RBC/hpf (ref 0–5)
Specific Gravity, Urine: 1.017 (ref 1.005–1.030)
pH: 6 (ref 5.0–8.0)

## 2017-04-30 LAB — PROCALCITONIN: Procalcitonin: <0.1 ng/mL

## 2017-04-30 LAB — TROPONIN I: Troponin I: <0.03 ng/mL (ref <0.03)

## 2017-04-30 LAB — LACTIC ACID, PLASMA: Lactic Acid, Venous: 1.1 mmol/L (ref 0.5–1.9)

## 2017-04-30 LAB — PROTIME-INR
INR: 1.11
PROTHROMBIN TIME: 14.4 s (ref 11.4–15.2)

## 2017-04-30 LAB — GLUCOSE, CAPILLARY: GLUCOSE-CAPILLARY: 113 mg/dL — AB (ref 65–99)

## 2017-04-30 LAB — LIPASE, BLOOD: LIPASE: 20 U/L (ref 11–51)

## 2017-04-30 LAB — MRSA PCR SCREENING: MRSA by PCR: NEGATIVE

## 2017-04-30 LAB — BRAIN NATRIURETIC PEPTIDE: B NATRIURETIC PEPTIDE 5: 537 pg/mL — AB (ref 0.0–100.0)

## 2017-04-30 MED ORDER — DEXTROSE 5 % IV SOLN
2.0000 g | Freq: Once | INTRAVENOUS | Status: AC
  Administered 2017-04-30: 2 g via INTRAVENOUS
  Filled 2017-04-30: qty 2

## 2017-04-30 MED ORDER — ASPIRIN EC 81 MG PO TBEC
81.0000 mg | DELAYED_RELEASE_TABLET | Freq: Every day | ORAL | Status: DC
  Administered 2017-05-01 – 2017-05-10 (×10): 81 mg via ORAL
  Filled 2017-04-30 (×10): qty 1

## 2017-04-30 MED ORDER — ORAL CARE MOUTH RINSE
15.0000 mL | Freq: Two times a day (BID) | OROMUCOSAL | Status: DC
  Administered 2017-04-30 – 2017-05-09 (×8): 15 mL via OROMUCOSAL

## 2017-04-30 MED ORDER — IPRATROPIUM-ALBUTEROL 0.5-2.5 (3) MG/3ML IN SOLN
3.0000 mL | Freq: Once | RESPIRATORY_TRACT | Status: AC
  Administered 2017-04-30: 3 mL via RESPIRATORY_TRACT

## 2017-04-30 MED ORDER — POTASSIUM CHLORIDE 10 MEQ/100ML IV SOLN
10.0000 meq | INTRAVENOUS | Status: AC
  Administered 2017-04-30 (×4): 10 meq via INTRAVENOUS
  Filled 2017-04-30 (×4): qty 100

## 2017-04-30 MED ORDER — ESTRADIOL 0.1 MG/GM VA CREA
1.0000 | TOPICAL_CREAM | VAGINAL | Status: DC
  Administered 2017-05-02 – 2017-05-09 (×3): 1 via VAGINAL
  Filled 2017-04-30 (×2): qty 42.5

## 2017-04-30 MED ORDER — PANTOPRAZOLE SODIUM 40 MG IV SOLR
40.0000 mg | INTRAVENOUS | Status: DC
  Administered 2017-04-30 – 2017-05-01 (×2): 40 mg via INTRAVENOUS
  Filled 2017-04-30 (×2): qty 40

## 2017-04-30 MED ORDER — FUROSEMIDE 10 MG/ML IJ SOLN
20.0000 mg | Freq: Two times a day (BID) | INTRAMUSCULAR | Status: DC
  Administered 2017-04-30 – 2017-05-02 (×5): 20 mg via INTRAVENOUS
  Filled 2017-04-30 (×5): qty 2

## 2017-04-30 MED ORDER — HYDROCORTISONE 0.5 % EX CREA
1.0000 "application " | TOPICAL_CREAM | Freq: Four times a day (QID) | CUTANEOUS | Status: DC
  Administered 2017-04-30 – 2017-05-10 (×35): 1 via TOPICAL
  Filled 2017-04-30: qty 28.35

## 2017-04-30 MED ORDER — CHLORHEXIDINE GLUCONATE 0.12 % MT SOLN
15.0000 mL | Freq: Two times a day (BID) | OROMUCOSAL | Status: DC
  Administered 2017-04-30 – 2017-05-10 (×10): 15 mL via OROMUCOSAL
  Filled 2017-04-30 (×17): qty 15

## 2017-04-30 NOTE — Consult Note (Signed)
[facility] Consultation   ASSESSMENT/PLAN   Atrial fibrillation with rapid ventricular response. Presently on oral amiodarone and Cardizem. Patient be moved to the intensive care unit if ventricular response increases may require amiodarone infusion.   Congestive heart failure, last measured echocardiogram revealed preserved ejection fraction consistant with Hef-Pef. Diuretics as tolerated. Agree with echocardiogram. Chest x-ray shows bilateral pleural effusions with enlarged cardiac silhouette. EKG reveals atrial fibrillation, right bun branch block with generalized low voltage, unchanged from prior tracing  Hypokalemia replace with follow-up  Anemia. No evidence of active bleeding, stable  Hemoglobin and hematocrit since prior admission  Discussed with patients daughter patient is a DNR but will pt noninvasive ventilation   Name: Victoria Contreras MRN: [id number] DOB: 10-Sep-1924    ADMISSION DATE:  04/30/2017 CONSULTATION DATE:  04/30/17  REFERRING MD :  Hospitalist Service  CHIEF COMPLAINT:  Shortness of  breath   HISTORY OF PRESENT ILLNESS:  Victoria Contreras is a very pleasant 83 year old caucasion female, past medical history is remarkable for congestive heart failure, atrial fibrillation,CLL, prior history of stroke, hypertension, hyperlipidemia, anemia, who lives in a skilled nursing facility, was brought into the emergency department today secondary to shortness of breath and hypoxemic respiratory failure. She denies any prodromal fever, chills, night sweats or sputum production.She states that she has been developing lower extremity  Swelling, PND and orthopnea. Presently she is resting comfortably in emergency department on noninvasive ventilation. She is hemodynamically stable, she is in atrial fibrillation with a ventricular response of 103.  PAST MEDICAL HISTORY :  Past Medical History:  Diagnosis Date  . Atrial fibrillation ([place])   . Chronic diastolic  heart failure ([place])   . CLL (chronic lymphoid leukemia) in relapse ([place]) 04/17/2015  . CVA (cerebral infarction)   . Frequent nosebleeds   . Gout   . Hemangiopericytoma   . HLD (hyperlipidemia)   . HTN (hypertension)   . Mood disorder [facility])    Past Surgical History:  Procedure Laterality Date  . APPENDECTOMY    . BREAST LUMPECTOMY    . CATARACT EXTRACTION BILATERAL W/ ANTERIOR VITRECTOMY     right/left eye  . CHOLECYSTECTOMY    . DILATION AND CURETTAGE OF UTERUS    . hemangiopericytoma resection    . TONSILLECTOMY    . TOTAL KNEE ARTHROPLASTY     right knee   Prior to Admission medications   Medication Sig Start Date End Date Taking? Authorizing Provider  acetaminophen (TYLENOL) 500 MG tablet Take 500 mg by mouth every 4 (four) hours as needed.    [provider]  allopurinol (ZYLOPRIM) 300 MG tablet Take 300 mg by mouth daily.    [provider]  alum & mag hydroxide-simeth (MAALOX/MYLANTA) 200-200-20 MG/5ML suspension Take 30 mLs by mouth every 6 (six) hours as needed for indigestion or heartburn.    [provider]  amiodarone (PACERONE) 200 MG tablet Take 200 mg by mouth daily.    [provider]  bismuth subsalicylate (PEPTO BISMOL) 262 MG/15ML suspension Take 30 mLs by mouth every 6 (six) hours as needed.    [provider]  carbamide peroxide (DEBROX) 6.5 % OTIC solution Place 5 drops into both ears as needed.    [provider]  citalopram (CELEXA) 20 MG tablet Take 20 mg by mouth daily.    [provider]  diltiazem (CARDIZEM CD) 300 MG 24 hr capsule Take 1 capsule (300 mg total) by mouth daily. 09/08/16   [name], MD  diphenhydrAMINE (BENADRYL) 25 mg capsule Take 25 mg by mouth every 6 (six) hours as needed.    [provider]  estradiol (ESTRACE) 0.1 MG/GM vaginal cream Place 1 Applicatorful vaginally every Monday, Wednesday, and Friday.    [provider]  furosemide (LASIX) 20 MG  tablet Take 20 mg by mouth daily. Take 20mg  daily if weight is > 190lb    [provider]  guaiFENesin (ROBITUSSIN) 100 MG/5ML SOLN Take 5 mLs by mouth every 4 (four) hours as needed for cough or to loosen phlegm.    [provider]  hydrocortisone cream 0.5 % Apply 1 application topically 4 (four) times daily. Under nose    [provider]  magnesium hydroxide (MILK OF MAGNESIA) 400 MG/5ML suspension Take 30 mLs by mouth daily as needed for mild constipation.    [provider]  nystatin (NYSTATIN) powder Apply 1 Bottle topically 2 (two) times daily as needed.    [provider]  ondansetron (ZOFRAN) 4 MG tablet Take 4 mg by mouth 3 (three) times daily as needed for nausea or vomiting.    [provider]  pantoprazole (PROTONIX) 40 MG tablet Take 1 tablet (40 mg total) by mouth daily before breakfast. 03/09/17   [name], MD  potassium chloride (K-DUR) 10 MEQ tablet Take 2 tablets (20 mEq total) by mouth daily. Patient taking differently: Take 10 mEq by mouth daily.  10/30/16   [name], MD  rosuvastatin (CRESTOR) 5 MG tablet Take 1 tablet (5 mg total) by mouth daily. 09/07/16   [name], MD   Allergies  Allergen Reactions  . Codeine     Other reaction(s): Unknown  . Sulfa Antibiotics Other (See Comments)    Deathly sick   . Tape Itching    Other reaction(s): Itching of Skin    FAMILY HISTORY:  Family History  Problem Relation Age of Onset  . Other Father        "brain hemorrhage"  . Melanoma Daughter 15  . Alcoholism Sister   . Alzheimer's disease Brother   . Alzheimer's disease Sister   . Cancer Sister   . Kidney disease Neg Hx   . Bladder Cancer Neg Hx    SOCIAL HISTORY:  reports that she has quit smoking. Her smoking use included Cigarettes. She has never used smokeless tobacco. She reports that she does not drink alcohol or use drugs.  REVIEW OF SYSTEMS:    The remainder of systems were reviewed and  were found to be negative other than what is documented in the HPI.    VITAL SIGNS: Temp:  [98.7 F (37.1 C)] 98.7 F (37.1 C) (08/25 0945) Pulse Rate:  [68-104] 104 (08/25 0950) Resp:  [19] 19 (08/25 0921) BP: (114-140)/(70-77) 140/77 (08/25 0921) SpO2:  [97 %-98 %] 97 % (08/25 0950) HEMODYNAMICS:  Physical Examination:   VS: BP 140/77 (BP Location: Right Arm)   Pulse (!) 104   Temp 98.7 F (37.1 C) (Rectal)   Resp 19   SpO2 97%   General Appearance: elderly female,mild respiratory distress on noninvasive ventil Neuro:without focal findings, mental status, speech normal,. HEENT: PERRLA, EOM intact, no ptosis, no other lesions noticed;  Pulmonary: bilateral crackles appreciated both posterior lung fields Cardiovascular irregularly irregular rhythm with ventricular response at 100systolic murmur apiated aortic po Abdomen: Benign, Soft, non-tender, No masses, hepatosplenomegaly, No lymphadenopathy Renal:  No costovertebral tenderness  GU:  Not performed at this time. Endoc: No evident thyromegaly, no signs of acromegaly. Skin:  warm, no rashes, no ecchymosis  Extremities edema is noted midway up the thighs   LABS: Reviewed   LABORATORY PANEL:   CBC  Recent Labs Lab 04/30/17 0920  WBC 9.0  HGB 9.2*  HCT 29.7*  PLT 333    Chemistries   Recent Labs Lab 04/30/17 0920  NA 139  K 3.2*  CL 103  CO2 28  GLUCOSE 110*  BUN 15  CREATININE 0.81  CALCIUM 8.6*  AST 27  ALT 19  ALKPHOS 121  BILITOT 0.7    No results for input(s): GLUCAP in the last 168 hours. No results for input(s): PHART, PCO2ART, PO2ART in the last 168 hours.  Recent Labs Lab 04/30/17 0920  AST 27  ALT 19  ALKPHOS 121  BILITOT 0.7  ALBUMIN 3.3*    Cardiac Enzymes  Recent Labs Lab 04/30/17 0920  TROPONINI <0.03    RADIOLOGY:  Dg Chest Port 1 View  Result Date: 04/30/2017 CLINICAL DATA:  Acute respiratory distress. Hypoxia. Sepsis. Chronic lymphocytic leukemia. EXAM:  PORTABLE CHEST 1 VIEW COMPARISON:  10/31/2016 FINDINGS: Increased cardiac enlargement. Diffuse interstitial and airspace disease is seen bilaterally which is new and likely due diffuse pulmonary edema. Bilateral pleural effusions noted as well as atelectasis or consolidation of both lung bases. IMPRESSION: Diffuse bilateral interstitial and airspace disease with bilateral pleural effusions, likely due to pulmonary edema/congestive heart failure. Bibasilar atelectasis versus consolidation. Electronically Signed   By: Earle Gell M.D.   On: 04/30/2017 10:06    Hermelinda Dellen, DO 04/30/2017, 11:17 AM

## 2017-04-30 NOTE — ED Notes (Signed)
Pt on Bi-pap, tolerating well at this time. Pt given warm blanket.

## 2017-04-30 NOTE — Progress Notes (Signed)
Pt's daughter states that the pt is not on xarelto due to nosebleeds and a low hemoglobin/hematocrit during last admission. Dr. Jefferson Fuel is aware.

## 2017-04-30 NOTE — Progress Notes (Signed)
New Admission Note: Pt arrived to unit from ED. She is on bipap and VSS: BP (!) 134/94 (BP Location: Right Arm)   Pulse (!) 113   Temp 97.7 F (36.5 C) (Axillary)   Resp 14   SpO2 95% . She does not appear to be in acute distress and is resting comfortably in bed.  Arrival Method: ED stretcher  Mental Orientation: A&O to self, place,. time Telemetry: Continuous in room ICU 4. Rhythm is a-fib rate is 116 Skin: Assessed and no breakdown noted. Pink sacral foam applied IV: 2 peripheral IVs to L arm  Pain: denies Unit Orientation: Completed.

## 2017-04-30 NOTE — ED Notes (Signed)
X-ray at bedside

## 2017-04-30 NOTE — Progress Notes (Signed)
eLink Physician-Brief Progress Note Patient Name: ADALIS GATTI DOB: [birth year] MRN: [id number]   Date of Service  04/30/2017  HPI/Events of Note  Off BiPAP for several hours - request for diet. Patient hasn't been tried with sips of water.  eICU Interventions  Will change NPO order to NPO except sips and chips.      Intervention Category Intermediate Interventions: Other:  Elchonon Maxson Cornelia Copa 04/30/2017, 4:40 PM

## 2017-04-30 NOTE — ED Provider Notes (Signed)
[facility] Emergency Department Provider Note  ____________________________________________   First MD Initiated Contact with Patient 04/30/17 [phone]     (approximate)  I have reviewed the triage vital signs and the nursing notes.   HISTORY  Chief Complaint Respiratory Distress and Code Sepsis  Level V exemption history Limited by the patient's clinical condition  HPI Victoria Contreras is a 83 y.o. female who comes to the emergency department from her nursing home for shortness of breath that began this morning. History is challenging to obtain as the patient cannot speak very well. According to reports she has a history of COPD and CHF. She was initially saturating in the low 70s when EMS arrived although perked up to the mid 90s on 15 L nonrebreather.   09/01/16 Echo: Study Conclusions  - Left ventricle: The cavity size was normal. Systolic function was   vigorous. The estimated ejection fraction was in the range of 65%   to 70%. - Aortic valve: There was trivial regurgitation. Valve area (Vmax):   3.38 cm^2. - Mitral valve: There was moderate regurgitation. - Left atrium: The atrium was mildly dilated. - Right atrium: The atrium was mildly dilated.  Past Medical History:  Diagnosis Date  . Atrial fibrillation ([place])   . Chronic diastolic heart failure ([place])   . CLL (chronic lymphoid leukemia) in relapse ([place]) 04/17/2015  . CVA (cerebral infarction)   . Frequent nosebleeds   . Gout   . Hemangiopericytoma   . HLD (hyperlipidemia)   . HTN (hypertension)   . Mood disorder [facility])     Patient Active Problem List   Diagnosis Date Noted  . Acute respiratory failure ([place]) 04/30/2017  . Anemia 03/08/2017  . Chronic diastolic heart failure ([place])   . Mood disorder ([place])   . Persistent atrial fibrillation ([place]) 08/31/2016  . Dysuria 11/06/2015  . Cystocele, grade 2 11/06/2015  . Atrophic vaginitis 11/06/2015  . CLL (chronic lymphocytic leukemia)  ([place]) 04/17/2015  . 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 APPENDECTOMY    . BREAST LUMPECTOMY    . CATARACT EXTRACTION BILATERAL W/ ANTERIOR VITRECTOMY     right/left eye  . CHOLECYSTECTOMY    . DILATION AND CURETTAGE OF UTERUS    . hemangiopericytoma resection    . TONSILLECTOMY    . TOTAL KNEE ARTHROPLASTY     right knee    Prior to Admission medications   Medication Sig Start Date End Date Taking? Authorizing Provider  acetaminophen (TYLENOL) 500 MG tablet Take 500 mg by mouth every 4 (four) hours as needed.    [provider]  allopurinol (ZYLOPRIM) 300 MG tablet Take 300 mg by mouth daily.    [provider]  alum & mag hydroxide-simeth (MAALOX/MYLANTA) 200-200-20 MG/5ML suspension Take 30 mLs by mouth every 6 (six) hours as needed for indigestion or heartburn.    [provider]  amiodarone (PACERONE) 200 MG tablet Take 200 mg by mouth daily.    [provider]  bismuth subsalicylate (PEPTO BISMOL) 262 MG/15ML suspension Take 30 mLs by mouth every 6 (six) hours as needed.    [provider]  carbamide peroxide (DEBROX) 6.5 % OTIC solution Place 5 drops into both ears as needed.    [provider]  citalopram (CELEXA) 20 MG tablet Take 20 mg by mouth daily.    [provider]  diltiazem (CARDIZEM CD) 300 MG 24 hr capsule Take 1 capsule (300 mg  total) by mouth daily. 09/08/16   [name], MD  diphenhydrAMINE (BENADRYL) 25 mg capsule Take 25 mg by mouth every 6 (six) hours as needed.    [provider]  estradiol (ESTRACE) 0.1 MG/GM vaginal cream Place 1 Applicatorful vaginally every Monday, Wednesday, and Friday.    [provider]  furosemide (LASIX) 20 MG tablet Take 20 mg by mouth daily. Take 20mg  daily if weight is > 190lb    [provider]  guaiFENesin (ROBITUSSIN) 100 MG/5ML SOLN Take 5 mLs by mouth every 4 (four) hours as needed  for cough or to loosen phlegm.    [provider]  hydrocortisone cream 0.5 % Apply 1 application topically 4 (four) times daily. Under nose    [provider]  magnesium hydroxide (MILK OF MAGNESIA) 400 MG/5ML suspension Take 30 mLs by mouth daily as needed for mild constipation.    [provider]  nystatin (NYSTATIN) powder Apply 1 Bottle topically 2 (two) times daily as needed.    [provider]  ondansetron (ZOFRAN) 4 MG tablet Take 4 mg by mouth 3 (three) times daily as needed for nausea or vomiting.    [provider]  pantoprazole (PROTONIX) 40 MG tablet Take 1 tablet (40 mg total) by mouth daily before breakfast. 03/09/17   [name], MD  potassium chloride (K-DUR) 10 MEQ tablet Take 2 tablets (20 mEq total) by mouth daily. Patient taking differently: Take 10 mEq by mouth daily.  10/30/16   [name], MD  rosuvastatin (CRESTOR) 5 MG tablet Take 1 tablet (5 mg total) by mouth daily. 09/07/16   [name], MD    Allergies Codeine; Sulfa antibiotics; and Tape  Family History  Problem Relation Age of Onset  . Other Father        "brain hemorrhage"  . Melanoma Daughter 55  . Alcoholism Sister   . Alzheimer's disease Brother   . Alzheimer's disease Sister   . Cancer Sister   . Kidney disease Neg Hx   . Bladder Cancer Neg Hx     Social History Social History  Substance Use Topics  . Smoking status: Former Smoker    Types: Cigarettes  . Smokeless tobacco: Never Used     Comment: quit 1990  . Alcohol use No    Review of Systems Level V exemption history Limited by the patient's clinical condition  ____________________________________________   PHYSICAL EXAM:  VITAL SIGNS: ED Triage Vitals  Enc Vitals Group     BP      Pulse      Resp      Temp      Temp src      SpO2      Weight      Height      Head Circumference      Peak Flow      Pain Score      Pain Loc      Pain Edu?      Excl. in [place]?      Constitutional: turned onto her right side in moderate to severe respiratory distress audible wheezing elevated respiratory rate Eyes: PERRL EOMI. Head: Atraumatic. Nose: No congestion/rhinnorhea. Mouth/Throat: No trismus Neck: No stridor.   Cardiovascular: irregularly irregular and tachycardic no murmurs appreciated Respiratory: moderate respiratory distress using accessory muscles limited air movement crackles at the bilateral bases with slight expiratory wheeze Gastrointestinal: obese soft nontender Musculoskeletal: bilateral lower extremity edema legs equal in size 2+ to mid shin Neurologic: .  No gross focal neurologic deficits are appreciated. Skin:  Skin is warm, dry and intact. No rash noted.     ____________________________________________   DIFFERENTIAL includes but not limited to  Flash pulmonary edema, congestive heart failure, COPD, pneumothorax, pulmonary embolism, and sepsis ____________________________________________   LABS (all labs ordered are listed, but only abnormal results are displayed)  Labs Reviewed  COMPREHENSIVE METABOLIC PANEL - Abnormal; Notable for the following:       Result Value   Potassium 3.2 (*)    Glucose, Bld 110 (*)    Calcium 8.6 (*)    Total Protein 6.3 (*)    Albumin 3.3 (*)    All other components within normal limits  CBC WITH DIFFERENTIAL/PLATELET - Abnormal; Notable for the following:    Hemoglobin 9.2 (*)    HCT 29.7 (*)    MCV 64.4 (*)    MCH 19.9 (*)    MCHC 30.9 (*)    RDW 26.8 (*)    All other components within normal limits  URINALYSIS, ROUTINE W REFLEX MICROSCOPIC - Abnormal; Notable for the following:    Color, Urine AMBER (*)    APPearance CLOUDY (*)    Hgb urine dipstick SMALL (*)    Ketones, ur 5 (*)    Protein, ur 30 (*)    Bacteria, UA MANY (*)    Squamous Epithelial / LPF 0-5 (*)    All other components within normal limits  BRAIN NATRIURETIC PEPTIDE - Abnormal; Notable for the following:    B  Natriuretic Peptide 537.0 (*)    All other components within normal limits  CULTURE, BLOOD (ROUTINE X 2)  CULTURE, BLOOD (ROUTINE X 2)  URINE CULTURE  LACTIC ACID, PLASMA  PROTIME-INR  LIPASE, BLOOD  TROPONIN I  PROCALCITONIN  URINALYSIS, COMPLETE (UACMP) WITH MICROSCOPIC    Elevated beta natruretic peptide is consistent with fluid overload Urinalysis is consistent with infection __________________________________________  EKG  ED ECG REPORT I, [name], the attending physician, personally viewed and interpreted this ECG.  Date: 04/30/2017 EKG Time: 0925 Rate: 109 Rhythm: atrial fibrillation with rapid ventricular response QRS Axis: normal Intervals: rightward axis ST/T Wave abnormalities: normal Narrative Interpretation: low voltage poor R-wave progression no signs of acute ischemia noted difficult to interpret secondary to artifact abnormal EKG. Right bundle pattern  ____________________________________________  RADIOLOGY  Chest x-ray suggestive of fluid overload likely from congestive heart failure ____________________________________________   PROCEDURES  Procedure(s) performed: no  Procedures  Critical Care performed: yes  CRITICAL CARE Performed by: [name]   Total critical care time: 35 minutes  Critical care time was exclusive of separately billable procedures and treating other patients.  Critical care was necessary to treat or prevent imminent or life-threatening deterioration.  Critical care was time spent personally by me on the following activities: development of treatment plan with patient and/or surrogate as well as nursing, discussions with consultants, evaluation of patient's response to treatment, examination of patient, obtaining history from patient or surrogate, ordering and performing treatments and interventions, ordering and review of laboratory studies, ordering and review of radiographic studies, pulse oximetry and  re-evaluation of patient's condition.   Observation: no ____________________________________________   INITIAL IMPRESSION / ASSESSMENT AND PLAN / ED COURSE  Pertinent labs & imaging results that were available during my care of the patient were reviewed by me and considered in my medical decision making (see chart for details).  On arrival the patient is in moderate respiratory distress. She is saturating in the mid 90s  on 15 L. Unclear initially if this is secondary to COPD versus CHF so initially gave her several nebulizations which did not help. Chest x-ray shows pulmonary edema which is most likely related to congestive heart failure. Nebulizations stopped and she was placed on BiPAP with immediate improvement in her respiratory status. She is afebrile. Her urinalysis is consistent with infection however I do not believe this is the underlying etiology of her respiratory status. I will cover her with ceftriaxone now but regardless she requires inpatient admission to a monitored setting for her respiratory status.     ----------------------------------------- 9:50 AM on 04/30/2017 -----------------------------------------  The patient's chest x-ray appears to me to show florid pulmonary edema. We will stop the nebulizations and put her on BiPAP now. ____________________________________________   FINAL CLINICAL IMPRESSION(S) / ED DIAGNOSES  Final diagnoses:  Acute on chronic congestive heart failure, unspecified heart failure type ([place])  Acute cystitis without hematuria      NEW MEDICATIONS STARTED DURING THIS VISIT:  New Prescriptions   No medications on file     Note:  This document was prepared using Dragon voice recognition software and may include unintentional dictation errors.     [name], MD 04/30/17 1122

## 2017-04-30 NOTE — Progress Notes (Signed)
Pt taken off bipap placed on 5lpm [place], sats 96%, respiratory rate 20/min, no shortness of breath noted, placed states that she feels better, will continue to monitor

## 2017-04-30 NOTE — ED Triage Notes (Signed)
Pt brought in by [facility] from [facility] for respiratory distress. EMS reports that pt was found minimally responsive with O2 sats in the 70's. Pt was started on NRB and sats improved to 94-95%. Pt alert at this time. MD at bedside

## 2017-04-30 NOTE — H&P (Signed)
[place] [facility] [place] NAME: Victoria Contreras    MR#:  [id number]  DATE OF BIRTH:  1925/04/04  DATE OF ADMISSION:  04/30/2017  PRIMARY CARE PHYSICIAN: [name], MD   REQUESTING/REFERRING PHYSICIAN:   CHIEF COMPLAINT:   SOB HISTORY OF PRESENT ILLNESS:  [name]  is a 83 y.o. female with a known history of Chronic congestive heart failure diastolic, atrial fibrillation on Xarelto according to [name] note, CLL, hyperlipidemia and hypertension is brought into the ED with severe shortness of breath. Patient was placed on BiPAP. Initial troponin is negative and lactic acid is also in the normal range. Patient started feeling better on BiPAP and hospitalist team is called to admit the patient, patient's daughter is on her way to hospital  PAST MEDICAL HISTORY:   Past Medical History:  Diagnosis Date  . Atrial fibrillation ([place])   . Chronic diastolic heart failure ([place])   . CLL (chronic lymphoid leukemia) in relapse ([name]) 04/17/2015  . CVA (cerebral infarction)   . Frequent nosebleeds   . Gout   . Hemangiopericytoma   . HLD (hyperlipidemia)   . HTN (hypertension)   . Mood disorder ([place])     PAST SURGICAL HISTOIRY:   Past Surgical History:  Procedure Laterality Date  . APPENDECTOMY    . BREAST LUMPECTOMY    . CATARACT EXTRACTION BILATERAL W/ ANTERIOR VITRECTOMY     right/left eye  . CHOLECYSTECTOMY    . DILATION AND CURETTAGE OF UTERUS    . hemangiopericytoma resection    . TONSILLECTOMY    . TOTAL KNEE ARTHROPLASTY     right knee    SOCIAL HISTORY:   Social History  Substance Use Topics  . Smoking status: Former Smoker    Types: Cigarettes  . Smokeless tobacco: Never Used     Comment: quit 1990  . Alcohol use No    FAMILY HISTORY:   Family History  Problem Relation Age of Onset  . Other Father        "brain hemorrhage"  . Melanoma Daughter 72  . Alcoholism Sister   . Alzheimer's disease Brother    . Alzheimer's disease Sister   . Cancer Sister   . Kidney disease Neg Hx   . Bladder Cancer Neg Hx     DRUG ALLERGIES:   Allergies  Allergen Reactions  . Codeine     Other reaction(s): Unknown  . Sulfa Antibiotics Other (See Comments)    Deathly sick   . Tape Itching    Other reaction(s): Itching of Skin    REVIEW OF SYSTEMS:  CONSTITUTIONAL: No fever, fatigue or weakness.  EYES: No blurred or double vision.  EARS, NOSE, AND THROAT: No tinnitus or ear pain.  RESPIRATORY: No cough, reports shortness of breath, denies wheezing or hemoptysis.  CARDIOVASCULAR: No chest pain, orthopnea, edema.  GASTROINTESTINAL: No nausea, vomiting, diarrhea or abdominal pain.  GENITOURINARY: No dysuria, hematuria.  ENDOCRINE: No polyuria, nocturia,  HEMATOLOGY: No anemia, easy bruising or bleeding SKIN: No rash or lesion. MUSCULOSKELETAL: No joint pain or arthritis.   NEUROLOGIC: No tingling, numbness, weakness.  PSYCHIATRY: No anxiety or depression.   MEDICATIONS AT HOME:   Prior to Admission medications   Medication Sig Start Date End Date Taking? Authorizing Provider  acetaminophen (TYLENOL) 500 MG tablet Take 500 mg by mouth every 4 (four) hours as needed.    [provider]  allopurinol (ZYLOPRIM) 300 MG tablet Take 300 mg by mouth  daily.    [provider]  alum & mag hydroxide-simeth (MAALOX/MYLANTA) 200-200-20 MG/5ML suspension Take 30 mLs by mouth every 6 (six) hours as needed for indigestion or heartburn.    [provider]  amiodarone (PACERONE) 200 MG tablet Take 200 mg by mouth daily.    [provider]  bismuth subsalicylate (PEPTO BISMOL) 262 MG/15ML suspension Take 30 mLs by mouth every 6 (six) hours as needed.    [provider]  carbamide peroxide (DEBROX) 6.5 % OTIC solution Place 5 drops into both ears as needed.    [provider]  citalopram (CELEXA) 20 MG tablet Take 20 mg by mouth daily.    [provider]   diltiazem (CARDIZEM CD) 300 MG 24 hr capsule Take 1 capsule (300 mg total) by mouth daily. 09/08/16   [name], MD  diphenhydrAMINE (BENADRYL) 25 mg capsule Take 25 mg by mouth every 6 (six) hours as needed.    [provider]  estradiol (ESTRACE) 0.1 MG/GM vaginal cream Place 1 Applicatorful vaginally every Monday, Wednesday, and Friday.    [provider]  furosemide (LASIX) 20 MG tablet Take 20 mg by mouth daily. Take 20mg  daily if weight is > 190lb    [provider]  guaiFENesin (ROBITUSSIN) 100 MG/5ML SOLN Take 5 mLs by mouth every 4 (four) hours as needed for cough or to loosen phlegm.    [provider]  hydrocortisone cream 0.5 % Apply 1 application topically 4 (four) times daily. Under nose    [provider]  magnesium hydroxide (MILK OF MAGNESIA) 400 MG/5ML suspension Take 30 mLs by mouth daily as needed for mild constipation.    [provider]  nystatin (NYSTATIN) powder Apply 1 Bottle topically 2 (two) times daily as needed.    [provider]  ondansetron (ZOFRAN) 4 MG tablet Take 4 mg by mouth 3 (three) times daily as needed for nausea or vomiting.    [provider]  pantoprazole (PROTONIX) 40 MG tablet Take 1 tablet (40 mg total) by mouth daily before breakfast. 03/09/17   [name], MD  potassium chloride (K-DUR) 10 MEQ tablet Take 2 tablets (20 mEq total) by mouth daily. Patient taking differently: Take 10 mEq by mouth daily.  10/30/16   [name], MD  rosuvastatin (CRESTOR) 5 MG tablet Take 1 tablet (5 mg total) by mouth daily. 09/07/16   [name], MD      VITAL SIGNS:  Blood pressure 140/77, pulse (!) 104, temperature 98.7 F (37.1 C), temperature source Rectal, resp. rate 19, SpO2 97 %.  PHYSICAL EXAMINATION:  GENERAL:  83 y.o.-year-old patient lying in the bed with no acute distress.  EYES: Pupils equal, round, reactive to light and accommodation. No scleral icterus.  Extraocular muscles intact.  HEENT: Head atraumatic, normocephalic. Oropharynx and nasopharynx clear.  NECK:  Supple, no jugular venous distention. No thyroid enlargement, no tenderness.  LUNGS: Diminished breath sounds bilaterally, no wheezing, rales,rhonchi or crepitation. No use of accessory muscles of respiration. On BiPAP CARDIOVASCULAR: S1, S2 normal. No murmurs, rubs, or gallops.  ABDOMEN: Soft, nontender, nondistended. Bowel sounds present. No organomegaly or mass.  EXTREMITIES: No pedal edema, cyanosis, or clubbing.  NEUROLOGIC: Cranial nerves are grossly intact. Sensory is intact. Patient is awake and oriented, motor-strength seems to be at her baseline PSYCHIATRIC: The patient is alert and oriented x 3.  SKIN: No obvious rash, lesion, or ulcer.   LABORATORY PANEL:   CBC  Recent Labs Lab 04/30/17 0920  WBC 9.0  HGB 9.2*  HCT 29.7*  PLT 333   ------------------------------------------------------------------------------------------------------------------  Chemistries   Recent Labs Lab 04/30/17 0920  NA 139  K 3.2*  CL 103  CO2 28  GLUCOSE 110*  BUN 15  CREATININE 0.81  CALCIUM 8.6*  AST 27  ALT 19  ALKPHOS 121  BILITOT 0.7   ------------------------------------------------------------------------------------------------------------------  Cardiac Enzymes  Recent Labs Lab 04/30/17 0920  TROPONINI <0.03   ------------------------------------------------------------------------------------------------------------------  RADIOLOGY:  Dg Chest Port 1 View  Result Date: 04/30/2017 CLINICAL DATA:  Acute respiratory distress. Hypoxia. Sepsis. Chronic lymphocytic leukemia. EXAM: PORTABLE CHEST 1 VIEW COMPARISON:  10/31/2016 FINDINGS: Increased cardiac enlargement. Diffuse interstitial and airspace disease is seen bilaterally which is new and likely due diffuse pulmonary edema. Bilateral pleural effusions noted as well as atelectasis or consolidation of both  lung bases. IMPRESSION: Diffuse bilateral interstitial and airspace disease with bilateral pleural effusions, likely due to pulmonary edema/congestive heart failure. Bibasilar atelectasis versus consolidation. Electronically Signed   By: Earle Gell M.D.   On: 04/30/2017 10:06    EKG:   Orders placed or performed during the hospital encounter of 04/30/17  . ED EKG 12-Lead  . ED EKG 12-Lead  . EKG 12-Lead  . EKG 12-Lead    IMPRESSION AND PLAN:   Victoria Contreras  is a 83 y.o. female with a known history of Chronic congestive heart failure diastolic, atrial fibrillation on Xarelto according to [name] note, CLL, hyperlipidemia and hypertension is brought into the ED with severe shortness of breath. Patient was placed on BiPAP.  #Acute hypoxic respiratory failure secondary to decompensated diastolic congestive heart failure Admit to stepdown unit Continue BiPAP Consulted intensivist notified  #Acute CHF exacerbation Continue BiPAP Lasix IV Consult cardiology-kc, patient was seen by Dr. Ubaldo Glassing in the past Monitor intake and output, daily weights Holding beta blocker and ACE inhibitor at this time as blood pressure is soft and patient is with decompensated CHF   #Hypokalemia Currently patient is on Lasix IV, replete, monitor closely  #Chronic history of atrial fibrillation Currently rate controlled, Xarelto is included in home medication list by Dr. Ubaldo Glassing during patient's recent office visit Pharmacy is concurrently reviewing the medication list, will update  #HTN  Currently patient is on BiPAP and blood pressure is soft holding by mouth meds    DVT prophylaxis Xarelto will be resumed after pharmacy update home medication list  GI prophylaxis of Protonix IV         All the records are reviewed and case discussed with ED provider. Management plans discussed with the patient, family and they are in agreement.  CODE STATUS: dnr,Daughter is the healthcare power of  attorney  TOTAL CRITICAL CARE TIME TAKING CARE OF THIS PATIENT: 45 minutes.   Note: This dictation was prepared with Dragon dictation along with smaller phrase technology. Any transcriptional errors that result from this process are unintentional.  Nicholes Mango M.D on 04/30/2017 at 10:56 AM  Between 7am to 6pm - Pager - [phone]  After 6pm go to [email or web address] - password [email or web address] [facility]  Office  [phone]  CC: Primary care physician; [name], MD

## 2017-05-01 LAB — BASIC METABOLIC PANEL
ANION GAP: 9 (ref 5–15)
BUN: 12 mg/dL (ref 6–20)
CHLORIDE: 104 mmol/L (ref 101–111)
CO2: 28 mmol/L (ref 22–32)
Calcium: 8.3 mg/dL — ABNORMAL LOW (ref 8.9–10.3)
Creatinine, Ser: 0.77 mg/dL (ref 0.44–1.00)
GFR calc Af Amer: >60 mL/min (ref >60)
GLUCOSE: 91 mg/dL (ref 65–99)
POTASSIUM: 3.1 mmol/L — AB (ref 3.5–5.1)
Sodium: 141 mmol/L (ref 135–145)

## 2017-05-01 LAB — URINE CULTURE

## 2017-05-01 MED ORDER — DILTIAZEM HCL ER COATED BEADS 180 MG PO CP24
300.0000 mg | ORAL_CAPSULE | Freq: Every day | ORAL | Status: DC
  Administered 2017-05-01 – 2017-05-05 (×5): 300 mg via ORAL
  Administered 2017-05-06: 120 mg via ORAL
  Administered 2017-05-07 – 2017-05-10 (×4): 300 mg via ORAL
  Filled 2017-05-01 (×10): qty 1

## 2017-05-01 MED ORDER — AMIODARONE HCL 200 MG PO TABS
200.0000 mg | ORAL_TABLET | Freq: Every day | ORAL | Status: DC
  Administered 2017-05-01 – 2017-05-10 (×10): 200 mg via ORAL
  Filled 2017-05-01 (×10): qty 1

## 2017-05-01 MED ORDER — ALLOPURINOL 300 MG PO TABS
300.0000 mg | ORAL_TABLET | Freq: Every day | ORAL | Status: DC
  Administered 2017-05-01 – 2017-05-10 (×10): 300 mg via ORAL
  Filled 2017-05-01 (×10): qty 1

## 2017-05-01 MED ORDER — IPRATROPIUM-ALBUTEROL 0.5-2.5 (3) MG/3ML IN SOLN
3.0000 mL | RESPIRATORY_TRACT | Status: DC | PRN
Start: 2017-05-01 — End: 2017-05-10

## 2017-05-01 MED ORDER — PANTOPRAZOLE SODIUM 40 MG PO TBEC
40.0000 mg | DELAYED_RELEASE_TABLET | Freq: Every day | ORAL | Status: DC
  Administered 2017-05-02 – 2017-05-10 (×9): 40 mg via ORAL
  Filled 2017-05-01 (×9): qty 1

## 2017-05-01 MED ORDER — POTASSIUM CHLORIDE 10 MEQ/100ML IV SOLN
10.0000 meq | INTRAVENOUS | Status: AC
  Administered 2017-05-01 (×2): 10 meq via INTRAVENOUS
  Filled 2017-05-01 (×2): qty 100

## 2017-05-01 MED ORDER — ENOXAPARIN SODIUM 40 MG/0.4ML ~~LOC~~ SOLN
40.0000 mg | SUBCUTANEOUS | Status: DC
  Administered 2017-05-01 – 2017-05-09 (×9): 40 mg via SUBCUTANEOUS
  Filled 2017-05-01 (×9): qty 0.4

## 2017-05-01 MED ORDER — CITALOPRAM HYDROBROMIDE 20 MG PO TABS
20.0000 mg | ORAL_TABLET | Freq: Every day | ORAL | Status: DC
  Administered 2017-05-01 – 2017-05-10 (×10): 20 mg via ORAL
  Filled 2017-05-01 (×10): qty 1

## 2017-05-01 MED ORDER — IPRATROPIUM-ALBUTEROL 0.5-2.5 (3) MG/3ML IN SOLN
RESPIRATORY_TRACT | Status: AC
  Filled 2017-05-01: qty 3

## 2017-05-01 NOTE — Progress Notes (Signed)
Report called to [facility] RN on 2A. Patient being transferred to room 238. Patient's daughter Wannetta Sender called and notified of transfer. Patient alert and oriented, still with +1 generalized edema. On 5 L nasal cannula while awake and on 8 L venturi mask while asleep as patient breathes solely through mouth. No adventitious breath sounds heard on assessment, but bases very diminished. Patient started on incentive spirometer and only can complete 675 mL. Heart rate in afib but rate controlled, home dose amiodarone restarted today.

## 2017-05-01 NOTE — Progress Notes (Signed)
Pharmacist - Prescriber Communication  Enoxaparin dose modified from 30 mg subcutaneously once daily to 40 mg subcutaneously once daily due to CrCl > 30 mL/min and ABW > 45 kg.  Shiah Berhow A. [place], [place].D., BCPS Clinical Pharmacist 05/01/2017 12:18

## 2017-05-01 NOTE — Progress Notes (Signed)
Patient continues on 5L oxygen via [place].   Sats maintained above 90%, however, does drop into 80s at times. Attempts to wean unsuccessful.  O2 sats decrease with movement in bed. HOB elevated. Able to make needs known.  No acute distress noted at this time. Will monitor.

## 2017-05-01 NOTE — Clinical Social Work Note (Signed)
Clinical Social Work Assessment  Patient Details  Name: Victoria Contreras MRN: [id number] Date of Birth: [birth year]  Date of referral:  05/01/17               Reason for consult:  Facility Placement                Permission sought to share information with:  Facility Sport and exercise psychologist Permission granted to share information::  Yes, Verbal Permission Granted  Name::        Agency::  [facility]  Relationship::     Contact Information:  [phone]  Housing/Transportation Living arrangements for the past 2 months:  [place] of Information:  Patient, Medical Team Patient Interpreter Needed:  None Criminal Activity/Legal Involvement Pertinent to Current Situation/Hospitalization:  No - Comment as needed Significant Relationships:  Adult Children Lives with:  Facility Resident Do you feel safe going back to the place where you live?  Yes Need for family participation in patient care:  No (Coment)  Care giving concerns: Patient admitted from [facility]   Social Worker assessment / plan:  CSW met with patient at bedside to discuss discharge planning. The patient confirmed that she admitted from [facility] and plans to return. The patient gave permission to contact her son and daughter. The CSW attempted with no answer and no ability to leave a HIPPA compliant voice mail.  The patient will most likely discharge tomorrow or Tuesday. The CSW will follow up with the ALF on Monday and re-attempt contact with the family. CSW will facilitate discharge when the patient is stable.  Employment status:  Retired Forensic scientist:  Medicare PT Recommendations:  Not assessed at this time Information / Referral to community resources:     Patient/Family's Response to care: The patient thanked the CSW for assistance.  Patient/Family's Understanding of and Emotional Response to Diagnosis, Current Treatment, and Prognosis:  The patient understands and agrees  with the discharge plan back to [facility] when she is stable.  Emotional Assessment Appearance:  Appears stated age Attitude/Demeanor/Rapport:  Lethargic Affect (typically observed):  Accepting, Appropriate, Pleasant Orientation:  Oriented to Self, Oriented to Place, Oriented to  Time, Oriented to Situation Alcohol / Substance use:  Never Used Psych involvement (Current and /or in the community):  No (Comment)  Discharge Needs  Concerns to be addressed:  Care Coordination, Discharge Planning Concerns Readmission within the last 30 days:  No Current discharge risk:  Chronically ill Barriers to Discharge:  Continued Medical Work up   [employer], LCSW 05/01/2017, 3:07 PM

## 2017-05-01 NOTE — Progress Notes (Signed)
Pt still remains off bipap, no shortness of breath noted at this time, pt placed on venti mask 40% by RN, sats 93%, respiratory rate 18/min, will continue to monitor

## 2017-05-01 NOTE — NC FL2 (Signed)
[place] LEVEL OF CARE SCREENING TOOL     IDENTIFICATION  Patient Name: Victoria Contreras Birthdate: 1925-03-26 Sex: female Admission Date (Current Location): 04/30/2017  [place] and [place] Number:  Engineering geologist and Address:  [facility], [address] [place], [place] [address]      Provider Number: [id number]  Attending Physician Name and Address:  Henreitta Leber, MD  Relative Name and Phone Number:  Lavona Mound (daughter) [phone]    Current Level of Care: Domiciliary (Rest home) Recommended Level of Care: [place] Prior Approval Number:    Date Approved/Denied: 11/30/12 PASRR Number: [id number]  Discharge Plan: Domiciliary (Rest home)    Current Diagnoses: Patient Active Problem List   Diagnosis Date Noted  . Acute respiratory failure ([place]) 04/30/2017  . Anemia 03/08/2017  . Chronic diastolic heart failure ([place])   . Mood disorder ([place])   . Persistent atrial fibrillation ([place]) 08/31/2016  . Dysuria 11/06/2015  . Cystocele, grade 2 11/06/2015  . Atrophic vaginitis 11/06/2015  . CLL (chronic lymphocytic leukemia) ([place]) 04/17/2015  . 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    Orientation RESPIRATION BLADDER Height & Weight     Self, Time, Situation, Place  O2 (5L o2) Continent Weight: 195 lb 12.3 oz (88.8 kg) Height:  5\' 9"  (175.3 cm)  BEHAVIORAL SYMPTOMS/MOOD NEUROLOGICAL BOWEL NUTRITION STATUS      Continent Diet (Heart healthy)  AMBULATORY STATUS COMMUNICATION OF NEEDS Skin   Independent Verbally Normal                       Personal Care Assistance Level of Assistance  Bathing, Feeding, Dressing Bathing Assistance: Independent Feeding assistance: Independent Dressing Assistance: Independent     Functional Limitations Info             SPECIAL CARE FACTORS FREQUENCY                       Contractures Contractures Info: Not present    Additional Factors Info  Code  Status, Allergies, Psychotropic Code Status Info: DNR Allergies Info: Codeine, Sulfa Antibiotics, Tape Psychotropic Info: Celexa         Current Medications (05/01/2017):  This is the current hospital active medication list Current Facility-Administered Medications  Medication Dose Route Frequency Provider Last Rate Last Dose  . allopurinol (ZYLOPRIM) tablet 300 mg  300 mg Oral Daily Henreitta Leber, MD   300 mg at 05/01/17 1247  . amiodarone (PACERONE) tablet 200 mg  200 mg Oral Daily Conforti, John, DO   200 mg at 05/01/17 1031  . aspirin EC tablet 81 mg  81 mg Oral Daily Gouru, Aruna, MD   81 mg at 05/01/17 0911  . chlorhexidine (PERIDEX) 0.12 % solution 15 mL  15 mL Mouth Rinse BID Gouru, Aruna, MD   15 mL at 05/01/17 0911  . citalopram (CELEXA) tablet 20 mg  20 mg Oral Daily Henreitta Leber, MD   20 mg at 05/01/17 1248  . diltiazem (CARDIZEM CD) 24 hr capsule 300 mg  300 mg Oral Daily Henreitta Leber, MD   300 mg at 05/01/17 1246  . enoxaparin (LOVENOX) injection 40 mg  40 mg Subcutaneous Q24H Henreitta Leber, MD      . Derrill Memo ON 05/02/2017] estradiol (ESTRACE) vaginal cream 1 Applicatorful  1 Applicatorful Vaginal Q M,W,F Gouru, Aruna, MD      . furosemide (LASIX) injection 20 mg  20 mg Intravenous Q12H  Nicholes Mango, MD   20 mg at 05/01/17 1159  . hydrocortisone cream 0.5 % 1 application  1 application Topical QID Nicholes Mango, MD   1 application at 63/81/77 0911  . ipratropium-albuterol (DUONEB) 0.5-2.5 (3) MG/3ML nebulizer solution 3 mL  3 mL Nebulization Q4H PRN Varughese, Bincy S, NP      . MEDLINE mouth rinse  15 mL Mouth Rinse q12n4p Gouru, Aruna, MD   15 mL at 04/30/17 1407  . [START ON 05/02/2017] pantoprazole (PROTONIX) EC tablet 40 mg  40 mg Oral QAC breakfast Henreitta Leber, MD         Discharge Medications: Please see discharge summary for a list of discharge medications.  Relevant Imaging Results:  Relevant Lab Results:   Additional Information SS#  [id number]  Zettie Pho, LCSW

## 2017-05-01 NOTE — Consult Note (Signed)
[facility] Card[facility] Consultation Note  Patient ID: Victoria Contreras, MRN: [id number], DOB/AGE: June 08, 1925 83 y.o. Admit date: 04/30/2017   Date of Consult: 05/01/2017 Primary Physician: [name], MD Primary Cardiologist: Ubaldo Glassing  Chief Complaint:  Chief Complaint  Patient presents with  . Respiratory Distress  . Code Sepsis   Reason for Consult: acute heart failure  HPI: 83 y.o. female with the known chronic nonvalvular atrial fibrillation with chronic lymphocytic leukemia essential hypertension makes hyperlipidemia having acute heart failure of unknown etiology with pulmonary edema and a BNP of 537. There is no evidence of myocardial infarction with a normal troponin. The patient did receive the furosemide injection for which has helped a great deal with pulmonary edema and significant improvements of shortness of breath overnight. The patient currently feels quite well with better heart rate control of the atrial fibrillation.  Past Medical History:  Diagnosis Date  . Atrial fibrillation ([place])   . Chronic diastolic heart failure ([place])   . CLL (chronic lymphoid leukemia) in relapse ([place]) 04/17/2015  . CVA (cerebral infarction)   . Frequent nosebleeds   . Gout   . Hemangiopericytoma   . HLD (hyperlipidemia)   . HTN (hypertension)   . Mood disorder [facility])       Surgical History:  Past Surgical History:  Procedure Laterality Date  . APPENDECTOMY    . BREAST LUMPECTOMY    . CATARACT EXTRACTION BILATERAL W/ ANTERIOR VITRECTOMY     right/left eye  . CHOLECYSTECTOMY    . DILATION AND CURETTAGE OF UTERUS    . hemangiopericytoma resection    . TONSILLECTOMY    . TOTAL KNEE ARTHROPLASTY     right knee     Home Meds: Prior to Admission medications   Medication Sig Start Date End Date Taking? Authorizing Provider  acetaminophen (TYLENOL) 650 MG CR tablet Take 650 mg by mouth 2 (two) times daily. Take 1 tablet at 0700 and 1 tablet at 2100   Yes [provider]   allopurinol (ZYLOPRIM) 300 MG tablet Take 300 mg by mouth daily.   Yes [provider]  alum & mag hydroxide-simeth (MAALOX/MYLANTA) 200-200-20 MG/5ML suspension Take 30 mLs by mouth every 4 (four) hours as needed for indigestion or heartburn.    Yes [provider]  amiodarone (PACERONE) 200 MG tablet Take 200 mg by mouth daily.   Yes [provider]  bismuth subsalicylate (PEPTO BISMOL) 262 MG/15ML suspension Take 10 mLs by mouth 3 (three) times daily as needed.    Yes [provider]  carbamide peroxide (DEBROX) 6.5 % OTIC solution Place 5 drops into both ears as needed.   Yes [provider]  citalopram (CELEXA) 20 MG tablet Take 20 mg by mouth daily.   Yes [provider]  Dextromethorphan-Guaifenesin (TUSSIN DM) 10-100 MG/5ML liquid Take 10 mLs by mouth every 4 (four) hours as needed.   Yes [provider]  diltiazem (CARDIZEM CD) 300 MG 24 hr capsule Take 1 capsule (300 mg total) by mouth daily. 09/08/16  Yes [name], MD  diphenhydrAMINE (BENADRYL) 25 mg capsule Take 25 mg by mouth every 4 (four) hours as needed.    Yes [provider]  estradiol (ESTRACE) 0.1 MG/GM vaginal cream Place 1 Applicatorful vaginally every Monday, Wednesday, and Friday.   Yes [provider]  furosemide (LASIX) 20 MG tablet Take 20 mg by mouth daily. Take 20mg  daily if weight is > 190lb   Yes [provider]  hydrocortisone cream  0.5 % Apply 1 application topically 4 (four) times daily. Under nose; 4 times daily (0700,1130,1630,2100)   Yes [provider]  magnesium hydroxide (MILK OF MAGNESIA) 400 MG/5ML suspension Take 30 mLs by mouth daily as needed for mild constipation.   Yes [provider]  nystatin (NYSTATIN) powder Apply 1 Bottle topically 2 (two) times daily as needed.   Yes [provider]  ondansetron (ZOFRAN) 4 MG tablet Take 4 mg by mouth 3 (three) times daily as needed for  nausea or vomiting.   Yes [provider]  pantoprazole (PROTONIX) 40 MG tablet Take 1 tablet (40 mg total) by mouth daily before breakfast. 03/09/17  Yes [name], MD  potassium chloride (K-DUR) 10 MEQ tablet Take 2 tablets (20 mEq total) by mouth daily. Patient taking differently: Take 10 mEq by mouth daily.  10/30/16  Yes [name], MD  sucralfate (CARAFATE) 1 g tablet Take 1 g by mouth 4 (four) times daily. Take 1 tab at 0700, 1 tab at 1130, 1 tab at 1630, 1 tab at 2098/12/28   Yes [provider]    Inpatient Medications:  . aspirin EC  81 mg Oral Daily  . chlorhexidine  15 mL Mouth Rinse BID  . [START ON 05/02/2017] estradiol  1 Applicatorful Vaginal Q M,W,F  . furosemide  20 mg Intravenous Q12H  . hydrocortisone cream  1 application Topical QID  . mouth rinse  15 mL Mouth Rinse q12n4p  . pantoprazole (PROTONIX) IV  40 mg Intravenous Q24H     Allergies:  Allergies  Allergen Reactions  . Codeine     Other reaction(s): Unknown  . Sulfa Antibiotics Other (See Comments)    Deathly sick   . Tape Itching    Other reaction(s): Itching of Skin    Social History   Social History  . Marital status: Widowed    Spouse name: N/A  . Number of children: 5  . Years of education: N/A   Occupational History  . Secretary     Retired   Social History Main Topics  . Smoking status: Former Smoker    Types: Cigarettes  . Smokeless tobacco: Never Used     Comment: quit 1990  . Alcohol use No  . Drug use: No  . Sexual activity: Not on file   Other Topics Concern  . Not on file   Social History Narrative   Widowed 12-29-2003. 5 children-- 1 son died   13 daughters/1 son living      Has living will   Son Joe/daughter Trish--health care POAs   Would accept resuscitation.   No tube feeds if cognitively unaware     Family History  Problem Relation Age of Onset  . Other Father        "brain hemorrhage"  . Melanoma Daughter 22  . Alcoholism Sister   . Alzheimer's  disease Brother   . Alzheimer's disease Sister   . Cancer Sister   . Kidney disease Neg Hx   . Bladder Cancer Neg Hx      Review of Systems Positive for Shortness of breath cough congestion Negative for: General:  chills, fever, night sweats or weight changes.  Cardiovascular: PND orthopnea syncope dizziness  Dermatological skin lesions rashes Respiratory: Positive for Cough congestion Urologic: Frequent urination urination at night and hematuria Abdominal: negative for nausea, vomiting, diarrhea, bright red blood per rectum, melena, or hematemesis Neurologic: negative for visual changes, and/or hearing changes  All other systems reviewed and are  otherwise negative except as noted above.  Labs:  Recent Labs  04/30/17 0920  TROPONINI <0.03   Lab Results  Component Value Date   WBC 9.0 04/30/2017   HGB 9.2 (L) 04/30/2017   HCT 29.7 (L) 04/30/2017   MCV 64.4 (L) 04/30/2017   PLT 333 04/30/2017    Recent Labs Lab 04/30/17 0920 05/01/17 0502  NA 139 141  K 3.2* 3.1*  CL 103 104  CO2 28 28  BUN 15 12  CREATININE 0.81 0.77  CALCIUM 8.6* 8.3*  PROT 6.3*  --   BILITOT 0.7  --   ALKPHOS 121  --   ALT 19  --   AST 27  --   GLUCOSE 110* 91   Lab Results  Component Value Date   CHOL 90 09/01/2016   HDL 30 (L) 09/01/2016   LDLCALC 42 09/01/2016   TRIG 89 09/01/2016   No results found for: DDIMER  Radiology/Studies:  Dg Chest Port 1 View  Result Date: 04/30/2017 CLINICAL DATA:  Acute respiratory distress. Hypoxia. Sepsis. Chronic lymphocytic leukemia. EXAM: PORTABLE CHEST 1 VIEW COMPARISON:  10/31/2016 FINDINGS: Increased cardiac enlargement. Diffuse interstitial and airspace disease is seen bilaterally which is new and likely due diffuse pulmonary edema. Bilateral pleural effusions noted as well as atelectasis or consolidation of both lung bases. IMPRESSION: Diffuse bilateral interstitial and airspace disease with bilateral pleural effusions, likely due to pulmonary  edema/congestive heart failure. Bibasilar atelectasis versus consolidation. Electronically Signed   By: Earle Gell M.D.   On: 04/30/2017 10:06    EKG: Atrial fibrillation with the controlled ventricular rate  Weights: Filed Weights   04/30/17 1225 05/01/17 0400  Weight: 92.6 kg (204 lb 2.3 oz) 88.8 kg (195 lb 12.3 oz)     Physical Exam: Blood pressure 139/74, pulse (!) 107, temperature 98.8 F (37.1 C), temperature source Oral, resp. rate 12, height 5\' 9"  (1.753 m), weight 88.8 kg (195 lb 12.3 oz), SpO2 92 %. Body mass index is 28.91 kg/m. General: Well developed, well nourished, in no acute distress. Head eyes ears nose throat: Normocephalic, atraumatic, sclera non-icteric, no xanthomas, nares are without discharge. No apparent thyromegaly and/or mass  Lungs: Normal respiratory effort.  no wheezes, Basilar rales, no rhonchi.  Heart: RRR with normal S1 S2. no murmur gallop, no rub, PMI is normal size and placement, carotid upstroke normal without bruit, jugular venous pressure is normal Abdomen: Soft, non-tender, non-distended with normoactive bowel sounds. No hepatomegaly. No rebound/guarding. No obvious abdominal masses. Abdominal aorta is normal size without bruit Extremities: Trace to 1+ edema. no cyanosis, no clubbing, no ulcers  Peripheral : 2+ bilateral upper extremity pulses, 2+ bilateral femoral pulses, 2+ bilateral dorsal pedal pulse Neuro: Alert and oriented. No facial asymmetry. No focal deficit. Moves all extremities spontaneously. Musculoskeletal: Normal muscle tone without kyphosis Psych:  Responds to questions appropriately with a normal affect.    Assessment: 83 year old female with chronic nonvalvular atrial fibrillation essential hypertension mixed hyperlipidemia chronic lymphocytic leukemia with acute heart failure of unknown etiology improved with appropriate medication management  Plan: 1. Continue furosemide and changed to oral medication management 2.  Consider addition of beta blocker for further risk reduction of heart failure as well as help with heart rate control of atrial fibrillation 3. Consider repeat echocardiogram for LV systolic dysfunction cause of heart failure and further treatment options 4. Begin ambulation and further treat and adjust medications as necessary  Signed, Corey Skains M.D. [facility] 05/01/2017, 7:26 AM

## 2017-05-01 NOTE — Progress Notes (Signed)
Ordered received for prn duoneb. tx administered to patient. Patient with clear breath sounds and audible airway wheezing. Patient tolerated well. o2 in use via nasal cannula

## 2017-05-01 NOTE — Progress Notes (Signed)
[facility] Progess Note    SYNOPSIS   Victoria Contreras is a very pleasant 83 year old caucasion female, past medical history is remarkable for congestive heart failure, atrial fibrillation,CLL, prior history of stroke, hypertension, hyperlipidemia, anemia, who lives in a skilled nursing facility, was brought into the emergency department today secondary to shortness of breath and hypoxemic respiratory failure. She denies any prodromal fever, chills, night sweats or sputum production.She states that she has been developing lower extremity  Swelling, PND and orthopnea.  ASSESSMENT/PLAN   Atrial fibrillation. Presently on oral amiodarone and Cardizem. Stable hemodynamics at this time, atrial fibrillation is chronic  Congestive heart failure, last measured echocardiogram revealed preserved ejection fraction consistant with Hef-Pef. Diuretics as tolerated. She has diuresed 2-1/2 L and clinicaof Lasix to  Hypokalemia replace with follow-up  Anemia. No evidence of active bleeding, stable  Hemoglobin and hematocrit since prior admission  Discussed with patients daughter patient is a DNR but will pt noninvasive ventilation   INTAKE / OUTPUT:  Intake/Output Summary (Last 24 hours) at 05/01/17 5462 Last data filed at 05/01/17 0600  Gross per 24 hour  Intake              400 ml  Output             2795 ml  Net            -7035 ml    Name: Victoria Contreras MRN: [id number] DOB: 09/06/1925    ADMISSION DATE:  04/30/2017   SUBJECTIVE:   His is recommended spell much better, has weaned off of noninvasive ventilation,stable oxygen saturations and hemodynamics.   VITAL SIGNS: Temp:  [97.7 F (36.5 C)-98.8 F (37.1 C)] 98.8 F (37.1 C) (08/26 0200) Pulse Rate:  [83-119] 107 (08/26 0710) Resp:  [11-25] 12 (08/26 0710) BP: (119-143)/(62-94) 139/74 (08/26 0700) SpO2:  [87 %-100 %] 92 % (08/26 0710) FiO2 (%):  [35 %] 35 % (08/25 1225) Weight:  [88.8 kg (195 lb 12.3  oz)-92.6 kg (204 lb 2.3 oz)] 88.8 kg (195 lb 12.3 oz) (08/26 0400)   PHYSICAL EXAMINATION: Physical Examination:   VS: BP 139/74   Pulse (!) 107   Temp 98.8 F (37.1 C) (Oral)   Resp 12   Ht 5\' 9"  (1.753 m)   Wt 88.8 kg (195 lb 12.3 oz)   SpO2 92%   BMI 28.91 kg/m   General Appearance: No distress  Neuro:without focal findings, mental status normal. Pulmonary: normal breath sounds   Cardiovascular rregularly irregular rhyticular response of 103 Abdomen: Benign, Soft, non-tender. Extremities: 1-2+ edema noted    LABORATORY PANEL:   CBC  Recent Labs Lab 04/30/17 0920  WBC 9.0  HGB 9.2*  HCT 29.7*  PLT 333    Chemistries   Recent Labs Lab 04/30/17 0920 05/01/17 0502  NA 139 141  K 3.2* 3.1*  CL 103 104  CO2 28 28  GLUCOSE 110* 91  BUN 15 12  CREATININE 0.81 0.77  CALCIUM 8.6* 8.3*  AST 27  --   ALT 19  --   ALKPHOS 121  --   BILITOT 0.7  --      Recent Labs Lab 04/30/17 1216  GLUCAP 113*   No results for input(s): PHART, PCO2ART, PO2ART in the last 168 hours.  Recent Labs Lab 04/30/17 0920  AST 27  ALT 19  ALKPHOS 121  BILITOT 0.7  ALBUMIN 3.3*    Cardiac Enzymes  Recent Labs Lab 04/30/17 0920  TROPONINI <0.03  RADIOLOGY:  Dg Chest Port 1 View  Result Date: 04/30/2017 CLINICAL DATA:  Acute respiratory distress. Hypoxia. Sepsis. Chronic lymphocytic leukemia. EXAM: PORTABLE CHEST 1 VIEW COMPARISON:  10/31/2016 FINDINGS: Increased cardiac enlargement. Diffuse interstitial and airspace disease is seen bilaterally which is new and likely due diffuse pulmonary edema. Bilateral pleural effusions noted as well as atelectasis or consolidation of both lung bases. IMPRESSION: Diffuse bilateral interstitial and airspace disease with bilateral pleural effusions, likely due to pulmonary edema/congestive heart failure. Bibasilar atelectasis versus consolidation. Electronically Signed   By: Earle Gell M.D.   On: 04/30/2017 10:06    Hermelinda Dellen, DO 05/01/2017

## 2017-05-01 NOTE — Progress Notes (Signed)
[place] at [place] NAME: Victoria Contreras    MR#:  [id number]  DATE OF BIRTH:  [birth year]  SUBJECTIVE:   Patient here due to respiratory distress and failure secondary to congestive heart failure. Initially he was on BiPAP but now weaned off of it and currently on Ventimask. O2 sats is stable. Patient says shortness of breath has improved since admission. No fever, cough or any other associated symptoms.  REVIEW OF SYSTEMS:    Review of Systems  Constitutional: Negative for chills and fever.  HENT: Negative for congestion and tinnitus.   Eyes: Negative for blurred vision and double vision.  Respiratory: Positive for shortness of breath. Negative for cough and wheezing.   Cardiovascular: Negative for chest pain, orthopnea and PND.  Gastrointestinal: Negative for abdominal pain, diarrhea, nausea and vomiting.  Genitourinary: Negative for dysuria and hematuria.  Neurological: Negative for dizziness, sensory change and focal weakness.  All other systems reviewed and are negative.   Nutrition: Heart Healthy Tolerating Diet: Yes Tolerating PT: Await Eval.    DRUG ALLERGIES:   Allergies  Allergen Reactions  . Codeine     Other reaction(s): Unknown  . Sulfa Antibiotics Other (See Comments)    Deathly sick   . Tape Itching    Other reaction(s): Itching of Skin    VITALS:  Blood pressure (!) 142/85, pulse (!) 109, temperature 98.2 F (36.8 C), temperature source Oral, resp. rate 20, height 5\' 9"  (1.753 m), weight 88.8 kg (195 lb 12.3 oz), SpO2 94 %.  PHYSICAL EXAMINATION:   Physical Exam  GENERAL:  83 y.o.-year-old patient lying in bed on venti mask but in NAD.  EYES: Pupils equal, round, reactive to light and accommodation. No scleral icterus. Extraocular muscles intact.  HEENT: Head atraumatic, normocephalic. Oropharynx and nasopharynx clear.  NECK:  Supple, no jugular venous distention. No thyroid enlargement, no tenderness.   LUNGS: Normal breath sounds bilaterally, no wheezing, bibasilar rales, No rhonchi. No use of accessory muscles of respiration.  CARDIOVASCULAR: S1, S2 normal. No murmurs, rubs, or gallops.  ABDOMEN: Soft, nontender, nondistended. Bowel sounds present. No organomegaly or mass.  EXTREMITIES: No cyanosis, clubbing or edema b/l.    NEUROLOGIC: Cranial nerves II through XII are intact. No focal Motor or sensory deficits b/l.   PSYCHIATRIC: The patient is alert and oriented x 3.  SKIN: No obvious rash, lesion, or ulcer.    LABORATORY PANEL:   CBC  Recent Labs Lab 04/30/17 0920  WBC 9.0  HGB 9.2*  HCT 29.7*  PLT 333   ------------------------------------------------------------------------------------------------------------------  Chemistries   Recent Labs Lab 04/30/17 0920 05/01/17 0502  NA 139 141  K 3.2* 3.1*  CL 103 104  CO2 28 28  GLUCOSE 110* 91  BUN 15 12  CREATININE 0.81 0.77  CALCIUM 8.6* 8.3*  AST 27  --   ALT 19  --   ALKPHOS 121  --   BILITOT 0.7  --    ------------------------------------------------------------------------------------------------------------------  Cardiac Enzymes  Recent Labs Lab 04/30/17 0920  TROPONINI <0.03   ------------------------------------------------------------------------------------------------------------------  RADIOLOGY:  Dg Chest Port 1 View  Result Date: 04/30/2017 CLINICAL DATA:  Acute respiratory distress. Hypoxia. Sepsis. Chronic lymphocytic leukemia. EXAM: PORTABLE CHEST 1 VIEW COMPARISON:  10/31/2016 FINDINGS: Increased cardiac enlargement. Diffuse interstitial and airspace disease is seen bilaterally which is new and likely due diffuse pulmonary edema. Bilateral pleural effusions noted as well as atelectasis or consolidation of both lung bases. IMPRESSION: Diffuse bilateral interstitial and airspace disease  with bilateral pleural effusions, likely due to pulmonary edema/congestive heart failure. Bibasilar  atelectasis versus consolidation. Electronically Signed   By: Earle Gell M.D.   On: 04/30/2017 10:06     ASSESSMENT AND PLAN:   83 year old female with past medical history of CLL, hypertension, hyperlipidemia, history of previous CVA, chronic diastolic heart failure, atrial fibrillation who presented to the hospital due to shortness of breath and noted to be in congestive heart failure.  1. Acute respiratory failure with hypoxia-secondary to decompensated congestive heart failure. -Initially patient was on BiPAP but now weaned off of it. Continue O2 supplementation and weaning as tolerated. Continue IV diuresis for CHF. Improved since yesterday.  2. CHF-acute on chronic diastolic CHF. -Improved with diuresis and BiPAP. Continue IV Lasix, follow I's and O's and daily weights. Patient is about 2.5 L negative since admission.  3. History of chronic atrial fibrillation-cont. Cardizem, Amiodarone.  - rate controlled. Not on long term anti-coagulation due to high fall risk. Cont. ASA - Cardiology following.   4.  Hypokalemia - due to diuresis and will cont. To replace and repeat in a.m.   5. Hx of CVA - cont. ASA  6. History of gout-acute attack. Continue allopurinol.  7. Depression-continue Celexa.  8. GERD-continue Protonix.  All the records are reviewed and case discussed with Care Management/Social Worker. Management plans discussed with the patient, family and they are in agreement.  CODE STATUS: DNR  DVT Prophylaxis: Lovenox  TOTAL TIME TAKING CARE OF THIS PATIENT: 30 minutes.   POSSIBLE D/C IN 1-2 DAYS, DEPENDING ON CLINICAL CONDITION.   Henreitta Leber M.D on 05/01/2017 at 12:08 PM  Between 7am to 6pm - Pager - [phone]  After 6pm go to [email or web address] - Proofreader  [facility]  Office  [phone]  CC: Primary care physician; [name], MD

## 2017-05-02 ENCOUNTER — Inpatient Hospital Stay
Admit: 2017-05-02 | Discharge: 2017-05-02 | Disposition: A | Discharge status: Home / Self Care | Payer: Medicare Other | Attending: Internal Medicine | Admitting: Internal Medicine

## 2017-05-02 ENCOUNTER — Inpatient Hospital Stay: Payer: Medicare Other

## 2017-05-02 LAB — BASIC METABOLIC PANEL
ANION GAP: 9 (ref 5–15)
BUN: 12 mg/dL (ref 6–20)
CHLORIDE: 101 mmol/L (ref 101–111)
CO2: 30 mmol/L (ref 22–32)
Calcium: 8.4 mg/dL — ABNORMAL LOW (ref 8.9–10.3)
Creatinine, Ser: 0.69 mg/dL (ref 0.44–1.00)
GLUCOSE: 104 mg/dL — AB (ref 65–99)
POTASSIUM: 2.9 mmol/L — AB (ref 3.5–5.1)
SODIUM: 140 mmol/L (ref 135–145)

## 2017-05-02 LAB — ECHOCARDIOGRAM COMPLETE
Height: 69 in
Weight: 3289.6 oz

## 2017-05-02 LAB — MAGNESIUM: Magnesium: 1.7 mg/dL (ref 1.7–2.4)

## 2017-05-02 MED ORDER — POTASSIUM CHLORIDE CRYS ER 20 MEQ PO TBCR
20.0000 meq | EXTENDED_RELEASE_TABLET | Freq: Two times a day (BID) | ORAL | Status: DC
  Administered 2017-05-02 – 2017-05-03 (×2): 20 meq via ORAL
  Filled 2017-05-02 (×2): qty 1

## 2017-05-02 MED ORDER — POTASSIUM CHLORIDE CRYS ER 20 MEQ PO TBCR
40.0000 meq | EXTENDED_RELEASE_TABLET | Freq: Once | ORAL | Status: AC
  Administered 2017-05-02: 40 meq via ORAL
  Filled 2017-05-02: qty 2

## 2017-05-02 MED ORDER — IOPAMIDOL (ISOVUE-370) INJECTION 76%
75.0000 mL | Freq: Once | INTRAVENOUS | Status: AC | PRN
  Administered 2017-05-02: 75 mL via INTRAVENOUS

## 2017-05-02 MED ORDER — FUROSEMIDE 10 MG/ML IJ SOLN
20.0000 mg | Freq: Three times a day (TID) | INTRAMUSCULAR | Status: DC
  Administered 2017-05-02 – 2017-05-05 (×8): 20 mg via INTRAVENOUS
  Filled 2017-05-02 (×9): qty 2

## 2017-05-02 NOTE — Progress Notes (Signed)
[name] at [place] NAME: Victoria Contreras    MR#:  [id number]  DATE OF BIRTH:  1924/09/24  SUBJECTIVE:   Patient here due to respiratory distress and failure secondary to congestive heart failure. Weaned off Bipap but O2 requirements went up this a.m. And now on Hif[place]. CTA chest showing no PE but b/l pleural effusions and CHF.  Pt denies any shortness of breath.    REVIEW OF SYSTEMS:    Review of Systems  Constitutional: Negative for chills and fever.  HENT: Negative for congestion and tinnitus.   Eyes: Negative for blurred vision and double vision.  Respiratory: Positive for shortness of breath. Negative for cough and wheezing.   Cardiovascular: Negative for chest pain, orthopnea and PND.  Gastrointestinal: Negative for abdominal pain, diarrhea, nausea and vomiting.  Genitourinary: Negative for dysuria and hematuria.  Neurological: Negative for dizziness, sensory change and focal weakness.  All other systems reviewed and are negative.   Nutrition: Heart Healthy Tolerating Diet: Yes Tolerating PT: Await Eval.    DRUG ALLERGIES:   Allergies  Allergen Reactions  . Codeine     Other reaction(s): Unknown  . Sulfa Antibiotics Other (See Comments)    Deathly sick   . Tape Itching    Other reaction(s): Itching of Skin    VITALS:  Blood pressure 120/79, pulse (!) 126, temperature 98.1 F (36.7 C), resp. rate 20, height 5\' 9"  (1.753 m), weight 93.3 kg (205 lb 9.6 oz), SpO2 97 %.  PHYSICAL EXAMINATION:   Physical Exam  GENERAL:  83 y.o.-year-old patient lying in bed on Hif[name] but in NAD.  EYES: Pupils equal, round, reactive to light and accommodation. No scleral icterus. Extraocular muscles intact.  HEENT: Head atraumatic, normocephalic. Oropharynx and nasopharynx clear.  NECK:  Supple, no jugular venous distention. No thyroid enlargement, no tenderness.  LUNGS: Normal breath sounds bilaterally, no wheezing, bibasilar rales, No  rhonchi. No use of accessory muscles of respiration.  CARDIOVASCULAR: S1, S2 normal. No murmurs, rubs, or gallops.  ABDOMEN: Soft, nontender, nondistended. Bowel sounds present. No organomegaly or mass.  EXTREMITIES: No cyanosis, clubbing or edema b/l.    NEUROLOGIC: Cranial nerves II through XII are intact. No focal Motor or sensory deficits b/l.   PSYCHIATRIC: The patient is alert and oriented x 3.  SKIN: No obvious rash, lesion, or ulcer.    LABORATORY PANEL:   CBC  Recent Labs Lab 04/30/17 0920  WBC 9.0  HGB 9.2*  HCT 29.7*  PLT 333   ------------------------------------------------------------------------------------------------------------------  Chemistries   Recent Labs Lab 04/30/17 0920  05/02/17 0412  NA 139  < > 140  K 3.2*  < > 2.9*  CL 103  < > 101  CO2 28  < > 30  GLUCOSE 110*  < > 104*  BUN 15  < > 12  CREATININE 0.81  < > 0.69  CALCIUM 8.6*  < > 8.4*  AST 27  --   --   ALT 19  --   --   ALKPHOS 121  --   --   BILITOT 0.7  --   --   < > = values in this interval not displayed. ------------------------------------------------------------------------------------------------------------------  Cardiac Enzymes  Recent Labs Lab 04/30/17 0920  TROPONINI <0.03   ------------------------------------------------------------------------------------------------------------------  RADIOLOGY:  Ct Angio Chest Pe W Or Wo Contrast  Result Date: 05/02/2017 CLINICAL DATA:  Chronic dyspnea. EXAM: CT ANGIOGRAPHY CHEST WITH CONTRAST TECHNIQUE: Multidetector CT imaging of the chest  was performed using the standard protocol during bolus administration of intravenous contrast. Multiplanar CT image reconstructions and MIPs were obtained to evaluate the vascular anatomy. CONTRAST:  75 cc Isovue 370 IV COMPARISON:  CXR 05/02/2016, chest CT 10/11/2014 FINDINGS: Cardiovascular: Cardiomegaly with left main and three-vessel coronary arteriosclerosis. No significant pericardial  effusion or thickening. No acute pulmonary embolus to the segmental level. There is aortic atherosclerosis without aneurysm or dissection. Mediastinum/Nodes: No enlarged mediastinal, hilar, or axillary lymph nodes. Small subcentimeter hypodense thyroid nodules, the largest is approximately 8 mm on the right. Lungs/Pleura: Moderate bilateral pleural effusions with compressive atelectasis. There are nonspecific patchy airspace opacities in the upper lobes which may reflect stigmata of CHF or hypoventilatory change although pneumonia is not entirely excluded. Trace fluid in the left major fissure. Upper Abdomen: Cholecystectomy. Hyperdense appearance of the liver parenchyma that can be seen as changes from certain medications such as amiodarone which this patient is reportedly taking on her medication list. Musculoskeletal: Osteopenia without acute osseous abnormality. Review of the MIP images confirms the above findings. IMPRESSION: 1. Moderate bilateral pleural effusions with compressive atelectasis. 2. Minimal nonspecific patchy airspace opacities in the aerated upper lobes may reflect stigmata of CHF or hypoventilatory change. Pneumonia is not entirely excluded. 3. No acute pulmonary embolus. 4. Left main and three-vessel coronary arteriosclerosis with aortic atherosclerosis. Aortic Atherosclerosis (ICD10-I70.0). Electronically Signed   By: Ashley Royalty M.D.   On: 05/02/2017 13:54   Dg Chest Port 1 View  Result Date: 05/02/2017 CLINICAL DATA:  Shortness of breath EXAM: PORTABLE CHEST 1 VIEW COMPARISON:  Chest radiograph 04/30/2017 FINDINGS: Unchanged cardiomegaly with calcific aortic atherosclerosis. Aeration of the lungs is slightly improved. There are persistent bilateral small to medium-sized pleural effusions with associated atelectasis. IMPRESSION: 1. Unchanged pleural effusions with associated atelectasis. 2. Mildly improved lung aeration and decreased pulmonary edema. 3. Unchanged cardiomegaly and  calcific aortic atherosclerosis. Electronically Signed   By: Ulyses Jarred M.D.   On: 05/02/2017 05:12     ASSESSMENT AND PLAN:   83 year old female with past medical history of CLL, hypertension, hyperlipidemia, history of previous CVA, chronic diastolic heart failure, atrial fibrillation who presented to the hospital due to shortness of breath and noted to be in congestive heart failure.  1. Acute respiratory failure with hypoxia-secondary to decompensated congestive heart failure. -Initially patient was on BiPAP but now weaned off of it.  -patient's O2 requirements went up earlier today and patient was placed on high flow nasal cannula. CT scan of the chest obtained which shows no pulmonary embolism but evidence of bilateral pleural effusions and CHF. We'll continue diuresis with Lasix and follow and wean off oxygen as tolerated.  2. CHF-acute on chronic diastolic CHF. - improving w/ diuresis and will cont. To monitor. About 3.5 L (-) since admission.  Continue IV Lasix, follow I's and O's and daily weights.   3. History of chronic atrial fibrillation-cont. Cardizem, Amiodarone.  - rate controlled. Not on long term anti-coagulation due to high fall risk. Cont. ASA - Cardiology following.   4.  Hypokalemia - due to diuresis and will cont. To replace and repeat in a.m.  - check Mg. Level.   5. Hx of CVA - cont. ASA  6. History of gout-acute attack. Continue allopurinol.  7. Depression-continue Celexa.  8. GERD-continue Protonix.  All the records are reviewed and case discussed with Care Management/Social Worker. Management plans discussed with the patient, family and they are in agreement.  CODE STATUS: DNR  DVT Prophylaxis: Lovenox  TOTAL TIME TAKING CARE OF THIS PATIENT: 30 minutes.   POSSIBLE D/C IN 1-2 DAYS, DEPENDING ON CLINICAL CONDITION.   Henreitta Leber M.D on 05/02/2017 at 3:35 PM  Between 7am to 6pm - Pager - [phone]  After 6pm go to [email or web address] -  Proofreader  [facility]  Office  [phone]  CC: Primary care physician; [name], MD

## 2017-05-02 NOTE — Progress Notes (Signed)
Notified MD of O2 @ 87% on 5 liters. Changed to venti mask with sats @ 91%. Orders placed. Will continue to monitort and assess.

## 2017-05-02 NOTE — Care Management (Signed)
RNCM consult received for CHF home health. Patient is long-term care resident at [facility]. RNCM will follow for O2 needs. If PT is appropriate that may help with discharge planning assistance.

## 2017-05-02 NOTE — Progress Notes (Signed)
*  PRELIMINARY RESULTS* Echocardiogram 2D Echocardiogram has been performed.  Sherrie Sport 05/02/2017, 10:19 AM

## 2017-05-02 NOTE — Progress Notes (Signed)
[facility] Card[facility] Encounter Note  Patient: Victoria Contreras / Admit Date: 04/30/2017 / Date of Encounter: 05/02/2017, 8:16 AM   Subjective: Patient improved from admission. Less shortness of breath and weakness. The patient's heart rate better controlled with current medical regimen  Review of Systems: Positive for: Weakness palpitations Negative for: Vision change, hearing change, syncope, dizziness, nausea, vomiting,diarrhea, bloody stool, stomach pain, cough, congestion, diaphoresis, urinary frequency, urinary pain,skin lesions, skin rashes Others previously listed  Objective: Telemetry: Atrial fibrillation controlled ventricular rate Physical Exam: Blood pressure 131/79, pulse (!) 108, temperature 97.9 F (36.6 C), temperature source Oral, resp. rate 18, height 5\' 9"  (1.753 m), weight 93.3 kg (205 lb 9.6 oz), SpO2 92 %. Body mass index is 30.36 kg/m. General: Well developed, well nourished, in no acute distress. Head: Normocephalic, atraumatic, sclera non-icteric, no xanthomas, nares are without discharge. Neck: No apparent masses Lungs: Normal respirations with no wheezes, diffuse rhonchi, no rales , no crackles   Heart: Irregular rate and rhythm, normal S1 S2, no murmur, no rub, no gallop, PMI is normal size and placement, carotid upstroke normal without bruit, jugular venous pressure normal Abdomen: Soft, non-tender, non-distended with normoactive bowel sounds. No hepatosplenomegaly. Abdominal aorta is normal size without bruit Extremities: Trace to 1+ edema, no clubbing, no cyanosis, no ulcers,  Peripheral: 2+ radial, 2+ femoral, 2+ dorsal pedal pulses Neuro: Alert and oriented. Moves all extremities spontaneously. Psych:  Responds to questions appropriately with a normal affect.   Intake/Output Summary (Last 24 hours) at 05/02/17 0816 Last data filed at 05/02/17 0124  Gross per 24 hour  Intake              800 ml  Output             2400 ml  Net             -1600 ml    Inpatient Medications:  . allopurinol  300 mg Oral Daily  . amiodarone  200 mg Oral Daily  . aspirin EC  81 mg Oral Daily  . chlorhexidine  15 mL Mouth Rinse BID  . citalopram  20 mg Oral Daily  . diltiazem  300 mg Oral Daily  . enoxaparin (LOVENOX) injection  40 mg Subcutaneous Q24H  . estradiol  1 Applicatorful Vaginal Q M,W,F  . furosemide  20 mg Intravenous Q12H  . hydrocortisone cream  1 application Topical QID  . mouth rinse  15 mL Mouth Rinse q12n4p  . pantoprazole  40 mg Oral QAC breakfast   Infusions:   Labs:  Recent Labs  05/01/17 0502 05/02/17 0412  NA 141 140  K 3.1* 2.9*  CL 104 101  CO2 28 30  GLUCOSE 91 104*  BUN 12 12  CREATININE 0.77 0.69  CALCIUM 8.3* 8.4*    Recent Labs  04/30/17 0920  AST 27  ALT 19  ALKPHOS 121  BILITOT 0.7  PROT 6.3*  ALBUMIN 3.3*    Recent Labs  04/30/17 0920  WBC 9.0  NEUTROABS 6.2  HGB 9.2*  HCT 29.7*  MCV 64.4*  PLT 333    Recent Labs  04/30/17 0920  TROPONINI <0.03   Invalid input(s): POCBNP No results for input(s): HGBA1C in the last 72 hours.   Weights: Filed Weights   04/30/17 1225 05/01/17 0400 05/02/17 0500  Weight: 92.6 kg (204 lb 2.3 oz) 88.8 kg (195 lb 12.3 oz) 93.3 kg (205 lb 9.6 oz)     Radiology/Studies:  Dg Chest [place]  Result Date: 05/02/2017 CLINICAL DATA:  Shortness of breath EXAM: PORTABLE CHEST 1 VIEW COMPARISON:  Chest radiograph 04/30/2017 FINDINGS: Unchanged cardiomegaly with calcific aortic atherosclerosis. Aeration of the lungs is slightly improved. There are persistent bilateral small to medium-sized pleural effusions with associated atelectasis. IMPRESSION: 1. Unchanged pleural effusions with associated atelectasis. 2. Mildly improved lung aeration and decreased pulmonary edema. 3. Unchanged cardiomegaly and calcific aortic atherosclerosis. Electronically Signed   By: Ulyses Jarred M.D.   On: 05/02/2017 05:12   Dg Chest Port 1 View  Result Date:  04/30/2017 CLINICAL DATA:  Acute respiratory distress. Hypoxia. Sepsis. Chronic lymphocytic leukemia. EXAM: PORTABLE CHEST 1 VIEW COMPARISON:  10/31/2016 FINDINGS: Increased cardiac enlargement. Diffuse interstitial and airspace disease is seen bilaterally which is new and likely due diffuse pulmonary edema. Bilateral pleural effusions noted as well as atelectasis or consolidation of both lung bases. IMPRESSION: Diffuse bilateral interstitial and airspace disease with bilateral pleural effusions, likely due to pulmonary edema/congestive heart failure. Bibasilar atelectasis versus consolidation. Electronically Signed   By: Earle Gell M.D.   On: 04/30/2017 10:06     Assessment and Recommendation  83 y.o. female with the acute on chronic diastolic dysfunction heart failure likely secondary to atrial fibrillation with rapid ventricular rate essential hypertension makes hyperlipidemia as well as infection now slightly improved without evidence of myocardial infarction 1. Continue diltiazem for heart rate control and possible amiodarone for heart rate and rhythm control of atrial fibrillation 2. Furosemide for pulmonary edema and diastolic dysfunction heart failure 3. Continue supportive care of infection primarily causing likely issues this admission 4. Begin ambulation and follow for improvements of symptoms and possible discharged home tomorrow if doing well with follow-up for adjustments of cardiac medication management as an outpatient in one week  Signed, Serafina Royals M.D. FACC

## 2017-05-03 ENCOUNTER — Encounter: Payer: Self-pay | Admitting: *Deleted

## 2017-05-03 LAB — BASIC METABOLIC PANEL
Anion gap: 6 (ref 5–15)
BUN: 10 mg/dL (ref 6–20)
CALCIUM: 8.3 mg/dL — AB (ref 8.9–10.3)
CO2: 33 mmol/L — ABNORMAL HIGH (ref 22–32)
CREATININE: 0.69 mg/dL (ref 0.44–1.00)
Chloride: 101 mmol/L (ref 101–111)
GFR calc non Af Amer: >60 mL/min (ref >60)
Glucose, Bld: 106 mg/dL — ABNORMAL HIGH (ref 65–99)
Potassium: 3.1 mmol/L — ABNORMAL LOW (ref 3.5–5.1)
SODIUM: 140 mmol/L (ref 135–145)

## 2017-05-03 MED ORDER — POTASSIUM CHLORIDE CRYS ER 20 MEQ PO TBCR
20.0000 meq | EXTENDED_RELEASE_TABLET | Freq: Two times a day (BID) | ORAL | Status: AC
  Administered 2017-05-03 – 2017-05-05 (×4): 20 meq via ORAL
  Filled 2017-05-03 (×4): qty 1

## 2017-05-03 NOTE — Care Management Important Message (Signed)
Important Message  Patient Details  Name: Victoria Contreras MRN: [id number] Date of Birth: 02-Apr-1925   Medicare Important Message Given:  Yes    Marshell Garfinkel, RN 05/03/2017, 8:26 AM

## 2017-05-03 NOTE — Progress Notes (Signed)
[facility] [place] NAME: Victoria Contreras    MR#:  [id number]  DATE OF BIRTH:  May 12, 1925  SUBJECTIVE:   Patient here due to respiratory distress and failure secondary to congestive heart failure.  No shortness of breath.  Weaned off Hiflo [place] and now on 6L and will cont. To wean as tolerated.  No other acute events overnight.   REVIEW OF SYSTEMS:    Review of Systems  Constitutional: Negative for chills and fever.  HENT: Negative for congestion and tinnitus.   Eyes: Negative for blurred vision and double vision.  Respiratory: Negative for cough, shortness of breath and wheezing.   Cardiovascular: Negative for chest pain, orthopnea and PND.  Gastrointestinal: Negative for abdominal pain, diarrhea, nausea and vomiting.  Genitourinary: Negative for dysuria and hematuria.  Neurological: Negative for dizziness, sensory change and focal weakness.  All other systems reviewed and are negative.   Nutrition: Heart Healthy Tolerating Diet: Yes Tolerating PT: Await Eval.    DRUG ALLERGIES:   Allergies  Allergen Reactions  . Codeine     Other reaction(s): Unknown  . Sulfa Antibiotics Other (See Comments)    Deathly sick   . Tape Itching    Other reaction(s): Itching of Skin    VITALS:  Blood pressure 130/78, pulse (!) 117, temperature 98.3 F (36.8 C), resp. rate 20, height 5\' 9"  (1.753 m), weight 88.6 kg (195 lb 4.8 oz), SpO2 91 %.  PHYSICAL EXAMINATION:   Physical Exam  GENERAL:  83 y.o.-year-old patient lying in bed on Hiflo [place] but in NAD.  EYES: Pupils equal, round, reactive to light and accommodation. No scleral icterus. Extraocular muscles intact.  HEENT: Head atraumatic, normocephalic. Oropharynx and nasopharynx clear.  NECK:  Supple, no jugular venous distention. No thyroid enlargement, no tenderness.  LUNGS: Normal breath sounds bilaterally, no wheezing, bibasilar rales, No rhonchi. No use of accessory muscles of respiration.   CARDIOVASCULAR: S1, S2 normal. No murmurs, rubs, or gallops.  ABDOMEN: Soft, nontender, nondistended. Bowel sounds present. No organomegaly or mass.  EXTREMITIES: No cyanosis, clubbing or edema b/l.    NEUROLOGIC: Cranial nerves II through XII are intact. No focal Motor or sensory deficits b/l.  Globally weak.  PSYCHIATRIC: The patient is alert and oriented x 3.  SKIN: No obvious rash, lesion, or ulcer.    LABORATORY PANEL:   CBC  Recent Labs Lab 04/30/17 0920  WBC 9.0  HGB 9.2*  HCT 29.7*  PLT 333   ------------------------------------------------------------------------------------------------------------------  Chemistries   Recent Labs Lab 04/30/17 0920  05/02/17 1754 05/03/17 0436  NA 139  < >  --  140  K 3.2*  < >  --  3.1*  CL 103  < >  --  101  CO2 28  < >  --  33*  GLUCOSE 110*  < >  --  106*  BUN 15  < >  --  10  CREATININE 0.81  < >  --  0.69  CALCIUM 8.6*  < >  --  8.3*  MG  --   --  1.7  --   AST 27  --   --   --   ALT 19  --   --   --   ALKPHOS 121  --   --   --   BILITOT 0.7  --   --   --   < > = values in this interval not displayed. ------------------------------------------------------------------------------------------------------------------  Cardiac Enzymes  Recent  Labs Lab 04/30/17 0920  TROPONINI <0.03   ------------------------------------------------------------------------------------------------------------------  RADIOLOGY:  Ct Angio Chest Pe W Or Wo Contrast  Result Date: 05/02/2017 CLINICAL DATA:  Chronic dyspnea. EXAM: CT ANGIOGRAPHY CHEST WITH CONTRAST TECHNIQUE: Multidetector CT imaging of the chest was performed using the standard protocol during bolus administration of intravenous contrast. Multiplanar CT image reconstructions and MIPs were obtained to evaluate the vascular anatomy. CONTRAST:  75 cc Isovue 370 IV COMPARISON:  CXR 05/02/2016, chest CT 10/11/2014 FINDINGS: Cardiovascular: Cardiomegaly with left main and  three-vessel coronary arteriosclerosis. No significant pericardial effusion or thickening. No acute pulmonary embolus to the segmental level. There is aortic atherosclerosis without aneurysm or dissection. Mediastinum/Nodes: No enlarged mediastinal, hilar, or axillary lymph nodes. Small subcentimeter hypodense thyroid nodules, the largest is approximately 8 mm on the right. Lungs/Pleura: Moderate bilateral pleural effusions with compressive atelectasis. There are nonspecific patchy airspace opacities in the upper lobes which may reflect stigmata of CHF or hypoventilatory change although pneumonia is not entirely excluded. Trace fluid in the left major fissure. Upper Abdomen: Cholecystectomy. Hyperdense appearance of the liver parenchyma that can be seen as changes from certain medications such as amiodarone which this patient is reportedly taking on her medication list. Musculoskeletal: Osteopenia without acute osseous abnormality. Review of the MIP images confirms the above findings. IMPRESSION: 1. Moderate bilateral pleural effusions with compressive atelectasis. 2. Minimal nonspecific patchy airspace opacities in the aerated upper lobes may reflect stigmata of CHF or hypoventilatory change. Pneumonia is not entirely excluded. 3. No acute pulmonary embolus. 4. Left main and three-vessel coronary arteriosclerosis with aortic atherosclerosis. Aortic Atherosclerosis (ICD10-I70.0). Electronically Signed   By: Ashley Royalty M.D.   On: 05/02/2017 13:54   Dg Chest Port 1 View  Result Date: 05/02/2017 CLINICAL DATA:  Shortness of breath EXAM: PORTABLE CHEST 1 VIEW COMPARISON:  Chest radiograph 04/30/2017 FINDINGS: Unchanged cardiomegaly with calcific aortic atherosclerosis. Aeration of the lungs is slightly improved. There are persistent bilateral small to medium-sized pleural effusions with associated atelectasis. IMPRESSION: 1. Unchanged pleural effusions with associated atelectasis. 2. Mildly improved lung aeration  and decreased pulmonary edema. 3. Unchanged cardiomegaly and calcific aortic atherosclerosis. Electronically Signed   By: Ulyses Jarred M.D.   On: 05/02/2017 05:12     ASSESSMENT AND PLAN:   83 year old female with past medical history of CLL, hypertension, hyperlipidemia, history of previous CVA, chronic diastolic heart failure, atrial fibrillation who presented to the hospital due to shortness of breath and noted to be in congestive heart failure.  1. Acute respiratory failure with hypoxia-secondary to decompensated congestive heart failure. -Initially patient was on BiPAP but weaned off it to Hiflo [place] and now weaned to 6 L [place].    -CT chest obtained yesterday showing no evidence of pulmonary embolism but bilateral pleural effusions and vascular congestion consistent with heart failure. Continue aggressive diuresis and wean off oxygen as tolerated. -keep O2 sats 90% or higher.  2. CHF-acute on chronic diastolic CHF. - improving w/ diuresis and will cont. To monitor. About 4.6 L (-) since admission.  - Continue IV Lasix, follow I's and O's and daily weights and pt. Is improving.   3. History of chronic atrial fibrillation-cont. Cardizem, Amiodarone.  - rate controlled. Not on long term anti-coagulation due to high fall risk. Cont. ASA - Cardiology following.   4.  Hypokalemia - due to diuresis and cont. To replace.  Mg. Level normal.    5. Hx of CVA - cont. ASA  6. History of gout- No acute attack.  Continue allopurinol.  7. Depression-continue Celexa.  8. GERD-continue Protonix.  All the records are reviewed and case discussed with Care Management/Social Worker. Management plans discussed with the patient, family and they are in agreement.  CODE STATUS: DNR  DVT Prophylaxis: Lovenox  TOTAL TIME TAKING CARE OF THIS PATIENT: 30 minutes.   POSSIBLE D/C IN 2-3 DAYS, DEPENDING ON CLINICAL CONDITION.   Henreitta Leber M.D on 05/03/2017 at 2:39 PM  Between 7am to 6pm - Pager -  [phone]  After 6pm go to [email or web address] - Proofreader  [facility]  Office  [phone]  CC: Primary care physician; [name], MD R

## 2017-05-04 LAB — BASIC METABOLIC PANEL
ANION GAP: 8 (ref 5–15)
BUN: 11 mg/dL (ref 6–20)
CO2: 32 mmol/L (ref 22–32)
CREATININE: 0.52 mg/dL (ref 0.44–1.00)
Calcium: 8.2 mg/dL — ABNORMAL LOW (ref 8.9–10.3)
Chloride: 99 mmol/L — ABNORMAL LOW (ref 101–111)
GFR calc Af Amer: >60 mL/min (ref >60)
GLUCOSE: 102 mg/dL — AB (ref 65–99)
POTASSIUM: 3.5 mmol/L (ref 3.5–5.1)
Sodium: 139 mmol/L (ref 135–145)

## 2017-05-04 MED ORDER — DOCUSATE SODIUM 100 MG PO CAPS
100.0000 mg | ORAL_CAPSULE | Freq: Two times a day (BID) | ORAL | Status: DC
  Administered 2017-05-04 – 2017-05-10 (×10): 100 mg via ORAL
  Filled 2017-05-04 (×12): qty 1

## 2017-05-04 NOTE — Care Management (Addendum)
Clarified with Lawerance Sabal from [facility] that patient is a resident in the assisted living level of care and not long term care.  Patient is currently requiring nonrebreather at 15 liters to maintain her oxygen sats at 93%. Was weaned off high flow yesterday to 6 liters.  Bipap added at bedtime.  Physical therapy consult is pending

## 2017-05-04 NOTE — Evaluation (Signed)
Physical Therapy Evaluation Patient Details Name: Victoria Contreras MRN: [id number] DOB: [birth year] Today's Date: 05/04/2017   History of Present Illness  Pt is a 83 y/o F who presented with severe SOB.  Pt was placed on BiPAP and admitted for respiratory distress and failure secondary to CHF.  Pt's PMH includes a-fib, CHF, CVA, gout, mood disorder, R TKA.    Clinical Impression  Pt admitted with above diagnosis. Pt currently with functional limitations due to the deficits listed below (see PT Problem List). Victoria Contreras presents on HFNC, thus limiting mobility this session. SpO2 remains at or above 93% on HFNC throughout entire session.  Pt denies and does not demonstrate SOB.  Pt currently requires mod assist to boost to standing and close min guard assist to pivot to the chair with RW.  Given pt's current mobility status, recommending SNF at d/c.  Pt will benefit from skilled PT to increase their independence and safety with mobility to allow discharge to the venue listed below.      Follow Up Recommendations SNF    Equipment Recommendations  None recommended by PT    Recommendations for Other Services       Precautions / Restrictions Precautions Precautions: Fall;Other (comment) Precaution Comments: Pt currently on HFNC Restrictions Weight Bearing Restrictions: No      Mobility  Bed Mobility Overal bed mobility: Needs Assistance Bed Mobility: Supine to Sit     Supine to sit: Min guard;HOB elevated     General bed mobility comments: Max increased time and effort with cues to scoot to EOB.    Transfers Overall transfer level: Needs assistance Equipment used: Rolling walker (2 wheeled) Transfers: Sit to/from [facility] Sit to Stand: Mod assist Stand pivot transfers: Min guard       General transfer comment: Mod assist to boost to standing.  Cues for proper hand placement and safe technique.  Pt does not reach back for armrests when preparing to sit.  Pt  pivots slowly with close min guard as pt demonstrates mild instability but no LOB.    Ambulation/Gait             General Gait Details: Unable to assess as pt currently on HFNC  Stairs            Wheelchair Mobility    Modified Rankin (Stroke Patients Only)       Balance Overall balance assessment: Needs assistance Sitting-balance support: No upper extremity supported;Feet supported Sitting balance-Leahy Scale: Good     Standing balance support: Bilateral upper extremity supported;During functional activity Standing balance-Leahy Scale: Poor Standing balance comment: Relies on UE support for static and dynamic activities                             Pertinent Vitals/Pain Pain Assessment: No/denies pain    Home Living Family/patient expects to be discharged to:: Assisted living               Home Equipment: Walker - 4 wheels;Shower seat;Grab bars - toilet;Grab bars - tub/shower;Hand held shower head;Wheelchair - manual      Prior Function Level of Independence: Needs assistance   Gait / Transfers Assistance Needed: Pt ambulates within her ALF apartment with rollator but uses WC for longer distances which she self propels with her LEs.  No falls in the past 6 months.  ADL's / Homemaking Assistance Needed: Requires assist for bathing, pt sits on shower seat to shower.  Independent with dressing.  No longer driving.  Self propels WC to cafeteria for meals.         Hand Dominance        Extremity/Trunk Assessment   Upper Extremity Assessment Upper Extremity Assessment:  (BUE strength grossly 3+.5)    Lower Extremity Assessment Lower Extremity Assessment:  (BLE strength grossly 4-/5)       Communication   Communication: No difficulties  Cognition Arousal/Alertness: Awake/alert Behavior During Therapy: WFL for tasks assessed/performed Overall Cognitive Status: Within Functional Limits for tasks assessed                                         General Comments General comments (skin integrity, edema, etc.): SpO2 remains at or above 93% on HFNC throughout entire session.  Pt denies and does not demonstrate SOB.      Exercises General Exercises - Lower Extremity Ankle Circles/Pumps: AROM;Both;15 reps;Supine;Other (comment) (Encouraged pt to perform throughout the day) Other Exercises Other Exercises: Encouraged pt to practice upright posture in sitting and standing throughout the day for improved posture and pulmonary function Other Exercises: Demonstrated to pt and pt demonstrated back pursed lip breathing to continue practicing throughout the day for improved pulmonary function Other Exercises: Encouraged pt to keep LEs elevated on pillow for decreased swelling   Assessment/Plan    PT Assessment Patient needs continued PT services  PT Problem List Decreased strength;Decreased activity tolerance;Decreased balance;Decreased mobility;Decreased knowledge of use of DME;Decreased safety awareness;Cardiopulmonary status limiting activity       PT Treatment Interventions DME instruction;Gait training;Functional mobility training;Therapeutic activities;Therapeutic exercise;Balance training;Neuromuscular re-education;Other (comment);Patient/family education (Energy conservation techniques)    PT Goals (Current goals can be found in the Care Plan section)  Acute Rehab PT Goals Patient Stated Goal: to return to PLOF PT Goal Formulation: With patient Time For Goal Achievement: 05/18/17 Potential to Achieve Goals: Good    Frequency Min 2X/week   Barriers to discharge Decreased caregiver support Lives alone    Co-evaluation               AM-PAC PT "6 Clicks" Daily Activity  Outcome Measure Difficulty turning over in bed (including adjusting bedclothes, sheets and blankets)?: A Lot Difficulty moving from lying on back to sitting on the side of the bed? : A Lot Difficulty sitting down on and standing  up from a chair with arms (e.g., wheelchair, bedside commode, etc,.)?: Unable Help needed moving to and from a bed to chair (including a wheelchair)?: A Little Help needed walking in hospital room?: A Lot Help needed climbing 3-5 steps with a railing? : A Lot 6 Click Score: 12    End of Session Equipment Utilized During Treatment: Gait belt;Oxygen (on HFNC) Activity Tolerance: Other (comment);Patient tolerated treatment well (limited by length of HFNC ) Patient left: in chair;with call bell/phone within reach;with chair alarm set;Other (comment) (with BLEs elevated on pillow) Nurse Communication: Mobility status;Other (comment) (SpO2, pt's DNR bracelet off in bed) PT Visit Diagnosis: Unsteadiness on feet (R26.81);Muscle weakness (generalized) (M62.81);Difficulty in walking, not elsewhere classified (R26.2)    Time: 0277-4128 PT Time Calculation (min) (ACUTE ONLY): 33 min   Charges:   PT Evaluation $PT Eval Low Complexity: 1 Low PT Treatments $Therapeutic Activity: 8-22 mins   PT G Codes:   PT G-Codes **NOT FOR INPATIENT CLASS** Functional Assessment Tool Used: AM-PAC 6 Clicks Basic Mobility;Clinical judgement Functional Limitation: Mobility: Walking  and moving around Mobility: Walking and Moving Around Current Status [phone]): At least 60 percent but less than 80 percent impaired, limited or restricted Mobility: Walking and Moving Around Goal Status [phone]): At least 20 percent but less than 40 percent impaired, limited or restricted    Collie Siad PT, DPT 05/04/2017, 12:09 PM

## 2017-05-04 NOTE — Progress Notes (Signed)
[place] [facility] [place] NAME: Victoria Contreras    MR#:  [id number]  DATE OF BIRTH:  Feb 22, 1925  SUBJECTIVE:   Patient here due to respiratory distress and failure secondary to congestive heart failure.  Back on Hiflo [place] now.  Dose not complain of any shortness of breath.  Seen by PT today and they recommend SNF/STR   REVIEW OF SYSTEMS:    Review of Systems  Constitutional: Negative for chills and fever.  HENT: Negative for congestion and tinnitus.   Eyes: Negative for blurred vision and double vision.  Respiratory: Negative for cough, shortness of breath and wheezing.   Cardiovascular: Negative for chest pain, orthopnea and PND.  Gastrointestinal: Negative for abdominal pain, diarrhea, nausea and vomiting.  Genitourinary: Negative for dysuria and hematuria.  Neurological: Negative for dizziness, sensory change and focal weakness.  All other systems reviewed and are negative.   Nutrition: Heart Healthy Tolerating Diet: Yes Tolerating PT: Eval noted.    DRUG ALLERGIES:   Allergies  Allergen Reactions  . Codeine     Other reaction(s): Unknown  . Sulfa Antibiotics Other (See Comments)    Deathly sick   . Tape Itching    Other reaction(s): Itching of Skin    VITALS:  Blood pressure 122/77, pulse (!) 110, temperature (!) 97.5 F (36.4 C), temperature source Oral, resp. rate 18, height 5\' 9"  (1.753 m), weight 90.9 kg (200 lb 6.4 oz), SpO2 95 %.  PHYSICAL EXAMINATION:   Physical Exam  GENERAL:  83 y.o.-year-old patient lying in bed on Hiflo [place] but in NAD.  EYES: Pupils equal, round, reactive to light and accommodation. No scleral icterus. Extraocular muscles intact.  HEENT: Head atraumatic, normocephalic. Oropharynx and nasopharynx clear.  NECK:  Supple, no jugular venous distention. No thyroid enlargement, no tenderness.  LUNGS: Normal breath sounds bilaterally, no wheezing, minimal bibasilar rales, No rhonchi. No use of accessory  muscles of respiration.  CARDIOVASCULAR: S1, S2 normal. No murmurs, rubs, or gallops.  ABDOMEN: Soft, nontender, nondistended. Bowel sounds present. No organomegaly or mass.  EXTREMITIES: No cyanosis, clubbing or edema b/l.    NEUROLOGIC: Cranial nerves II through XII are intact. No focal Motor or sensory deficits b/l.  Globally weak.  PSYCHIATRIC: The patient is alert and oriented x 3.  SKIN: No obvious rash, lesion, or ulcer.    LABORATORY PANEL:   CBC  Recent Labs Lab 04/30/17 0920  WBC 9.0  HGB 9.2*  HCT 29.7*  PLT 333   ------------------------------------------------------------------------------------------------------------------  Chemistries   Recent Labs Lab 04/30/17 0920  05/02/17 1754  05/04/17 0359  NA 139  < >  --   < > 139  K 3.2*  < >  --   < > 3.5  CL 103  < >  --   < > 99*  CO2 28  < >  --   < > 32  GLUCOSE 110*  < >  --   < > 102*  BUN 15  < >  --   < > 11  CREATININE 0.81  < >  --   < > 0.52  CALCIUM 8.6*  < >  --   < > 8.2*  MG  --   --  1.7  --   --   AST 27  --   --   --   --   ALT 19  --   --   --   --   ALKPHOS 121  --   --   --   --  BILITOT 0.7  --   --   --   --   < > = values in this interval not displayed. ------------------------------------------------------------------------------------------------------------------  Cardiac Enzymes  Recent Labs Lab 04/30/17 0920  TROPONINI <0.03   ------------------------------------------------------------------------------------------------------------------  RADIOLOGY:  Ct Angio Chest Pe W Or Wo Contrast  Result Date: 05/02/2017 CLINICAL DATA:  Chronic dyspnea. EXAM: CT ANGIOGRAPHY CHEST WITH CONTRAST TECHNIQUE: Multidetector CT imaging of the chest was performed using the standard protocol during bolus administration of intravenous contrast. Multiplanar CT image reconstructions and MIPs were obtained to evaluate the vascular anatomy. CONTRAST:  75 cc Isovue 370 IV COMPARISON:  CXR  05/02/2016, chest CT 10/11/2014 FINDINGS: Cardiovascular: Cardiomegaly with left main and three-vessel coronary arteriosclerosis. No significant pericardial effusion or thickening. No acute pulmonary embolus to the segmental level. There is aortic atherosclerosis without aneurysm or dissection. Mediastinum/Nodes: No enlarged mediastinal, hilar, or axillary lymph nodes. Small subcentimeter hypodense thyroid nodules, the largest is approximately 8 mm on the right. Lungs/Pleura: Moderate bilateral pleural effusions with compressive atelectasis. There are nonspecific patchy airspace opacities in the upper lobes which may reflect stigmata of CHF or hypoventilatory change although pneumonia is not entirely excluded. Trace fluid in the left major fissure. Upper Abdomen: Cholecystectomy. Hyperdense appearance of the liver parenchyma that can be seen as changes from certain medications such as amiodarone which this patient is reportedly taking on her medication list. Musculoskeletal: Osteopenia without acute osseous abnormality. Review of the MIP images confirms the above findings. IMPRESSION: 1. Moderate bilateral pleural effusions with compressive atelectasis. 2. Minimal nonspecific patchy airspace opacities in the aerated upper lobes may reflect stigmata of CHF or hypoventilatory change. Pneumonia is not entirely excluded. 3. No acute pulmonary embolus. 4. Left main and three-vessel coronary arteriosclerosis with aortic atherosclerosis. Aortic Atherosclerosis (ICD10-I70.0). Electronically Signed   By: Ashley Royalty M.D.   On: 05/02/2017 13:54     ASSESSMENT AND PLAN:   83 year old female with past medical history of CLL, hypertension, hyperlipidemia, history of previous CVA, chronic diastolic heart failure, atrial fibrillation who presented to the hospital due to shortness of breath and noted to be in congestive heart failure.  1. Acute respiratory failure with hypoxia-secondary to decompensated congestive heart  failure. -Initially patient was on BiPAP but weaned off it and was on Hiflo and weaned off that to [facility] yesterday but back on Hiflo again today and will cont. To wean.   -CT chest obtained showing no evidence of pulmonary embolism but bilateral pleural effusions and vascular congestion consistent with heart failure. Continue aggressive diuresis and wean off oxygen as tolerated. -keep O2 sats 90% or higher.  2. CHF-acute on chronic diastolic CHF. - improving w/ diuresis and will cont. To monitor. About 6.6 L (-) since admission.  - Continue IV Lasix, follow I's and O's and daily weights and pt. Is improving.  3. History of chronic atrial fibrillation-cont. Cardizem, Amiodarone.  - rate controlled. Not on long term anti-coagulation due to high fall risk. Cont. ASA - Cardiology following.   4.  Hypokalemia - due to diuresis and improved w/ supplementation.  Mg. Level normal.    5. Hx of CVA - cont. ASA  6. History of gout- No acute attack. Continue allopurinol.  7. Depression-continue Celexa.  8. GERD-continue Protonix.  Updated pt's Son over the phone on pt's plan of care.    All the records are reviewed and case discussed with Care Management/Social Worker. Management plans discussed with the patient, family and they are in agreement.  CODE STATUS:  DNR  DVT Prophylaxis: Lovenox  TOTAL TIME TAKING CARE OF THIS PATIENT: 30 minutes.   POSSIBLE D/C IN 2-3 DAYS, DEPENDING ON CLINICAL CONDITION.   Henreitta Leber M.D on 05/04/2017 at 12:54 PM  Between 7am to 6pm - Pager - [phone]  After 6pm go to [email or web address] - Proofreader  [facility]  Office  [phone]  CC: Primary care physician; [name], MD R

## 2017-05-04 NOTE — Progress Notes (Signed)
Patient in no distress at this time. Patient sound asleep but arouses easily. HFNC in use. Vitals stable. Patient states her breathing is fine. She did not wish to use bipap mask at this time. bipap on sb. Will continue to monitor

## 2017-05-04 NOTE — Progress Notes (Signed)
Patient's SpO2 85-88% on 6L [place]. Non-rebreather mask applied with 15L O2 and SpO2 up to 93%. Patient noted to be breathing through mouth instead of nose while sleeping. Notified MD Marcille Blanco and order placed for patient to be started on BiPAP at bedtime. Nursing staff will continue to monitor for any changes in patient status. Earleen Reaper, RN

## 2017-05-04 NOTE — Clinical Social Work Note (Signed)
CSW spoke to [facility] and they will accept patient into SNF for short term rehab before she is able to return to the ALF.  CSW to continue to follow patient's progress throughout discharge planning.  Jones Broom. [place], MSW, [place]  05/04/2017 12:54 PM

## 2017-05-05 ENCOUNTER — Inpatient Hospital Stay: Payer: Medicare Other

## 2017-05-05 DIAGNOSIS — J9621 Acute and chronic respiratory failure with hypoxia: Secondary | ICD-10-CM

## 2017-05-05 LAB — CULTURE, BLOOD (ROUTINE X 2)
CULTURE: NO GROWTH
Culture: NO GROWTH
SPECIAL REQUESTS: ADEQUATE
SPECIAL REQUESTS: ADEQUATE

## 2017-05-05 LAB — CBC
HCT: 28.4 % — ABNORMAL LOW (ref 35.0–47.0)
Hemoglobin: 8.7 g/dL — ABNORMAL LOW (ref 12.0–16.0)
MCH: 20.1 pg — ABNORMAL LOW (ref 26.0–34.0)
MCHC: 30.7 g/dL — AB (ref 32.0–36.0)
MCV: 65.3 fL — ABNORMAL LOW (ref 80.0–100.0)
Platelets: 244 10*3/uL (ref 150–440)
RBC: 4.34 MIL/uL (ref 3.80–5.20)
RDW: 26.1 % — AB (ref 11.5–14.5)
WBC: 8.5 10*3/uL (ref 3.6–11.0)

## 2017-05-05 LAB — BASIC METABOLIC PANEL
ANION GAP: 8 (ref 5–15)
BUN: 10 mg/dL (ref 6–20)
CALCIUM: 8.4 mg/dL — AB (ref 8.9–10.3)
CO2: 33 mmol/L — ABNORMAL HIGH (ref 22–32)
Chloride: 97 mmol/L — ABNORMAL LOW (ref 101–111)
Creatinine, Ser: 0.66 mg/dL (ref 0.44–1.00)
GFR calc Af Amer: >60 mL/min (ref >60)
GLUCOSE: 98 mg/dL (ref 65–99)
Potassium: 3.4 mmol/L — ABNORMAL LOW (ref 3.5–5.1)
Sodium: 138 mmol/L (ref 135–145)

## 2017-05-05 LAB — GLUCOSE, PLEURAL OR PERITONEAL FLUID: GLUCOSE FL: 98 mg/dL

## 2017-05-05 LAB — PROTEIN, PLEURAL OR PERITONEAL FLUID: Total protein, fluid: <3 g/dL

## 2017-05-05 LAB — BODY FLUID CELL COUNT WITH DIFFERENTIAL
EOS FL: 0 %
LYMPHS FL: 0 %
Monocyte-Macrophage-Serous Fluid: 95 %
NEUTROPHIL FLUID: 5 %
Total Nucleated Cell Count, Fluid: 616 cu mm

## 2017-05-05 LAB — MAGNESIUM: MAGNESIUM: 1.6 mg/dL — AB (ref 1.7–2.4)

## 2017-05-05 LAB — PROTIME-INR
INR: 1.12
PROTHROMBIN TIME: 14.3 s (ref 11.4–15.2)

## 2017-05-05 LAB — APTT: aPTT: 34 seconds (ref 24–36)

## 2017-05-05 MED ORDER — MAGNESIUM SULFATE 2 GM/50ML IV SOLN
2.0000 g | Freq: Once | INTRAVENOUS | Status: AC
  Administered 2017-05-05: 2 g via INTRAVENOUS
  Filled 2017-05-05: qty 50

## 2017-05-05 MED ORDER — FUROSEMIDE 10 MG/ML IJ SOLN: 20.0000 mg | Freq: Two times a day (BID) | INTRAMUSCULAR | Status: DC

## 2017-05-05 MED ORDER — FUROSEMIDE 10 MG/ML IJ SOLN
20.0000 mg | Freq: Two times a day (BID) | INTRAMUSCULAR | Status: DC
  Administered 2017-05-05 – 2017-05-07 (×4): 20 mg via INTRAVENOUS
  Filled 2017-05-05 (×4): qty 2

## 2017-05-05 MED ORDER — POLYETHYLENE GLYCOL 3350 17 G PO PACK
17.0000 g | PACK | Freq: Every day | ORAL | Status: DC
  Administered 2017-05-05 – 2017-05-10 (×5): 17 g via ORAL
  Filled 2017-05-05 (×6): qty 1

## 2017-05-05 NOTE — Progress Notes (Signed)
PT Cancellation Note  Patient Details Name: Victoria Contreras MRN: [id number] DOB: [birth year]   Cancelled Treatment:    Reason Eval/Treat Not Completed: Patient at procedure or test/unavailable.  Pt currently off floor for thoracentesis.  Will continue to follow acutely.   Collie Siad PT, DPT 05/05/2017, 2:24 PM

## 2017-05-05 NOTE — Progress Notes (Addendum)
Name: Victoria Contreras MRN: [id number] DOB: 03-10-1925    ADMISSION DATE:  04/30/2017   HISTORY OF PRESENT ILLNESS:   83 year old caucasion female, past medical history is remarkable for congestive heart failure, atrial fibrillation,CLL, prior history of stroke, hypertension, hyperlipidemia, anemia, who lives in a skilled nursing facility, was brought into the emergency department 08/25 secondary to shortness of breath and hypoxemic respiratory failure secondary to CHF exacerbation and bilateral pleural effusions.  She was admitted to the stepdown unit for continuous Bipap and transferred to the telemetry unit 08/26 and PCCM signed off the case.  On 08/30 PCCM asked to see the pt again due to persistent hypoxia requiring HFNC despite aggressive diuresis.  STUDIES:  08/25-Pt admitted to the stepdown unit 08/26-Pt transferred to telemetry unit  08/30-PCCM asked to see the pt again due to persistent hypoxia   REVIEW OF SYSTEMS: Positives in BOLD  Constitutional: Negative for fever, chills, weight loss, malaise/fatigue and diaphoresis.  HENT: Negative for hearing loss, ear pain, nosebleeds, congestion, sore throat, neck pain, tinnitus and ear discharge.  Eyes: Negative for blurred vision, double vision, photophobia, pain, discharge and redness.  Respiratory: Negative for cough, hemoptysis, sputum production, shortness of breath, wheezing and stridor.   Cardiovascular: Negative for chest pain, palpitations, orthopnea, claudication, leg swelling and PND.  Gastrointestinal: Negative for heartburn, nausea, vomiting, abdominal pain, diarrhea, constipation, blood in stool and melena.  Genitourinary: Negative for dysuria, urgency, frequency, hematuria and flank pain.  Musculoskeletal: Negative for myalgias, back pain, joint pain and falls.  Skin: Negative for itching and rash.  Neurological: Negative for dizziness, tingling, tremors, sensory change, speech change, focal weakness, seizures, loss of  consciousness, weakness and headaches.  Endo/Heme/Allergies: Negative for environmental allergies and polydipsia. Does not bruise/bleed easily.  SUBJECTIVE:  Pt resting comfortably in bed no complaints   VITAL SIGNS: Temp:  [97.8 F (36.6 C)] 97.8 F (36.6 C) (08/30 0359) Pulse Rate:  [106-111] 108 (08/30 0903) Resp:  [18] 18 (08/30 0359) BP: (106-120)/(61-71) 106/71 (08/30 0903) SpO2:  [88 %-93 %] 90 % (08/30 0400) FiO2 (%):  [60 %] 60 % (08/29 2244) Weight:  [89.2 kg (196 lb 11.2 oz)] 89.2 kg (196 lb 11.2 oz) (08/30 0359)  PHYSICAL EXAMINATION: General: well developed, well nourished Caucasian female, NAD  Neuro: alert and oriented, follows commands resting tremor HEENT: supple, no JVD Cardiovascular: irregular irregular, no M/R/G Lungs: faint rhonchi throughout diminished bilateral bases, even, non labored on HFNC Abdomen: +BX x4, soft, non tender, non distended  Musculoskeletal: normal bulk and tone Skin: intact no rashes or lesions    Recent Labs Lab 05/03/17 0436 05/04/17 0359 05/05/17 0551  NA 140 139 138  K 3.1* 3.5 3.4*  CL 101 99* 97*  CO2 33* 32 33*  BUN 10 11 10   CREATININE 0.69 0.52 0.66  GLUCOSE 106* 102* 98    Recent Labs Lab 04/30/17 0920 05/05/17 0551  HGB 9.2* 8.7*  HCT 29.7* 28.4*  WBC 9.0 8.5  PLT 333 244   Dg Chest Port 1 View  Result Date: 05/05/2017 CLINICAL DATA:  Shortness of Breath EXAM: PORTABLE CHEST 1 VIEW COMPARISON:  05/02/2017 FINDINGS: Cardiomegaly with vascular congestion. Layering bilateral effusions and bilateral lower lobe airspace opacities, likely atelectasis. Mild interstitial edema suspected. IMPRESSION: Mild CHF. Moderate layering bilateral effusions with bibasilar atelectasis. Electronically Signed   By: Rolm Baptise M.D.   On: 05/05/2017 10:15    ASSESSMENT / PLAN: Acute on chronic hypoxic respiratory failure secondary to CHF exacerbation and bilateral  pleural effusions P: Supplemental O2 to maintain O2 sats >92%  and/or for dyspnea  Continue IV lasix  Prn bronchodilator therapy  Repeat CXR in am  Recommend bilateral thoracentesis-I would proceed with a RIGHT thoracentesis first than proceed with a LEFT thoracentesis the following day if pt tolerates the procedure   Marda Stalker, [place] Pager [phone] (please enter 7 digits) PCCM Consult Pager [phone] (please enter 7 digits)  STAFF NOTE: I. Dr. Ashby Dawes, have personally reviewed the patient's available data including medical history , events of notes, physican examination and test results as part of my evaluation. I have discussed with the  Care with the NP and other care providers including  pharmacist, ICU RN, RRT, dietary.  Physical Exam Lungs - Decreased air entry bilaterally.   On my personal review of images, CT chest 8/27/18and chest x-ray 05/05/17 there are moderate to large bilateral pleural effusions, with significant cardiomegaly,and rightward mediastinal shift.   The patient appears to have moderate to large bilateral pleural effusions and pulmonary edema with cardiomegaly, with volume overload.we'll proceed with right-sidedthoracentesis, and can consider left-sided thoracentesis the following day, if the patient continues to be dyspneic.would agree with palliative care consultation and possible hospice referral given the patient's advanced age, comorbidities. These effusions are likely to recur within a matter of days to weeks.  Marda Stalker, MD.   Board Certified in Internal Medicine, Pulmonary Medicine, [place], and Sleep Medicine.  [facility] Office Number: [phone] Pager: [phone]  Patricia Pesa, M.D.  Merton Border, M.D

## 2017-05-05 NOTE — Progress Notes (Signed)
[facility] [place] NAME: Victoria Contreras    MR#:  [id number]  DATE OF BIRTH:  1925-04-13  SUBJECTIVE:   Patient here due to respiratory distress and failure secondary to congestive heart failure.  Remains on HiflO [place] and difficult to wean as with ambulation pt's O2 sats drop in the 70's.  Pt. Is clinically asymptomatic.   REVIEW OF SYSTEMS:    Review of Systems  Constitutional: Negative for chills and fever.  HENT: Negative for congestion and tinnitus.   Eyes: Negative for blurred vision and double vision.  Respiratory: Negative for cough, shortness of breath and wheezing.   Cardiovascular: Negative for chest pain, orthopnea and PND.  Gastrointestinal: Negative for abdominal pain, diarrhea, nausea and vomiting.  Genitourinary: Negative for dysuria and hematuria.  Neurological: Negative for dizziness, sensory change and focal weakness.  All other systems reviewed and are negative.   Nutrition: Heart Healthy Tolerating Diet: Yes Tolerating PT: Eval noted.    DRUG ALLERGIES:   Allergies  Allergen Reactions  . Codeine     Other reaction(s): Unknown  . Sulfa Antibiotics Other (See Comments)    Deathly sick   . Tape Itching    Other reaction(s): Itching of Skin    VITALS:  Blood pressure 106/71, pulse (!) 108, temperature 97.8 F (36.6 C), temperature source Oral, resp. rate 18, height 5\' 9"  (1.753 m), weight 89.2 kg (196 lb 11.2 oz), SpO2 90 %.  PHYSICAL EXAMINATION:   Physical Exam  GENERAL:  82 y.o.-year-old patient lying in bed on Hiflo [place] but in NAD.  EYES: Pupils equal, round, reactive to light and accommodation. No scleral icterus. Extraocular muscles intact.  HEENT: Head atraumatic, normocephalic. Oropharynx and nasopharynx clear.  NECK:  Supple, no jugular venous distention. No thyroid enlargement, no tenderness.  LUNGS: Normal breath sounds bilaterally, no wheezing, minimal bibasilar rales, No rhonchi. No use of  accessory muscles of respiration.  CARDIOVASCULAR: S1, S2 normal. No murmurs, rubs, or gallops.  ABDOMEN: Soft, nontender, nondistended. Bowel sounds present. No organomegaly or mass.  EXTREMITIES: No cyanosis, clubbing, +1-2 edema b/l  NEUROLOGIC: Cranial nerves II through XII are intact. No focal Motor or sensory deficits b/l.  Globally weak.  PSYCHIATRIC: The patient is alert and oriented x 3.  SKIN: No obvious rash, lesion, or ulcer.    LABORATORY PANEL:   CBC  Recent Labs Lab 05/05/17 0551  WBC 8.5  HGB 8.7*  HCT 28.4*  PLT 244   ------------------------------------------------------------------------------------------------------------------  Chemistries   Recent Labs Lab 04/30/17 0920  05/05/17 0551  NA 139  < > 138  K 3.2*  < > 3.4*  CL 103  < > 97*  CO2 28  < > 33*  GLUCOSE 110*  < > 98  BUN 15  < > 10  CREATININE 0.81  < > 0.66  CALCIUM 8.6*  < > 8.4*  MG  --   < > 1.6*  AST 27  --   --   ALT 19  --   --   ALKPHOS 121  --   --   BILITOT 0.7  --   --   < > = values in this interval not displayed. ------------------------------------------------------------------------------------------------------------------  Cardiac Enzymes  Recent Labs Lab 04/30/17 0920  TROPONINI <0.03   ------------------------------------------------------------------------------------------------------------------  RADIOLOGY:  Dg Chest Port 1 View  Result Date: 05/05/2017 CLINICAL DATA:  Shortness of Breath EXAM: PORTABLE CHEST 1 VIEW COMPARISON:  05/02/2017 FINDINGS: Cardiomegaly with vascular congestion. Layering  bilateral effusions and bilateral lower lobe airspace opacities, likely atelectasis. Mild interstitial edema suspected. IMPRESSION: Mild CHF. Moderate layering bilateral effusions with bibasilar atelectasis. Electronically Signed   By: Rolm Baptise M.D.   On: 05/05/2017 10:15     ASSESSMENT AND PLAN:   83 year old female with past medical history of CLL,  hypertension, hyperlipidemia, history of previous CVA, chronic diastolic heart failure, atrial fibrillation who presented to the hospital due to shortness of breath and noted to be in congestive heart failure.  1. Acute respiratory failure with hypoxia-secondary to decompensated congestive heart failure. -Initially patient was on BiPAP but weaned off it and remains on Hiflo and difficult to wean as she becomes Hypoxic on minimal exertion.  -CT chest obtained showing no evidence of pulmonary embolism but bilateral pleural effusions and vascular congestion consistent with heart failure. Despite diuresis pt's O2 requirements are high. CXR today showing moderate b/l Pleural effusions. Pulmonary consult obtained and they recommend thoracentesis which has been ordered.   2. CHF-acute on chronic diastolic CHF. - improving w/ diuresis and will cont. To monitor. About 8 L (-) since admission.  - Continue IV Lasix, follow I's and O's and daily weights.    3. History of chronic atrial fibrillation-cont. Cardizem, Amiodarone.  - rate controlled. Not on long term anti-coagulation due to high fall risk. Cont. ASA - Cardiology following.   4.  Hypokalemia - due to diuresis and improved w/ supplementation and will cont. To monitor.  -  Mg. Level normal.    5. Hx of CVA - cont. ASA  6. History of gout- No acute attack. Continue allopurinol.  7. Depression-continue Celexa.  8. GERD-continue Protonix.   All the records are reviewed and case discussed with Care Management/Social Worker. Management plans discussed with the patient, family and they are in agreement.  CODE STATUS: DNR  DVT Prophylaxis: Lovenox  TOTAL TIME TAKING CARE OF THIS PATIENT: 30 minutes.   POSSIBLE D/C IN 2-3 DAYS, DEPENDING ON CLINICAL CONDITION.   Henreitta Leber M.D on 05/05/2017 at 2:26 PM  Between 7am to 6pm - Pager - [phone]  After 6pm go to [email or web address] - Proofreader  [facility]  Office  [phone]  CC: Primary care physician; [name], MD R

## 2017-05-05 NOTE — Care Management (Signed)
Pulmonary is recommending bilateral thoracentesis right side today and left side tomorrow.

## 2017-05-05 NOTE — Progress Notes (Signed)
Patient back from thoracentesis. Per Dr. Verdell Carmine, change frequency of IV lasix to every 12 hours.

## 2017-05-05 NOTE — Care Management (Signed)
Requiring high flow nasal cannula.  Skilled nursing placement is recommended.  Reached out to attending regarding palliative care consult for goals.  CM will investigate whether patient would meet criteria for transitional care hospital ([facility]) due to continue high oxygen requirements. Pulmonary consult is pending

## 2017-05-06 LAB — PATHOLOGIST SMEAR REVIEW

## 2017-05-06 LAB — MISC LABCORP TEST (SEND OUT): Labcorp test code: 19588

## 2017-05-06 LAB — PH, BODY FLUID: pH, Body Fluid: 7.9

## 2017-05-06 MED ORDER — POTASSIUM CHLORIDE 20 MEQ PO PACK
20.0000 meq | PACK | Freq: Every day | ORAL | Status: DC
  Administered 2017-05-06 – 2017-05-10 (×5): 20 meq via ORAL
  Filled 2017-05-06 (×5): qty 1

## 2017-05-06 NOTE — Care Management Important Message (Signed)
Important Message  Patient Details  Name: Victoria Contreras MRN: [id number] Date of Birth: 11/16/1924   Medicare Important Message Given:  Yes    Katrina Stack, RN 05/06/2017, 6:02 PM

## 2017-05-06 NOTE — Clinical Social Work Note (Signed)
CSW met with patient and her daughter who was at bedside.  Patient is from [facility], she is in agreement to going to SNF for short term rehab.  CSW was informed that palliative has been ordered to meet with patient and her family.  [facility] can accept patient once she is medically ready for discharge, orders have been received, and if patient needs Bipap at night, [facility] will order one.  [facility] is requesting Bipap settings, CSW has faxed Bipap settings and order.  CSW continue to following patient's progress throughout discharge planning.  Jones Broom. [place], MSW, [place]  05/06/2017 4:50 PM

## 2017-05-06 NOTE — Care Management (Signed)
Patient had approximately one liter of fluid removed through thoracentesis of right chest and says she is not going to have the procedure performed on the left side.  she is now back on high flow nasal cannula.  Attending has placed order for palliative consult for goals of care.

## 2017-05-06 NOTE — Progress Notes (Signed)
Physical Therapy Treatment Patient Details Name: Victoria Contreras MRN: [id number] DOB: 01/25/1925 Today's Date: 05/06/2017    History of Present Illness Pt is a 83 y/o F who presented with severe SOB.  Pt was placed on BiPAP and admitted for respiratory distress and failure secondary to CHF.  Pt's PMH includes a-fib, CHF, CVA, gout, mood disorder, R TKA.    PT Comments    Ms. Milligan's mobility remains limited by length of HFNC.  She sat EOB for ~10 minutes with cues for upright posture and scapular squeeze.  She currently requires assist to boost to standing and to pivot to [facility].  SNF remains appropriate d/c plan.     Follow Up Recommendations  SNF     Equipment Recommendations  None recommended by PT    Recommendations for Other Services       Precautions / Restrictions Precautions Precautions: Fall;Other (comment) Precaution Comments: Pt currently on HFNC Restrictions Weight Bearing Restrictions: No    Mobility  Bed Mobility Overal bed mobility: Needs Assistance Bed Mobility: Supine to Sit     Supine to sit: Min guard;HOB elevated     General bed mobility comments: Increased time and effort with cues to scoot to EOB.  Pt takes one rest break due to fatigue.  Transfers Overall transfer level: Needs assistance Equipment used: Rolling walker (2 wheeled) Transfers: Sit to/from [facility] Sit to Stand: Mod assist Stand pivot transfers: Min assist       General transfer comment: Assist to boost to standing after cues provided to scoot to EOB for ease of standing.  Pt requires min assist for positioning of RW when pivoting to [facility] with cues to reach back for armrests when preparing to sit.  Pt controls descent to chair well.  Ambulation/Gait             General Gait Details: Unable to assess as pt currently on HFNC   Stairs            Wheelchair Mobility    Modified Rankin (Stroke Patients Only)       Balance Overall balance  assessment: Needs assistance Sitting-balance support: No upper extremity supported;Feet supported Sitting balance-Leahy Scale: Good     Standing balance support: Bilateral upper extremity supported;During functional activity Standing balance-Leahy Scale: Poor Standing balance comment: Relies on UE support for static and dynamic activities                            Cognition Arousal/Alertness: Awake/alert Behavior During Therapy: WFL for tasks assessed/performed Overall Cognitive Status: Within Functional Limits for tasks assessed                                        Exercises General Exercises - Lower Extremity Ankle Circles/Pumps: AROM;Both;15 reps;Supine Other Exercises Other Exercises: Encouraged pt to practice upright posture in sitting and standing throughout the day for improved posture and pulmonary function Other Exercises: Pt demonstrated pursed lip breathing and was encouraged to continue practicing throughout the day for improved pulmonary function Other Exercises: Encouraged pt to sit EOB at least 3x/day with nursing staff Other Exercises: Pt sat EOB for ~10 minutes with cues for upright posture and scapular squeeze    General Comments General comments (skin integrity, edema, etc.): When pt resting sitting EOB suddenly water begins feeding through the nasal portion of her HFNC.  Pt luckily was not breathing in at the time and it was only for a quick moment; however, this PT called the RN who then called the RT who assessed and made corrections to HFNC.  Pt not in distress and SpO2 remains at or above 95% on HFNC thorughout entire session.  Pt sitting on BSC at end of session with call bell within reach.  Pt denies dizziness or lightheadedness.      Pertinent Vitals/Pain Pain Assessment: No/denies pain    Home Living                      Prior Function            PT Goals (current goals can now be found in the care plan  section) Acute Rehab PT Goals Patient Stated Goal: to return to PLOF PT Goal Formulation: With patient Time For Goal Achievement: 05/18/17 Potential to Achieve Goals: Good Progress towards PT goals: Not progressing toward goals - comment (due to limiting length of HFNC)    Frequency    Min 2X/week      PT Plan Current plan remains appropriate    Co-evaluation              AM-PAC PT "6 Clicks" Daily Activity  Outcome Measure  Difficulty turning over in bed (including adjusting bedclothes, sheets and blankets)?: A Lot Difficulty moving from lying on back to sitting on the side of the bed? : A Lot Difficulty sitting down on and standing up from a chair with arms (e.g., wheelchair, bedside commode, etc,.)?: Unable Help needed moving to and from a bed to chair (including a wheelchair)?: A Little Help needed walking in hospital room?: A Lot Help needed climbing 3-5 steps with a railing? : A Lot 6 Click Score: 12    End of Session Equipment Utilized During Treatment: Oxygen (on HFNC) Activity Tolerance: Other (comment);Patient tolerated treatment well (limited by length of HFNC ) Patient left: with call bell/phone within reach;Other (comment) (on BSC, pt agrees to push call bell once done) Nurse Communication: Mobility status;Other (comment) (issue with HFNC, pt on BSC at end of session) PT Visit Diagnosis: Unsteadiness on feet (R26.81);Muscle weakness (generalized) (M62.81);Difficulty in walking, not elsewhere classified (R26.2)     Time: 1127-1203 PT Time Calculation (min) (ACUTE ONLY): 36 min  Charges:  $Therapeutic Activity: 23-37 mins                    G Codes:       Collie Siad PT, DPT 05/06/2017, 12:22 PM

## 2017-05-06 NOTE — Progress Notes (Signed)
Patient ID: Victoria Contreras, female   DOB: 26-Feb-1925, 83 y.o.   MRN: [id number]  [facility] PROGRESS NOTE  Victoria Contreras TKW:[id number] DOB: [birth year] DOA: 04/30/2017 PCP: [name], MD  HPI/Subjective: Patient feels a little bit better after yesterday's thoracentesis. Still with some shortness of breath and some cough. She stated she had severe pain with the procedure will never do that procedure again.  Objective: Vitals:   05/06/17 0915 05/06/17 1415  BP: (!) 110/51 (!) 96/52  Pulse: (!) 105 (!) 108  Resp: 18 18  Temp: 98.4 F (36.9 C) 98 F (36.7 C)  SpO2: 99% 98%    Filed Weights   05/04/17 0336 05/05/17 0359 05/06/17 0328  Weight: 90.9 kg (200 lb 6.4 oz) 89.2 kg (196 lb 11.2 oz) 88.4 kg (194 lb 12.8 oz)    ROS: Review of Systems  Constitutional: Negative for chills and fever.  Eyes: Negative for blurred vision.  Respiratory: Positive for cough and shortness of breath.   Cardiovascular: Negative for chest pain.  Gastrointestinal: Negative for abdominal pain, constipation, diarrhea, nausea and vomiting.  Genitourinary: Negative for dysuria.  Musculoskeletal: Negative for joint pain.  Neurological: Negative for dizziness and headaches.   Exam: Physical Exam  Constitutional: She is oriented to person, place, and time.  HENT:  Nose: No mucosal edema.  Mouth/Throat: No oropharyngeal exudate or posterior oropharyngeal edema.  Eyes: Pupils are equal, round, and reactive to light. Conjunctivae, EOM and lids are normal.  Neck: No JVD present. Carotid bruit is not present. No edema present. No thyroid mass and no thyromegaly present.  Cardiovascular: S1 normal and S2 normal.  Exam reveals no gallop.   No murmur heard. Pulses:      Dorsalis pedis pulses are 2+ on the right side, and 2+ on the left side.  Respiratory: No respiratory distress. She has decreased breath sounds in the right middle field and the left lower field. She has no wheezes. She has rhonchi  in the right lower field and the left lower field. She has no rales.  GI: Soft. Bowel sounds are normal. There is no tenderness.  Musculoskeletal:       Right ankle: She exhibits swelling.       Left ankle: She exhibits swelling.  Lymphadenopathy:    She has no cervical adenopathy.  Neurological: She is alert and oriented to person, place, and time. No cranial nerve deficit.  Skin: Skin is warm. No rash noted. Nails show no clubbing.  Psychiatric: She has a normal mood and affect.      Data Reviewed: Basic Metabolic Panel:  Recent Labs Lab 05/01/17 0502 05/02/17 0412 05/02/17 1754 05/03/17 0436 05/04/17 0359 05/05/17 0551  NA 141 140  --  140 139 138  K 3.1* 2.9*  --  3.1* 3.5 3.4*  CL 104 101  --  101 99* 97*  CO2 28 30  --  33* 32 33*  GLUCOSE 91 104*  --  106* 102* 98  BUN 12 12  --  10 11 10   CREATININE 0.77 0.69  --  0.69 0.52 0.66  CALCIUM 8.3* 8.4*  --  8.3* 8.2* 8.4*  MG  --   --  1.7  --   --  1.6*   Liver Function Tests:  Recent Labs Lab 04/30/17 0920  AST 27  ALT 19  ALKPHOS 121  BILITOT 0.7  PROT 6.3*  ALBUMIN 3.3*    Recent Labs Lab 04/30/17 0920  LIPASE 20  CBC:  Recent Labs Lab 04/30/17 0920 05/05/17 0551  WBC 9.0 8.5  NEUTROABS 6.2  --   HGB 9.2* 8.7*  HCT 29.7* 28.4*  MCV 64.4* 65.3*  PLT 333 244   Cardiac Enzymes:  Recent Labs Lab 04/30/17 0920  TROPONINI <0.03   BNP (last 3 results)  Recent Labs  10/30/16 1548 10/31/16 1227 04/30/17 0921  BNP 346.0* 288.0* 537.0*     CBG:  Recent Labs Lab 04/30/17 1216  GLUCAP 113*    Recent Results (from the past 240 hour(s))  Urine culture     Status: Abnormal   Collection Time: 04/30/17  9:20 AM  Result Value Ref Range Status   Specimen Description URINE, CATHETERIZED  Final   Special Requests NONE  Final   Culture MULTIPLE SPECIES PRESENT, SUGGEST RECOLLECTION (A)  Final   Report Status 05/01/2017 FINAL  Final  Culture, blood (Routine x 2)     Status: None    Collection Time: 04/30/17  9:21 AM  Result Value Ref Range Status   Specimen Description BLOOD BLOOD LEFT FOREARM  Final   Special Requests   Final    BOTTLES DRAWN AEROBIC AND ANAEROBIC Blood Culture adequate volume   Culture NO GROWTH 5 DAYS  Final   Report Status 05/05/2017 FINAL  Final  Culture, blood (Routine x 2)     Status: None   Collection Time: 04/30/17  9:21 AM  Result Value Ref Range Status   Specimen Description BLOOD LEFT ANTECUBITAL  Final   Special Requests   Final    BOTTLES DRAWN AEROBIC AND ANAEROBIC Blood Culture adequate volume   Culture NO GROWTH 5 DAYS  Final   Report Status 05/05/2017 FINAL  Final  MRSA PCR Screening     Status: None   Collection Time: 04/30/17 12:34 PM  Result Value Ref Range Status   MRSA by PCR NEGATIVE NEGATIVE Final    Comment:        The GeneXpert MRSA Assay (FDA approved for NASAL specimens only), is one component of a comprehensive MRSA colonization surveillance program. It is not intended to diagnose MRSA infection nor to guide or monitor treatment for MRSA infections.   Body fluid culture     Status: None (Preliminary result)   Collection Time: 05/05/17  2:50 PM  Result Value Ref Range Status   Specimen Description PLEURAL  Final   Special Requests NONE  Final   Gram Stain   Final    FEW WBC PRESENT, PREDOMINANTLY MONONUCLEAR NO ORGANISMS SEEN    Culture   Final    NO GROWTH < 24 HOURS Performed at [facility], [address], [place], [place] [address]    Report Status PENDING  Incomplete     Studies: Dg Chest Port 1 View  Result Date: 05/05/2017 CLINICAL DATA:  Thoracentesis EXAM: PORTABLE CHEST 1 VIEW COMPARISON:  Earlier today FINDINGS: Left-sided thoracentesis with no visible residual fluid. Negative for pneumothorax. No acute airspace opacity. Interstitial coarsening is stable. Stable cardiomegaly. IMPRESSION: No acute finding after left thoracentesis. No visible residual left pleural fluid.  Electronically Signed   By: Monte Fantasia M.D.   On: 05/05/2017 15:41   Dg Chest Port 1 View  Result Date: 05/05/2017 CLINICAL DATA:  Shortness of Breath EXAM: PORTABLE CHEST 1 VIEW COMPARISON:  05/02/2017 FINDINGS: Cardiomegaly with vascular congestion. Layering bilateral effusions and bilateral lower lobe airspace opacities, likely atelectasis. Mild interstitial edema suspected. IMPRESSION: Mild CHF. Moderate layering bilateral effusions with bibasilar atelectasis. Electronically  Signed   By: Rolm Baptise M.D.   On: 05/05/2017 10:15   US Thoracentesis Asp Pleural Space W/img Guide  Result Date: 05/05/2017 INDICATION: 83 year old female with bilateral right larger than left pleural effusions. EXAM: ULTRASOUND GUIDED LEFT THORACENTESIS MEDICATIONS: None. COMPLICATIONS: None immediate. PROCEDURE: An ultrasound guided thoracentesis was thoroughly discussed with the patient and questions answered. The benefits, risks, alternatives and complications were also discussed. The patient understands and wishes to proceed with the procedure. Written consent was obtained. Ultrasound was performed to localize and mark an adequate pocket of fluid in the left chest. The area was then prepped and draped in the normal sterile fashion. 1% Lidocaine was used for local anesthesia. Under ultrasound guidance a 6 Fr Safe-T-Centesis catheter was introduced. Thoracentesis was performed. The catheter was removed and a dressing applied. FINDINGS: A total of approximately 750 mL of yellow pleural fluid was removed. Samples were sent to the laboratory as requested by the clinical team. IMPRESSION: Successful ultrasound guided left thoracentesis yielding 750 mL of pleural fluid. Electronically Signed   By: Jacqulynn Cadet M.D.   On: 05/05/2017 16:48    Scheduled Meds: . allopurinol  300 mg Oral Daily  . amiodarone  200 mg Oral Daily  . aspirin EC  81 mg Oral Daily  . chlorhexidine  15 mL Mouth Rinse BID  . citalopram  20 mg  Oral Daily  . diltiazem  300 mg Oral Daily  . docusate sodium  100 mg Oral BID  . enoxaparin (LOVENOX) injection  40 mg Subcutaneous Q24H  . estradiol  1 Applicatorful Vaginal Q M,W,F  . furosemide  20 mg Intravenous Q12H  . hydrocortisone cream  1 application Topical QID  . mouth rinse  15 mL Mouth Rinse q12n4p  . pantoprazole  40 mg Oral QAC breakfast  . polyethylene glycol  17 g Oral Daily  . potassium chloride  20 mEq Oral Daily    Assessment/Plan:  1. Acute hypoxic respiratory failure. Patient on high flow nasal cannula 60% when I saw her. The patient will be here in the hospital until off high flow nasal cannula. Palliative care consultation. 2. Acute on chronic diastolic congestive heart failure with bilateral large pleural effusions. Continue IV Lasix increase the dose to 40 twice a day 3. Bilateral pleural effusions. Status post thoracentesis on the left. Patient states that she will never undergo that procedure again. I advised that this may make her breathe better quicker and come off the high flow nasal cannula so we can get her out of the hospital. She is still against this procedure. I spoke with the son about this on the phone. 4. Chronic atrial fibrillation on Cardizem and amiodarone. Aspirin only for anticoagulation 5. Hypokalemia replace potassium orally 6. History of CVA on aspirin 7. History of gout on allopurinol 8. Depression on Celexa 9. GERD on Protonix 10. Weakness. Physical therapy recommends rehabilitation.  Code Status:     Code Status Orders        Start     Ordered   04/30/17 1226  Do not attempt resuscitation (DNR)  Continuous    Question Answer Comment  In the event of cardiac or respiratory ARREST Do not call a "code blue"   In the event of cardiac or respiratory ARREST Do not perform Intubation, CPR, defibrillation or ACLS   In the event of cardiac or respiratory ARREST Use medication by any route, position, wound care, and other measures to  relive pain and suffering. May use oxygen,  suction and manual treatment of airway obstruction as needed for comfort.   Comments RN may pronounce      04/30/17 1225    Code Status History    Date Active Date Inactive Code Status Order ID Comments User Context   03/08/2017  6:16 PM 03/09/2017  7:07 PM Full Code [id number]  [name], MD ED   09/01/2016  3:45 AM 09/07/2016  8:45 PM Full Code [id number]  Hugelmeyer, Ubaldo Glassing, DO Inpatient    Advance Directive Documentation     Most Recent Value  Type of Advance Directive  Healthcare Power of Attorney  Pre-existing out of facility DNR order (yellow form or pink MOST form)  -  "MOST" Form in Place?  -     Family Communication: Spoke with son on the phone Disposition Plan: Unclear at this point  Time spent: 28 minutes  Loletha Grayer  [employer]

## 2017-05-06 NOTE — Progress Notes (Signed)
Palliative Medicine Team  Due to high volume of referrals, there is a delay seeing this patient. PMT not at [facility] over the weekend but will arrange goals of care with patient and family on Monday. If discharged over the weekend, may benefit from outpatient palliative referral for initial goals of care. Thank you for the opportunity to participate in the care of Ms. Lehr.   Ihor Dow, FNP-C Palliative Medicine Team  Phone: [phone] Fax: [phone]

## 2017-05-07 LAB — BASIC METABOLIC PANEL
Anion gap: 8 (ref 5–15)
BUN: 11 mg/dL (ref 6–20)
CHLORIDE: 97 mmol/L — AB (ref 101–111)
CO2: 33 mmol/L — AB (ref 22–32)
CREATININE: 0.62 mg/dL (ref 0.44–1.00)
Calcium: 8.5 mg/dL — ABNORMAL LOW (ref 8.9–10.3)
GFR calc non Af Amer: >60 mL/min (ref >60)
Glucose, Bld: 98 mg/dL (ref 65–99)
Potassium: 3.5 mmol/L (ref 3.5–5.1)
Sodium: 138 mmol/L (ref 135–145)

## 2017-05-07 MED ORDER — POTASSIUM CHLORIDE CRYS ER 20 MEQ PO TBCR
40.0000 meq | EXTENDED_RELEASE_TABLET | Freq: Once | ORAL | Status: AC
  Administered 2017-05-07: 40 meq via ORAL
  Filled 2017-05-07: qty 2

## 2017-05-07 MED ORDER — BISOPROLOL FUMARATE 5 MG PO TABS
5.0000 mg | ORAL_TABLET | Freq: Every day | ORAL | Status: DC
  Administered 2017-05-07 – 2017-05-10 (×4): 5 mg via ORAL
  Filled 2017-05-07 (×4): qty 1

## 2017-05-07 MED ORDER — FUROSEMIDE 10 MG/ML IJ SOLN
40.0000 mg | Freq: Two times a day (BID) | INTRAMUSCULAR | Status: DC
  Administered 2017-05-07 – 2017-05-10 (×6): 40 mg via INTRAVENOUS
  Filled 2017-05-07 (×7): qty 4

## 2017-05-07 NOTE — Progress Notes (Signed)
Decreased fio2 to 40%, will continue to monitor.

## 2017-05-07 NOTE — Progress Notes (Signed)
Patient ID: Victoria Contreras, female   DOB: 1925-02-16, 83 y.o.   MRN: [id number]   [facility] PROGRESS NOTE  BRIANCA FORTENBERRY YOV:[id number] DOB: 08-May-1925 DOA: 04/30/2017 PCP: [name], MD  HPI/Subjective: Patient states she feels okay.  States her breathing is okay. She remains on high flow nasal cannula 64% this morning.  Objective: Vitals:   05/07/17 0508 05/07/17 0800  BP: (!) 113/59 118/62  Pulse: (!) 102 (!) 111  Resp: 18 18  Temp: 98.6 F (37 C) 97.6 F (36.4 C)  SpO2: 92% 96%    Filed Weights   05/05/17 0359 05/06/17 0328 05/07/17 0508  Weight: 89.2 kg (196 lb 11.2 oz) 88.4 kg (194 lb 12.8 oz) 86 kg (189 lb 8 oz)    ROS: Review of Systems  Constitutional: Negative for chills and fever.  Eyes: Negative for blurred vision.  Respiratory: Positive for cough and shortness of breath.   Cardiovascular: Negative for chest pain.  Gastrointestinal: Negative for abdominal pain, constipation, diarrhea, nausea and vomiting.  Genitourinary: Negative for dysuria.  Musculoskeletal: Negative for joint pain.  Neurological: Negative for dizziness and headaches.   Exam: Physical Exam  Constitutional: She is oriented to person, place, and time.  HENT:  Nose: No mucosal edema.  Mouth/Throat: No oropharyngeal exudate or posterior oropharyngeal edema.  Eyes: Pupils are equal, round, and reactive to light. Conjunctivae, EOM and lids are normal.  Neck: No JVD present. Carotid bruit is not present. No edema present. No thyroid mass and no thyromegaly present.  Cardiovascular: S1 normal, S2 normal and normal heart sounds.  Tachycardia present.  Exam reveals no gallop.   No murmur heard. Pulses:      Dorsalis pedis pulses are 2+ on the right side, and 2+ on the left side.  Respiratory: No respiratory distress. She has decreased breath sounds in the right middle field, the right lower field and the left lower field. She has no wheezes. She has rhonchi in the right middle field,  the right lower field and the left lower field. She has no rales.  GI: Soft. Bowel sounds are normal. There is no tenderness.  Musculoskeletal:       Right ankle: She exhibits swelling.       Left ankle: She exhibits swelling.  Lymphadenopathy:    She has no cervical adenopathy.  Neurological: She is alert and oriented to person, place, and time. No cranial nerve deficit.  Skin: Skin is warm. No rash noted. Nails show no clubbing.  Psychiatric: She has a normal mood and affect.      Data Reviewed: Basic Metabolic Panel:  Recent Labs Lab 05/02/17 0412 05/02/17 1754 05/03/17 0436 05/04/17 0359 05/05/17 0551 05/07/17 0813  NA 140  --  140 139 138 138  K 2.9*  --  3.1* 3.5 3.4* 3.5  CL 101  --  101 99* 97* 97*  CO2 30  --  33* 32 33* 33*  GLUCOSE 104*  --  106* 102* 98 98  BUN 12  --  10 11 10 11   CREATININE 0.69  --  0.69 0.52 0.66 0.62  CALCIUM 8.4*  --  8.3* 8.2* 8.4* 8.5*  MG  --  1.7  --   --  1.6*  --    Liver Function Tests: No results for input(s): AST, ALT, ALKPHOS, BILITOT, PROT, ALBUMIN in the last 168 hours. No results for input(s): LIPASE, AMYLASE in the last 168 hours. CBC:  Recent Labs Lab 05/05/17 0551  WBC  8.5  HGB 8.7*  HCT 28.4*  MCV 65.3*  PLT 244   BNP (last 3 results)  Recent Labs  10/30/16 1548 10/31/16 1227 04/30/17 0921  BNP 346.0* 288.0* 537.0*      Recent Results (from the past 240 hour(s))  Urine culture     Status: Abnormal   Collection Time: 04/30/17  9:20 AM  Result Value Ref Range Status   Specimen Description URINE, CATHETERIZED  Final   Special Requests NONE  Final   Culture MULTIPLE SPECIES PRESENT, SUGGEST RECOLLECTION (A)  Final   Report Status 05/01/2017 FINAL  Final  Culture, blood (Routine x 2)     Status: None   Collection Time: 04/30/17  9:21 AM  Result Value Ref Range Status   Specimen Description BLOOD BLOOD LEFT FOREARM  Final   Special Requests   Final    BOTTLES DRAWN AEROBIC AND ANAEROBIC Blood  Culture adequate volume   Culture NO GROWTH 5 DAYS  Final   Report Status 05/05/2017 FINAL  Final  Culture, blood (Routine x 2)     Status: None   Collection Time: 04/30/17  9:21 AM  Result Value Ref Range Status   Specimen Description BLOOD LEFT ANTECUBITAL  Final   Special Requests   Final    BOTTLES DRAWN AEROBIC AND ANAEROBIC Blood Culture adequate volume   Culture NO GROWTH 5 DAYS  Final   Report Status 05/05/2017 FINAL  Final  MRSA PCR Screening     Status: None   Collection Time: 04/30/17 12:34 PM  Result Value Ref Range Status   MRSA by PCR NEGATIVE NEGATIVE Final    Comment:        The GeneXpert MRSA Assay (FDA approved for NASAL specimens only), is one component of a comprehensive MRSA colonization surveillance program. It is not intended to diagnose MRSA infection nor to guide or monitor treatment for MRSA infections.   Body fluid culture     Status: None (Preliminary result)   Collection Time: 05/05/17  2:50 PM  Result Value Ref Range Status   Specimen Description PLEURAL  Final   Special Requests NONE  Final   Gram Stain   Final    FEW WBC PRESENT, PREDOMINANTLY MONONUCLEAR NO ORGANISMS SEEN    Culture   Final    NO GROWTH 2 DAYS Performed at [facility], [address], [place], [place] [address]    Report Status PENDING  Incomplete     Studies: Dg Chest Port 1 View  Result Date: 05/05/2017 CLINICAL DATA:  Thoracentesis EXAM: PORTABLE CHEST 1 VIEW COMPARISON:  Earlier today FINDINGS: Left-sided thoracentesis with no visible residual fluid. Negative for pneumothorax. No acute airspace opacity. Interstitial coarsening is stable. Stable cardiomegaly. IMPRESSION: No acute finding after left thoracentesis. No visible residual left pleural fluid. Electronically Signed   By: Monte Fantasia M.D.   On: 05/05/2017 15:41   US Thoracentesis Asp Pleural Space W/img Guide  Result Date: 05/05/2017 INDICATION: 83 year old female with bilateral right larger than  left pleural effusions. EXAM: ULTRASOUND GUIDED LEFT THORACENTESIS MEDICATIONS: None. COMPLICATIONS: None immediate. PROCEDURE: An ultrasound guided thoracentesis was thoroughly discussed with the patient and questions answered. The benefits, risks, alternatives and complications were also discussed. The patient understands and wishes to proceed with the procedure. Written consent was obtained. Ultrasound was performed to localize and mark an adequate pocket of fluid in the left chest. The area was then prepped and draped in the normal sterile fashion. 1% Lidocaine was used for  local anesthesia. Under ultrasound guidance a 6 Fr Safe-T-Centesis catheter was introduced. Thoracentesis was performed. The catheter was removed and a dressing applied. FINDINGS: A total of approximately 750 mL of yellow pleural fluid was removed. Samples were sent to the laboratory as requested by the clinical team. IMPRESSION: Successful ultrasound guided left thoracentesis yielding 750 mL of pleural fluid. Electronically Signed   By: Jacqulynn Cadet M.D.   On: 05/05/2017 16:48    Scheduled Meds: . allopurinol  300 mg Oral Daily  . amiodarone  200 mg Oral Daily  . aspirin EC  81 mg Oral Daily  . chlorhexidine  15 mL Mouth Rinse BID  . citalopram  20 mg Oral Daily  . diltiazem  300 mg Oral Daily  . docusate sodium  100 mg Oral BID  . enoxaparin (LOVENOX) injection  40 mg Subcutaneous Q24H  . estradiol  1 Applicatorful Vaginal Q M,W,F  . furosemide  40 mg Intravenous BID  . hydrocortisone cream  1 application Topical QID  . mouth rinse  15 mL Mouth Rinse q12n4p  . pantoprazole  40 mg Oral QAC breakfast  . polyethylene glycol  17 g Oral Daily  . potassium chloride  20 mEq Oral Daily  . potassium chloride  40 mEq Oral Once    Assessment/Plan:  1. Acute hypoxic respiratory failure. Patient on high flow nasal cannula 64% when I saw her. The patient will be here in the hospital until off high flow nasal cannula.  Palliative care consultation ordered 2. Acute on chronic diastolic congestive heart failure with bilateral large pleural effusions. Patient status post thoracentesis on the left. Increase Lasix to 40 mg IV twice a day. 3. Bilateral pleural effusions. Status post thoracentesis on the left. Patient absolutely refuses thoracentesis on the other side. Increase Lasix 40 mg IV twice a day. 4. Chronic atrial fibrillation on Cardizem and amiodarone. Aspirin only for anticoagulation. Added low-dose bisoprolol for heart rate control 5. Hypokalemia replace potassium again today 6. History of CVA on aspirin 7. History of gout on allopurinol 8. Depression on Celexa 9. GERD on Protonix 10. Weakness. Physical therapy recommends rehabilitation.  Code Status:     Code Status Orders        Start     Ordered   04/30/17 1226  Do not attempt resuscitation (DNR)  Continuous    Question Answer Comment  In the event of cardiac or respiratory ARREST Do not call a "code blue"   In the event of cardiac or respiratory ARREST Do not perform Intubation, CPR, defibrillation or ACLS   In the event of cardiac or respiratory ARREST Use medication by any route, position, wound care, and other measures to relive pain and suffering. May use oxygen, suction and manual treatment of airway obstruction as needed for comfort.   Comments RN may pronounce      04/30/17 1225    Code Status History    Date Active Date Inactive Code Status Order ID Comments User Context   03/08/2017  6:16 PM 03/09/2017  7:07 PM Full Code [id number]  [name], MD ED   09/01/2016  3:45 AM 09/07/2016  8:45 PM Full Code [id number]  Hugelmeyer, Ubaldo Glassing, DO Inpatient    Advance Directive Documentation     Most Recent Value  Type of Advance Directive  Healthcare Power of Attorney  Pre-existing out of facility DNR order (yellow form or pink MOST form)  -  "MOST" Form in Place?  -     Family Communication: Spoke  with son on the phone Disposition Plan:  Unclear at this point  Time spent: 26 minutes  Loletha Grayer  [employer]

## 2017-05-07 NOTE — Progress Notes (Signed)
Took patient off of HFNC and placed on 5l [place], RN [place] notified, will continue to monitor

## 2017-05-08 ENCOUNTER — Inpatient Hospital Stay: Payer: Medicare Other

## 2017-05-08 LAB — CBC
HCT: 29.1 % — ABNORMAL LOW (ref 35.0–47.0)
HEMOGLOBIN: 8.9 g/dL — AB (ref 12.0–16.0)
MCH: 20.2 pg — AB (ref 26.0–34.0)
MCHC: 30.6 g/dL — AB (ref 32.0–36.0)
MCV: 65.9 fL — AB (ref 80.0–100.0)
PLATELETS: 250 10*3/uL (ref 150–440)
RBC: 4.41 MIL/uL (ref 3.80–5.20)
RDW: 26.1 % — ABNORMAL HIGH (ref 11.5–14.5)
WBC: 9.6 10*3/uL (ref 3.6–11.0)

## 2017-05-08 LAB — CREATININE, SERUM
Creatinine, Ser: 0.8 mg/dL (ref 0.44–1.00)
GFR calc Af Amer: >60 mL/min (ref >60)
GFR calc non Af Amer: >60 mL/min (ref >60)

## 2017-05-08 LAB — POTASSIUM: POTASSIUM: 4.2 mmol/L (ref 3.5–5.1)

## 2017-05-08 NOTE — Progress Notes (Signed)
Patient ID: Victoria Contreras, female   DOB: 03-18-1925, 83 y.o.   MRN: [id number]   [facility] PROGRESS NOTE  Victoria Contreras:[id number] DOB: Sep 26, 1924 DOA: 04/30/2017 PCP: Victoria Carbon, MD  HPI/Subjective: Patient states she feels okay.  States her breathing is okay.  Patient converted off high flow nasal cannula to regular nasal cannula. She states her breathing is okay.  Objective: Vitals:   05/08/17 0831 05/08/17 1212  BP: 122/82 120/74  Pulse: 100 93  Resp: 18 20  Temp: 97.7 F (36.5 C) 98.3 F (36.8 C)  SpO2: 91% 96%    Filed Weights   05/06/17 0328 05/07/17 0508 05/08/17 0507  Weight: 88.4 kg (194 lb 12.8 oz) 86 kg (189 lb 8 oz) 84.5 kg (186 lb 4.8 oz)    ROS: Review of Systems  Constitutional: Negative for chills and fever.  Eyes: Negative for blurred vision.  Respiratory: Positive for cough and shortness of breath.   Cardiovascular: Negative for chest pain.  Gastrointestinal: Negative for abdominal pain, constipation, diarrhea, nausea and vomiting.  Genitourinary: Negative for dysuria.  Musculoskeletal: Negative for joint pain.  Neurological: Negative for dizziness and headaches.   Exam: Physical Exam  Constitutional: She is oriented to person, place, and time.  HENT:  Nose: No mucosal edema.  Mouth/Throat: No oropharyngeal exudate or posterior oropharyngeal edema.  Eyes: Pupils are equal, round, and reactive to light. Conjunctivae, EOM and lids are normal.  Neck: No JVD present. Carotid bruit is not present. No edema present. No thyroid mass and no thyromegaly present.  Cardiovascular: S1 normal, S2 normal and normal heart sounds.  An irregularly irregular rhythm present. Exam reveals no gallop.   No murmur heard. Pulses:      Dorsalis pedis pulses are 2+ on the right side, and 2+ on the left side.  Respiratory: No respiratory distress. She has decreased breath sounds in the right lower field and the left lower field. She has no wheezes. She has  rhonchi in the right lower field. She has no rales.  GI: Soft. Bowel sounds are normal. There is no tenderness.  Musculoskeletal:       Right ankle: She exhibits swelling.       Left ankle: She exhibits swelling.  Lymphadenopathy:    She has no cervical adenopathy.  Neurological: She is alert and oriented to person, place, and time. No cranial nerve deficit.  Skin: Skin is warm. No rash noted. Nails show no clubbing.  Psychiatric: She has a normal mood and affect.      Data Reviewed: Basic Metabolic Panel:  Recent Labs Lab 05/02/17 0412 05/02/17 1754 05/03/17 0436 05/04/17 0359 05/05/17 0551 05/07/17 0813 05/08/17 0429  NA 140  --  140 139 138 138  --   K 2.9*  --  3.1* 3.5 3.4* 3.5 4.2  CL 101  --  101 99* 97* 97*  --   CO2 30  --  33* 32 33* 33*  --   GLUCOSE 104*  --  106* 102* 98 98  --   BUN 12  --  10 11 10 11   --   CREATININE 0.69  --  0.69 0.52 0.66 0.62 0.80  CALCIUM 8.4*  --  8.3* 8.2* 8.4* 8.5*  --   MG  --  1.7  --   --  1.6*  --   --    CBC:  Recent Labs Lab 05/05/17 0551 05/08/17 0429  WBC 8.5 9.6  HGB 8.7* 8.9*  HCT  28.4* 29.1*  MCV 65.3* 65.9*  PLT 244 250   BNP (last 3 results)  Recent Labs  10/30/16 1548 10/31/16 1227 04/30/17 0921  BNP 346.0* 288.0* 537.0*      Recent Results (from the past 240 hour(s))  Urine culture     Status: Abnormal   Collection Time: 04/30/17  9:20 AM  Result Value Ref Range Status   Specimen Description URINE, CATHETERIZED  Final   Special Requests NONE  Final   Culture MULTIPLE SPECIES PRESENT, SUGGEST RECOLLECTION (A)  Final   Report Status 05/01/2017 FINAL  Final  Culture, blood (Routine x 2)     Status: None   Collection Time: 04/30/17  9:21 AM  Result Value Ref Range Status   Specimen Description BLOOD BLOOD LEFT FOREARM  Final   Special Requests   Final    BOTTLES DRAWN AEROBIC AND ANAEROBIC Blood Culture adequate volume   Culture NO GROWTH 5 DAYS  Final   Report Status 05/05/2017 FINAL  Final   Culture, blood (Routine x 2)     Status: None   Collection Time: 04/30/17  9:21 AM  Result Value Ref Range Status   Specimen Description BLOOD LEFT ANTECUBITAL  Final   Special Requests   Final    BOTTLES DRAWN AEROBIC AND ANAEROBIC Blood Culture adequate volume   Culture NO GROWTH 5 DAYS  Final   Report Status 05/05/2017 FINAL  Final  MRSA PCR Screening     Status: None   Collection Time: 04/30/17 12:34 PM  Result Value Ref Range Status   MRSA by PCR NEGATIVE NEGATIVE Final    Comment:        The GeneXpert MRSA Assay (FDA approved for NASAL specimens only), is one component of a comprehensive MRSA colonization surveillance program. It is not intended to diagnose MRSA infection nor to guide or monitor treatment for MRSA infections.   Body fluid culture     Status: None (Preliminary result)   Collection Time: 05/05/17  2:50 PM  Result Value Ref Range Status   Specimen Description PLEURAL  Final   Special Requests NONE  Final   Gram Stain   Final    FEW WBC PRESENT, PREDOMINANTLY MONONUCLEAR NO ORGANISMS SEEN    Culture   Final    NO GROWTH 3 DAYS Performed at [facility], [address], [place], [place] [address]    Report Status PENDING  Incomplete     Studies: Dg Chest Port 1 View  Result Date: 05/08/2017 CLINICAL DATA:  Follow-up pleural effusion EXAM: PORTABLE CHEST 1 VIEW COMPARISON:  05/05/2017 chest radiograph. FINDINGS: Stable cardiomediastinal silhouette with cardiomegaly and aortic atherosclerosis. No pneumothorax. Small right pleural effusion appears stable accounting for differences in positioning. Stable trace left pleural effusion. Mild pulmonary edema, stable. Stable patchy bibasilar lung opacities. IMPRESSION: 1. Stable small right and trace left pleural effusions. 2. Stable mild congestive heart failure. 3. Stable bibasilar lung opacities, favor atelectasis. Electronically Signed   By: Victoria Contreras M.D.   On: 05/08/2017 10:18    Scheduled Meds: .  allopurinol  300 mg Oral Daily  . amiodarone  200 mg Oral Daily  . aspirin EC  81 mg Oral Daily  . bisoprolol  5 mg Oral Daily  . chlorhexidine  15 mL Mouth Rinse BID  . citalopram  20 mg Oral Daily  . diltiazem  300 mg Oral Daily  . docusate sodium  100 mg Oral BID  . enoxaparin (LOVENOX) injection  40 mg  Subcutaneous Q24H  . estradiol  1 Applicatorful Vaginal Q M,W,F  . furosemide  40 mg Intravenous BID  . hydrocortisone cream  1 application Topical QID  . mouth rinse  15 mL Mouth Rinse q12n4p  . pantoprazole  40 mg Oral QAC breakfast  . polyethylene glycol  17 g Oral Daily  . potassium chloride  20 mEq Oral Daily    Assessment/Plan:  1. Acute hypoxic respiratory failure. Patient converted off high flow nasal cannula to regular nasal cannula 5 L.  I would preferably like the patient on 2-3 L nasal cannula prior to disposition. 2. Acute on chronic diastolic congestive heart failure with bilateral large pleural effusions. Patient status post thoracentesis on the left. Continue Lasix to 40 mg IV twice a day with good response. 3. Bilateral pleural effusions. Status post thoracentesis on the left. Patient absolutely refuses thoracentesis on the other side. Better breath sounds today with increased dose of Lasix. 4. Chronic atrial fibrillation on Cardizem and amiodarone. Aspirin only for anticoagulation. Added low-dose bisoprolol for heart rate control yesterday. 5. Hypokalemia replace potassium again today 6. History of CVA on aspirin 7. History of gout on allopurinol 8. Depression on Celexa 9. GERD on Protonix 10. Weakness. Physical therapy recommends rehabilitation. 11. Anemia. Likely chronic in nature. Send off a ferritin tomorrow.  Code Status:     Code Status Orders        Start     Ordered   04/30/17 1226  Do not attempt resuscitation (DNR)  Continuous    Question Answer Comment  In the event of cardiac or respiratory ARREST Do not call a "code blue"   In the event of  cardiac or respiratory ARREST Do not perform Intubation, CPR, defibrillation or ACLS   In the event of cardiac or respiratory ARREST Use medication by any route, position, wound care, and other measures to relive pain and suffering. May use oxygen, suction and manual treatment of airway obstruction as needed for comfort.   Comments RN may pronounce      04/30/17 1225    Code Status History    Date Active Date Inactive Code Status Order ID Comments User Context   03/08/2017  6:16 PM 03/09/2017  7:07 PM Full Code [id number]  [name], MD ED   09/01/2016  3:45 AM 09/07/2016  8:45 PM Full Code [id number]  Hugelmeyer, Ubaldo Glassing, DO Inpatient    Advance Directive Documentation     Most Recent Value  Type of Advance Directive  Healthcare Power of Attorney  Pre-existing out of facility DNR order (yellow form or pink MOST form)  -  "MOST" Form in Place?  -     Family Communication: Spoke with daughter at the bedside Disposition Plan: potentially back to twin [facility] tomorrow versus the following day depending on clinical course.  Time spent: 28 minutes  Victoria Contreras  [employer]

## 2017-05-09 LAB — BASIC METABOLIC PANEL
Anion gap: 9 (ref 5–15)
BUN: 11 mg/dL (ref 6–20)
CALCIUM: 8.3 mg/dL — AB (ref 8.9–10.3)
CHLORIDE: 97 mmol/L — AB (ref 101–111)
CO2: 31 mmol/L (ref 22–32)
CREATININE: 0.71 mg/dL (ref 0.44–1.00)
Glucose, Bld: 89 mg/dL (ref 65–99)
Potassium: 3.7 mmol/L (ref 3.5–5.1)
SODIUM: 137 mmol/L (ref 135–145)

## 2017-05-09 LAB — BODY FLUID CULTURE: Culture: NO GROWTH

## 2017-05-09 LAB — FERRITIN: FERRITIN: 35 ng/mL (ref 11–307)

## 2017-05-09 LAB — HEMOGLOBIN: HEMOGLOBIN: 8.6 g/dL — AB (ref 12.0–16.0)

## 2017-05-09 LAB — VITAMIN B12: VITAMIN B 12: 198 pg/mL (ref 180–914)

## 2017-05-09 MED ORDER — FERROUS SULFATE 325 (65 FE) MG PO TABS
325.0000 mg | ORAL_TABLET | Freq: Two times a day (BID) | ORAL | Status: DC
  Administered 2017-05-09 – 2017-05-10 (×2): 325 mg via ORAL
  Filled 2017-05-09 (×2): qty 1

## 2017-05-09 NOTE — Progress Notes (Signed)
Patient ID: [name], female   DOB: 1924-10-11, 83 y.o.   MRN: [id number]    [facility] PROGRESS NOTE  TIEARA FLITTON OIZ:[id number] DOB: [birth year] DOA: 04/30/2017 PCP: [name], MD  HPI/Subjective: Patient again states she feels okay. States she is okay with her breathing. When I walked in she was on 5 L and I dialed her down to 3 L.  Objective: Vitals:   05/09/17 0808 05/09/17 1040  BP: 119/70   Pulse: (!) 101 96  Resp: 18   Temp: 98 F (36.7 C)   SpO2: 95% 94%    Filed Weights   05/07/17 0508 05/08/17 0507 05/09/17 0500  Weight: 86 kg (189 lb 8 oz) 84.5 kg (186 lb 4.8 oz) 83.6 kg (184 lb 4.8 oz)    ROS: Review of Systems  Constitutional: Negative for chills and fever.  Eyes: Negative for blurred vision.  Respiratory: Negative for cough and shortness of breath.   Cardiovascular: Negative for chest pain.  Gastrointestinal: Negative for abdominal pain, constipation, diarrhea, nausea and vomiting.  Genitourinary: Negative for dysuria.  Musculoskeletal: Negative for joint pain.  Neurological: Negative for dizziness and headaches.   Exam: Physical Exam  Constitutional: She is oriented to person, place, and time.  HENT:  Nose: No mucosal edema.  Mouth/Throat: No oropharyngeal exudate or posterior oropharyngeal edema.  Eyes: Pupils are equal, round, and reactive to light. Conjunctivae, EOM and lids are normal.  Neck: No JVD present. Carotid bruit is not present. No edema present. No thyroid mass and no thyromegaly present.  Cardiovascular: S1 normal, S2 normal and normal heart sounds.  An irregularly irregular rhythm present. Exam reveals no gallop.   No murmur heard. Pulses:      Dorsalis pedis pulses are 2+ on the right side, and 2+ on the left side.  Respiratory: No respiratory distress. She has decreased breath sounds in the right lower field and the left lower field. She has no wheezes. She has rhonchi in the right lower field. She has no rales.   GI: Soft. Bowel sounds are normal. There is no tenderness.  Musculoskeletal:       Right ankle: She exhibits swelling.       Left ankle: She exhibits swelling.  Lymphadenopathy:    She has no cervical adenopathy.  Neurological: She is alert and oriented to person, place, and time. No cranial nerve deficit.  Skin: Skin is warm. No rash noted. Nails show no clubbing.  Psychiatric: She has a normal mood and affect.      Data Reviewed: Basic Metabolic Panel:  Recent Labs Lab 05/02/17 1754  05/03/17 0436 05/04/17 0359 05/05/17 0551 05/07/17 0813 05/08/17 0429 05/09/17 0531  NA  --   --  140 139 138 138  --  137  K  --   < > 3.1* 3.5 3.4* 3.5 4.2 3.7  CL  --   --  101 99* 97* 97*  --  97*  CO2  --   --  33* 32 33* 33*  --  31  GLUCOSE  --   --  106* 102* 98 98  --  89  BUN  --   --  10 11 10 11   --  11  CREATININE  --   < > 0.69 0.52 0.66 0.62 0.80 0.71  CALCIUM  --   --  8.3* 8.2* 8.4* 8.5*  --  8.3*  MG 1.7  --   --   --  1.6*  --   --   --   < > =  values in this interval not displayed. CBC:  Recent Labs Lab 05/05/17 0551 05/08/17 0429 05/09/17 0531  WBC 8.5 9.6  --   HGB 8.7* 8.9* 8.6*  HCT 28.4* 29.1*  --   MCV 65.3* 65.9*  --   PLT 244 250  --    BNP (last 3 results)  Recent Labs  10/30/16 1548 10/31/16 1227 04/30/17 0921  BNP 346.0* 288.0* 537.0*      Recent Results (from the past 240 hour(s))  Urine culture     Status: Abnormal   Collection Time: 04/30/17  9:20 AM  Result Value Ref Range Status   Specimen Description URINE, CATHETERIZED  Final   Special Requests NONE  Final   Culture MULTIPLE SPECIES PRESENT, SUGGEST RECOLLECTION (A)  Final   Report Status 05/01/2017 FINAL  Final  Culture, blood (Routine x 2)     Status: None   Collection Time: 04/30/17  9:21 AM  Result Value Ref Range Status   Specimen Description BLOOD BLOOD LEFT FOREARM  Final   Special Requests   Final    BOTTLES DRAWN AEROBIC AND ANAEROBIC Blood Culture adequate volume    Culture NO GROWTH 5 DAYS  Final   Report Status 05/05/2017 FINAL  Final  Culture, blood (Routine x 2)     Status: None   Collection Time: 04/30/17  9:21 AM  Result Value Ref Range Status   Specimen Description BLOOD LEFT ANTECUBITAL  Final   Special Requests   Final    BOTTLES DRAWN AEROBIC AND ANAEROBIC Blood Culture adequate volume   Culture NO GROWTH 5 DAYS  Final   Report Status 05/05/2017 FINAL  Final  MRSA PCR Screening     Status: None   Collection Time: 04/30/17 12:34 PM  Result Value Ref Range Status   MRSA by PCR NEGATIVE NEGATIVE Final    Comment:        The GeneXpert MRSA Assay (FDA approved for NASAL specimens only), is one component of a comprehensive MRSA colonization surveillance program. It is not intended to diagnose MRSA infection nor to guide or monitor treatment for MRSA infections.   Body fluid culture     Status: None   Collection Time: 05/05/17  2:50 PM  Result Value Ref Range Status   Specimen Description PLEURAL  Final   Special Requests NONE  Final   Gram Stain   Final    FEW WBC PRESENT, PREDOMINANTLY MONONUCLEAR NO ORGANISMS SEEN    Culture   Final    NO GROWTH 3 DAYS Performed at [facility], [address], [place], [place] [address]    Report Status 05/09/2017 FINAL  Final     Studies: Dg Chest Port 1 View  Result Date: 05/08/2017 CLINICAL DATA:  Follow-up pleural effusion EXAM: PORTABLE CHEST 1 VIEW COMPARISON:  05/05/2017 chest radiograph. FINDINGS: Stable cardiomediastinal silhouette with cardiomegaly and aortic atherosclerosis. No pneumothorax. Small right pleural effusion appears stable accounting for differences in positioning. Stable trace left pleural effusion. Mild pulmonary edema, stable. Stable patchy bibasilar lung opacities. IMPRESSION: 1. Stable small right and trace left pleural effusions. 2. Stable mild congestive heart failure. 3. Stable bibasilar lung opacities, favor atelectasis. Electronically Signed   By: Ilona Sorrel M.D.   On: 05/08/2017 10:18    Scheduled Meds: . allopurinol  300 mg Oral Daily  . amiodarone  200 mg Oral Daily  . aspirin EC  81 mg Oral Daily  . bisoprolol  5 mg Oral Daily  . chlorhexidine  15 mL Mouth Rinse BID  . citalopram  20 mg Oral Daily  . diltiazem  300 mg Oral Daily  . docusate sodium  100 mg Oral BID  . enoxaparin (LOVENOX) injection  40 mg Subcutaneous Q24H  . estradiol  1 Applicatorful Vaginal Q M,W,F  . furosemide  40 mg Intravenous BID  . hydrocortisone cream  1 application Topical QID  . mouth rinse  15 mL Mouth Rinse q12n4p  . pantoprazole  40 mg Oral QAC breakfast  . polyethylene glycol  17 g Oral Daily  . potassium chloride  20 mEq Oral Daily    Assessment/Plan:  1. Acute hypoxic respiratory failure. I turned the patient's oxygen down to 3 L nasal cannula. Hopefully can get down to 2 L nasal cannula. Likely discharge to 20 legs skilled portion tomorrow morning 2. Acute on chronic diastolic congestive heart failure with bilateral large pleural effusions. Patient status post thoracentesis on the left. Continue Lasix to 40 mg IV twice a day. 3. Bilateral pleural effusions. Status post thoracentesis on the left. Patient absolutely refuses thoracentesis on the other side. Better breath sounds today with increased dose of Lasix. 4. Chronic atrial fibrillation on Cardizem, bisoprolol and amiodarone. Aspirin only for anticoagulation. 5. Hypokalemia replace daily 6. History of CVA on aspirin 7. History of gout on allopurinol 8. Depression on Celexa 9. GERD on Protonix 10. Weakness. Physical therapy recommends rehabilitation. 11. Anemia iron deficiency. Start ferrous sulfate  Code Status:     Code Status Orders        Start     Ordered   04/30/17 1226  Do not attempt resuscitation (DNR)  Continuous    Question Answer Comment  In the event of cardiac or respiratory ARREST Do not call a "code blue"   In the event of cardiac or respiratory ARREST Do not  perform Intubation, CPR, defibrillation or ACLS   In the event of cardiac or respiratory ARREST Use medication by any route, position, wound care, and other measures to relive pain and suffering. May use oxygen, suction and manual treatment of airway obstruction as needed for comfort.   Comments RN may pronounce      04/30/17 1225    Code Status History    Date Active Date Inactive Code Status Order ID Comments User Context   03/08/2017  6:16 PM 03/09/2017  7:07 PM Full Code [id number]  [name], MD ED   09/01/2016  3:45 AM 09/07/2016  8:45 PM Full Code [id number]  Hugelmeyer, Ubaldo Glassing, DO Inpatient    Advance Directive Documentation     Most Recent Value  Type of Advance Directive  Healthcare Power of Attorney  Pre-existing out of facility DNR order (yellow form or pink MOST form)  -  "MOST" Form in Place?  -     Family Communication: Spoke with daughter on the phone Disposition Plan: hopefully back to [facility] [place] tomorrow skilled portion  Time spent: 26 minutes  Leslye Peer, [employer]

## 2017-05-09 NOTE — Care Management Important Message (Signed)
Important Message  Patient Details  Name: Victoria Contreras MRN: [id number] Date of Birth: March 01, 1925   Medicare Important Message Given:  Yes Signed IM notice given    Katrina Stack, RN 05/09/2017, 2:48 PM

## 2017-05-09 NOTE — Care Management (Signed)
Oxygen requirements have decreased and been of high flow for over 48 hours.  If remains stable, anticipate discharge within next 24 hours

## 2017-05-10 ENCOUNTER — Ambulatory Visit: Payer: Medicare Other | Admitting: Family

## 2017-05-10 DIAGNOSIS — C9110 Chronic lymphocytic leukemia of B-cell type not having achieved remission: Secondary | ICD-10-CM | POA: Diagnosis not present

## 2017-05-10 DIAGNOSIS — F39 Unspecified mood [affective] disorder: Secondary | ICD-10-CM | POA: Diagnosis not present

## 2017-05-10 DIAGNOSIS — I5040 Unspecified combined systolic (congestive) and diastolic (congestive) heart failure: Secondary | ICD-10-CM | POA: Diagnosis not present

## 2017-05-10 DIAGNOSIS — Z7401 Bed confinement status: Secondary | ICD-10-CM | POA: Diagnosis not present

## 2017-05-10 DIAGNOSIS — Z741 Need for assistance with personal care: Secondary | ICD-10-CM | POA: Diagnosis not present

## 2017-05-10 DIAGNOSIS — J90 Pleural effusion, not elsewhere classified: Secondary | ICD-10-CM | POA: Diagnosis not present

## 2017-05-10 DIAGNOSIS — J9601 Acute respiratory failure with hypoxia: Secondary | ICD-10-CM | POA: Diagnosis not present

## 2017-05-10 DIAGNOSIS — R0602 Shortness of breath: Secondary | ICD-10-CM | POA: Diagnosis not present

## 2017-05-10 DIAGNOSIS — I5023 Acute on chronic systolic (congestive) heart failure: Secondary | ICD-10-CM | POA: Diagnosis not present

## 2017-05-10 DIAGNOSIS — D649 Anemia, unspecified: Secondary | ICD-10-CM | POA: Diagnosis not present

## 2017-05-10 DIAGNOSIS — M6281 Muscle weakness (generalized): Secondary | ICD-10-CM | POA: Diagnosis not present

## 2017-05-10 DIAGNOSIS — I482 Chronic atrial fibrillation: Secondary | ICD-10-CM | POA: Diagnosis not present

## 2017-05-10 DIAGNOSIS — I5022 Chronic systolic (congestive) heart failure: Secondary | ICD-10-CM | POA: Diagnosis not present

## 2017-05-10 DIAGNOSIS — I4891 Unspecified atrial fibrillation: Secondary | ICD-10-CM | POA: Diagnosis not present

## 2017-05-10 DIAGNOSIS — R2681 Unsteadiness on feet: Secondary | ICD-10-CM | POA: Diagnosis not present

## 2017-05-10 DIAGNOSIS — I509 Heart failure, unspecified: Secondary | ICD-10-CM | POA: Diagnosis not present

## 2017-05-10 DIAGNOSIS — J9621 Acute and chronic respiratory failure with hypoxia: Secondary | ICD-10-CM | POA: Diagnosis not present

## 2017-05-10 LAB — BASIC METABOLIC PANEL
ANION GAP: 9 (ref 5–15)
BUN: 12 mg/dL (ref 6–20)
CALCIUM: 8.4 mg/dL — AB (ref 8.9–10.3)
CHLORIDE: 97 mmol/L — AB (ref 101–111)
CO2: 29 mmol/L (ref 22–32)
Creatinine, Ser: 0.74 mg/dL (ref 0.44–1.00)
GFR calc non Af Amer: >60 mL/min (ref >60)
Glucose, Bld: 94 mg/dL (ref 65–99)
POTASSIUM: 3.7 mmol/L (ref 3.5–5.1)
SODIUM: 135 mmol/L (ref 135–145)

## 2017-05-10 MED ORDER — ONDANSETRON HCL 4 MG/2ML IJ SOLN
INTRAMUSCULAR | Status: AC
  Filled 2017-05-10: qty 2

## 2017-05-10 MED ORDER — SODIUM CHLORIDE 0.9% FLUSH
3.0000 mL | Freq: Two times a day (BID) | INTRAVENOUS | Status: DC
  Administered 2017-05-10: 3 mL via INTRAVENOUS

## 2017-05-10 MED ORDER — VALSARTAN 40 MG PO TABS: 40.0000 mg | ORAL_TABLET | Freq: Every day | ORAL | 0 refills | Status: DC

## 2017-05-10 MED ORDER — FUROSEMIDE 40 MG PO TABS: 40.0000 mg | ORAL_TABLET | Freq: Two times a day (BID) | ORAL | Status: DC

## 2017-05-10 MED ORDER — FERROUS SULFATE 325 (65 FE) MG PO TABS: 325.0000 mg | ORAL_TABLET | Freq: Two times a day (BID) | ORAL | 0 refills | Status: DC

## 2017-05-10 MED ORDER — ASPIRIN 81 MG PO TBEC: 81.0000 mg | DELAYED_RELEASE_TABLET | Freq: Every day | ORAL | 0 refills | Status: AC

## 2017-05-10 MED ORDER — FUROSEMIDE 40 MG PO TABS: 40.0000 mg | ORAL_TABLET | Freq: Two times a day (BID) | ORAL | 0 refills | Status: AC

## 2017-05-10 MED ORDER — ONDANSETRON HCL 4 MG/2ML IJ SOLN
4.0000 mg | Freq: Four times a day (QID) | INTRAMUSCULAR | Status: DC | PRN
  Administered 2017-05-10: 4 mg via INTRAVENOUS

## 2017-05-10 MED ORDER — DOCUSATE SODIUM 100 MG PO CAPS: 100.0000 mg | ORAL_CAPSULE | Freq: Two times a day (BID) | ORAL | 0 refills | Status: DC

## 2017-05-10 MED ORDER — BISOPROLOL FUMARATE 5 MG PO TABS: 5.0000 mg | ORAL_TABLET | Freq: Every day | ORAL | 0 refills | Status: DC

## 2017-05-10 NOTE — Progress Notes (Signed)
Patient stated she is being discharged back to assisted living at [facility]. Patient stated she likes it there that it is very nice and [place] being in the sunshine when able. Patient spoke about her 3 daughters and her one son. She is proud of each of her children. CH offered emotional support and prayer. [place] left room as patient had received a phone call.   05/10/17 1000  Clinical Encounter Type  Visited With Patient  Visit Type Initial;Spiritual support  Referral From Nurse  Spiritual Encounters  Spiritual Needs Emotional

## 2017-05-10 NOTE — Discharge Summary (Signed)
[facility] [place] NAME: Victoria Contreras    MR#:  [id number]  DATE OF BIRTH:  1925-01-08  DATE OF ADMISSION:  04/30/2017 ADMITTING PHYSICIAN: Nicholes Mango, MD  DATE OF DISCHARGE: 05/10/2017  PRIMARY CARE PHYSICIAN: [name], MD    ADMISSION DIAGNOSIS:  Acute cystitis without hematuria [N30.00] Acute on chronic congestive heart failure, unspecified heart failure type ([place]) [I50.9]  DISCHARGE DIAGNOSIS:  Active Problems:   Acute respiratory failure ([place])   SECONDARY DIAGNOSIS:   Past Medical History:  Diagnosis Date  . Atrial fibrillation ([place])   . Chronic diastolic heart failure ([place])   . CLL (chronic lymphoid leukemia) in relapse ([place]) 04/17/2015  . CVA (cerebral infarction)   . Frequent nosebleeds   . Gout   . Hemangiopericytoma   . HLD (hyperlipidemia)   . HTN (hypertension)   . Mood disorder ([place])     HOSPITAL COURSE:   1. Acute hypoxic respiratory failure. The patient was on high flow nasal cannula most of the hospital course. Once we got more aggressive with diuresis and after thoracentesis she was able to come down to nasal cannula. Currently she is on 2 L nasal cannula. Continue oxygen for pulse ox less than 88%. 2. Acute on chronic diastolic congestive heart failure with bilateral pleural effusions. The patient is status post her centesis on the left. When I saw her I increased Lasix to 40 mg IV twice a day with good response. Her kidney function stayed stable. I will switch over to Lasix 40 mg by mouth twice a day. Continue to monitor BMP on a weekly basis. Follow-up at the heart failure clinic. 3. Bilateral pleural effusions. Patient is status post thoracentesis on the left. Patient absolutely refused thoracentesis on the other side and stated she will never have the procedure again. She did respond to Lasix IV. 4. Chronic atrial fibrillation on Cardizem, amiodarone and low-dose bisoprolol. Aspirin only for  anticoagulation with history of falls. 5. Hypokalemia continue to replace on Lasix. 6. History of CVA on aspirin 7. History of gout on allopurinol 8. Depression on Celexa 9. GERD on Protonix 10. Weakness. Physical therapy recommended rehabilitation. 11. Iron deficiency anemia. Watch hemoglobin on every 2 weeks. Iron started. With respiratory status not a good candidate for procedures. If hemoglobin drops may have to stop aspirin also. 12. Patient is a DO NOT RESUSCITATE. Can consider palliative care consultation that facility.  DISCHARGE CONDITIONS:   Fair  CONSULTS OBTAINED:  Treatment Team:  Corey Skains, MD  DRUG ALLERGIES:   Allergies  Allergen Reactions  . Codeine     Other reaction(s): Unknown  . Sulfa Antibiotics Other (See Comments)    Deathly sick   . Tape Itching    Other reaction(s): Itching of Skin    DISCHARGE MEDICATIONS:   Current Discharge Medication List    START taking these medications   Details  aspirin EC 81 MG EC tablet Take 1 tablet (81 mg total) by mouth daily. Qty: 30 tablet, Refills: 0    bisoprolol (ZEBETA) 5 MG tablet Take 1 tablet (5 mg total) by mouth daily. Qty: 30 tablet, Refills: 0    docusate sodium (COLACE) 100 MG capsule Take 1 capsule (100 mg total) by mouth 2 (two) times daily. Qty: 60 capsule, Refills: 0    ferrous sulfate 325 (65 FE) MG tablet Take 1 tablet (325 mg total) by mouth 2 (two) times daily with a meal. Qty: 60 tablet, Refills: 0  CONTINUE these medications which have CHANGED   Details  furosemide (LASIX) 40 MG tablet Take 1 tablet (40 mg total) by mouth 2 (two) times daily. Qty: 60 tablet, Refills: 0      CONTINUE these medications which have NOT CHANGED   Details  acetaminophen (TYLENOL) 650 MG CR tablet Take 650 mg by mouth 2 (two) times daily. Take 1 tablet at 0700 and 1 tablet at 2100    allopurinol (ZYLOPRIM) 300 MG tablet Take 300 mg by mouth daily.    alum & mag hydroxide-simeth  (MAALOX/MYLANTA) 200-200-20 MG/5ML suspension Take 30 mLs by mouth every 4 (four) hours as needed for indigestion or heartburn.     amiodarone (PACERONE) 200 MG tablet Take 200 mg by mouth daily.    bismuth subsalicylate (PEPTO BISMOL) 262 MG/15ML suspension Take 10 mLs by mouth 3 (three) times daily as needed.     carbamide peroxide (DEBROX) 6.5 % OTIC solution Place 5 drops into both ears as needed.    citalopram (CELEXA) 20 MG tablet Take 20 mg by mouth daily.    Dextromethorphan-Guaifenesin (TUSSIN DM) 10-100 MG/5ML liquid Take 10 mLs by mouth every 4 (four) hours as needed.    diltiazem (CARDIZEM CD) 300 MG 24 hr capsule Take 1 capsule (300 mg total) by mouth daily. Qty: 30 capsule, Refills: 0    estradiol (ESTRACE) 0.1 MG/GM vaginal cream Place 1 Applicatorful vaginally every Monday, Wednesday, and Friday.    hydrocortisone cream 0.5 % Apply 1 application topically 4 (four) times daily. Under nose; 4 times daily (0700,1130,1630,2100)    magnesium hydroxide (MILK OF MAGNESIA) 400 MG/5ML suspension Take 30 mLs by mouth daily as needed for mild constipation.    nystatin (NYSTATIN) powder Apply 1 Bottle topically 2 (two) times daily as needed.    ondansetron (ZOFRAN) 4 MG tablet Take 4 mg by mouth 3 (three) times daily as needed for nausea or vomiting.    pantoprazole (PROTONIX) 40 MG tablet Take 1 tablet (40 mg total) by mouth daily before breakfast. Qty: 30 tablet, Refills: 1    potassium chloride (K-DUR) 10 MEQ tablet Take 2 tablets (20 mEq total) by mouth daily. Qty: 90 tablet, Refills: 0    sucralfate (CARAFATE) 1 g tablet Take 1 g by mouth 4 (four) times daily. Take 1 tab at 0700, 1 tab at 1130, 1 tab at 1630, 1 tab at 2100      STOP taking these medications     diphenhydrAMINE (BENADRYL) 25 mg capsule          DISCHARGE INSTRUCTIONS:   Follow-up Dr. at [facility] one day  If you experience worsening of your admission symptoms, develop shortness of breath, life  threatening emergency, suicidal or homicidal thoughts you must seek medical attention immediately by calling 911 or calling your MD immediately  if symptoms less severe.  You Must read complete instructions/literature along with all the possible adverse reactions/side effects for all the Medicines you take and that have been prescribed to you. Take any new Medicines after you have completely understood and accept all the possible adverse reactions/side effects.   Please note  You were cared for by a hospitalist during your hospital stay. If you have any questions about your discharge medications or the care you received while you were in the hospital after you are discharged, you can call the unit and asked to speak with the hospitalist on call if the hospitalist that took care of you is not available. Once you are discharged,  your primary care physician will handle any further medical issues. Please note that NO REFILLS for any discharge medications will be authorized once you are discharged, as it is imperative that you return to your primary care physician (or establish a relationship with a primary care physician if you do not have one) for your aftercare needs so that they can reassess your need for medications and monitor your lab values.    Today   CHIEF COMPLAINT:   Chief Complaint  Patient presents with  . Respiratory Distress  . Code Sepsis    HISTORY OF PRESENT ILLNESS:  Deziree Mokry  is a 83 y.o. female presented with respiratory distress   VITAL SIGNS:  Blood pressure 113/62, pulse 92, temperature 98 F (36.7 C), temperature source Oral, resp. rate 18, height 5\' 9"  (1.753 m), weight 82.9 kg (182 lb 11.2 oz), SpO2 90 %.    PHYSICAL EXAMINATION:  GENERAL:  83 y.o.-year-old patient lying in the bed with no acute distress.  EYES: Pupils equal, round, reactive to light and accommodation. No scleral icterus. Extraocular muscles intact.  HEENT: Head atraumatic, normocephalic.  Oropharynx and nasopharynx clear.  NECK:  Supple, no jugular venous distention. No thyroid enlargement, no tenderness.  LUNGS: Decreased breath sounds bilaterally, no wheezing, rales,rhonchi or crepitation. No use of accessory muscles of respiration.  CARDIOVASCULAR: S1, S2 normal. No murmurs, rubs, or gallops.  ABDOMEN: Soft, non-tender, non-distended. Bowel sounds present. No organomegaly or mass.  EXTREMITIES: Trace edema, no cyanosis, or clubbing.  NEUROLOGIC: Cranial nerves II through XII are intact. Muscle strength 5/5 in all extremities. Sensation intact. Gait not checked.  PSYCHIATRIC: The patient is alert and oriented x 3.  SKIN: No obvious rash, lesion, or ulcer.   DATA REVIEW:   CBC  Recent Labs Lab 05/08/17 0429 05/09/17 0531  WBC 9.6  --   HGB 8.9* 8.6*  HCT 29.1*  --   PLT 250  --     Chemistries   Recent Labs Lab 05/05/17 0551  05/10/17 0737  NA 138  < > 135  K 3.4*  < > 3.7  CL 97*  < > 97*  CO2 33*  < > 29  GLUCOSE 98  < > 94  BUN 10  < > 12  CREATININE 0.66  < > 0.74  CALCIUM 8.4*  < > 8.4*  MG 1.6*  --   --   < > = values in this interval not displayed.   Microbiology Results  Results for orders placed or performed during the hospital encounter of 04/30/17  Urine culture     Status: Abnormal   Collection Time: 04/30/17  9:20 AM  Result Value Ref Range Status   Specimen Description URINE, CATHETERIZED  Final   Special Requests NONE  Final   Culture MULTIPLE SPECIES PRESENT, SUGGEST RECOLLECTION (A)  Final   Report Status 05/01/2017 FINAL  Final  Culture, blood (Routine x 2)     Status: None   Collection Time: 04/30/17  9:21 AM  Result Value Ref Range Status   Specimen Description BLOOD BLOOD LEFT FOREARM  Final   Special Requests   Final    BOTTLES DRAWN AEROBIC AND ANAEROBIC Blood Culture adequate volume   Culture NO GROWTH 5 DAYS  Final   Report Status 05/05/2017 FINAL  Final  Culture, blood (Routine x 2)     Status: None   Collection  Time: 04/30/17  9:21 AM  Result Value Ref Range Status   Specimen Description BLOOD LEFT  ANTECUBITAL  Final   Special Requests   Final    BOTTLES DRAWN AEROBIC AND ANAEROBIC Blood Culture adequate volume   Culture NO GROWTH 5 DAYS  Final   Report Status 05/05/2017 FINAL  Final  MRSA PCR Screening     Status: None   Collection Time: 04/30/17 12:34 PM  Result Value Ref Range Status   MRSA by PCR NEGATIVE NEGATIVE Final    Comment:        The GeneXpert MRSA Assay (FDA approved for NASAL specimens only), is one component of a comprehensive MRSA colonization surveillance program. It is not intended to diagnose MRSA infection nor to guide or monitor treatment for MRSA infections.   Body fluid culture     Status: None   Collection Time: 05/05/17  2:50 PM  Result Value Ref Range Status   Specimen Description PLEURAL  Final   Special Requests NONE  Final   Gram Stain   Final    FEW WBC PRESENT, PREDOMINANTLY MONONUCLEAR NO ORGANISMS SEEN    Culture   Final    NO GROWTH 3 DAYS Performed at [facility], [address] [place], [place] [address]    Report Status 05/09/2017 FINAL  Final    RADIOLOGY:  Dg Chest Port 1 View  Result Date: 05/08/2017 CLINICAL DATA:  Follow-up pleural effusion EXAM: PORTABLE CHEST 1 VIEW COMPARISON:  05/05/2017 chest radiograph. FINDINGS: Stable cardiomediastinal silhouette with cardiomegaly and aortic atherosclerosis. No pneumothorax. Small right pleural effusion appears stable accounting for differences in positioning. Stable trace left pleural effusion. Mild pulmonary edema, stable. Stable patchy bibasilar lung opacities. IMPRESSION: 1. Stable small right and trace left pleural effusions. 2. Stable mild congestive heart failure. 3. Stable bibasilar lung opacities, favor atelectasis. Electronically Signed   By: Ilona Sorrel M.D.   On: 05/08/2017 10:18     Management plans discussed with the patient, family and they are in agreement.  CODE  STATUS:     Code Status Orders        Start     Ordered   04/30/17 1226  Do not attempt resuscitation (DNR)  Continuous    Question Answer Comment  In the event of cardiac or respiratory ARREST Do not call a "code blue"   In the event of cardiac or respiratory ARREST Do not perform Intubation, CPR, defibrillation or ACLS   In the event of cardiac or respiratory ARREST Use medication by any route, position, wound care, and other measures to relive pain and suffering. May use oxygen, suction and manual treatment of airway obstruction as needed for comfort.   Comments RN may pronounce      04/30/17 1225    Code Status History    Date Active Date Inactive Code Status Order ID Comments User Context   03/08/2017  6:16 PM 03/09/2017  7:07 PM Full Code [id number]  [name], MD ED   09/01/2016  3:45 AM 09/07/2016  8:45 PM Full Code [id number]  Hugelmeyer, Ubaldo Glassing, DO Inpatient    Advance Directive Documentation     Most Recent Value  Type of Advance Directive  Healthcare Power of Attorney  Pre-existing out of facility DNR order (yellow form or pink MOST form)  -  "MOST" Form in Place?  -      TOTAL TIME TAKING CARE OF THIS PATIENT: 35 minutes.    Loletha Grayer M.D on 05/10/2017 at 8:44 AM  Between 7am to 6pm - Pager - [phone]  After 6pm go to [email or web address] -  password [email or web address]  [employer] Office  [phone]  CC: Primary care physician; [name], MD

## 2017-05-10 NOTE — Progress Notes (Signed)
Went over discharge instructions with the patient. Called report to [facility] in [place]. Also called EMS for transport, will send discharge paper and prescriptions with them. Discontinue peripheral IV. Patient ready for transport.

## 2017-05-10 NOTE — Clinical Social Work Note (Addendum)
CSW was informed that patient is ready for discharge today.  CSW contacted [facility] and they can accept patient.  Patient will be going to room 217, and report is to be called at [phone].  CSW updated patient's son Broadus John at [phone], who will also inform his sister Mardene Celeste.    Patient to be d/c'ed today to [facility] room 217.  Patient and family agreeable to plans will transport via ems RN to call report.  Jones Broom. [place]ks, MSW, [place]  05/10/2017 9:27 AM

## 2017-05-10 NOTE — NC FL2 (Signed)
[place] LEVEL OF CARE SCREENING TOOL     IDENTIFICATION  Patient Name: Victoria Contreras Birthdate: 1924/10/08 Sex: female Admission Date (Current Location): 04/30/2017  [place] and [place] Number:  Engineering geologist and Address:  [facility], [address], [place], [place] [address]      Provider Number: [id number]  Attending Physician Name and Address:  Loletha Grayer, MD  Relative Name and Phone Number:  Lavona Mound (daughter) [phone]    Current Level of Care: Hospital Recommended Level of Care: [place] Prior Approval Number:    Date Approved/Denied: 11/30/12 PASRR Number: [id number]  Discharge Plan: SNF    Current Diagnoses: Patient Active Problem List   Diagnosis Date Noted  . Acute respiratory failure ([place]) 04/30/2017  . Anemia 03/08/2017  . Chronic diastolic heart failure ([place])   . Mood disorder ([place])   . Persistent atrial fibrillation ([place]) 08/31/2016  . Dysuria 11/06/2015  . Cystocele, grade 2 11/06/2015  . Atrophic vaginitis 11/06/2015  . CLL (chronic lymphocytic leukemia) ([place]) 04/17/2015  . 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    Orientation RESPIRATION BLADDER Height & Weight     Self, Time, Situation, Place  O2 (2L) Incontinent Weight: 182 lb 11.2 oz (82.9 kg) Height:  5\' 9"  (175.3 cm)  BEHAVIORAL SYMPTOMS/MOOD NEUROLOGICAL BOWEL NUTRITION STATUS      Continent Diet (Cardiac)  AMBULATORY STATUS COMMUNICATION OF NEEDS Skin   Limited Assist Verbally Normal                       Personal Care Assistance Level of Assistance  Bathing, Feeding, Dressing Bathing Assistance: Limited assistance Feeding assistance: Independent Dressing Assistance: Limited assistance     Functional Limitations Info  Sight, Hearing, Speech Sight Info: Adequate Hearing Info: Adequate Speech Info: Adequate    SPECIAL CARE FACTORS FREQUENCY  PT (By licensed PT)     PT Frequency: 5x a week               Contractures Contractures Info: Not present    Additional Factors Info  Code Status, Allergies, Psychotropic Code Status Info: DNR Allergies Info: CODEINE, SULFA ANTIBIOTICS, TAPE Psychotropic Info: citalopram (CELEXA) tablet 20 mg         Current Medications (05/10/2017):  This is the current hospital active medication list Current Facility-Administered Medications  Medication Dose Route Frequency Provider Last Rate Last Dose  . allopurinol (ZYLOPRIM) tablet 300 mg  300 mg Oral Daily Henreitta Leber, MD   300 mg at 05/09/17 1026  . amiodarone (PACERONE) tablet 200 mg  200 mg Oral Daily Conforti, John, DO   200 mg at 05/09/17 1025  . aspirin EC tablet 81 mg  81 mg Oral Daily Gouru, Aruna, MD   81 mg at 05/09/17 1025  . bisoprolol (ZEBETA) tablet 5 mg  5 mg Oral Daily Loletha Grayer, MD   5 mg at 05/09/17 1026  . chlorhexidine (PERIDEX) 0.12 % solution 15 mL  15 mL Mouth Rinse BID Gouru, Aruna, MD   15 mL at 05/09/17 1023  . citalopram (CELEXA) tablet 20 mg  20 mg Oral Daily Henreitta Leber, MD   20 mg at 05/09/17 1024  . diltiazem (CARDIZEM CD) 24 hr capsule 300 mg  300 mg Oral Daily Henreitta Leber, MD   300 mg at 05/09/17 1024  . docusate sodium (COLACE) capsule 100 mg  100 mg Oral BID Henreitta Leber, MD   100 mg at 05/09/17 2126  .  enoxaparin (LOVENOX) injection 40 mg  40 mg Subcutaneous Q24H Henreitta Leber, MD   40 mg at 05/09/17 2126  . estradiol (ESTRACE) vaginal cream 1 Applicatorful  1 Applicatorful Vaginal Q M,W,F Gouru, Aruna, MD   1 Applicatorful at 16/10/96 1027  . ferrous sulfate tablet 325 mg  325 mg Oral BID WC Loletha Grayer, MD   325 mg at 05/10/17 0748  . furosemide (LASIX) tablet 40 mg  40 mg Oral BID Wieting, Richard, MD      . hydrocortisone cream 0.5 % 1 application  1 application Topical QID Nicholes Mango, MD   1 application at 04/54/09 2126  . ipratropium-albuterol (DUONEB) 0.5-2.5 (3) MG/3ML nebulizer solution 3 mL  3 mL Nebulization Q4H PRN  Varughese, Bincy S, NP      . MEDLINE mouth rinse  15 mL Mouth Rinse q12n4p Gouru, Aruna, MD   15 mL at 05/09/17 1718  . ondansetron (ZOFRAN) 4 MG/2ML injection           . ondansetron (ZOFRAN) injection 4 mg  4 mg Intravenous Q6H PRN Saundra Shelling, MD   4 mg at 05/10/17 0350  . pantoprazole (PROTONIX) EC tablet 40 mg  40 mg Oral QAC breakfast Henreitta Leber, MD   40 mg at 05/10/17 0748  . polyethylene glycol (MIRALAX / GLYCOLAX) packet 17 g  17 g Oral Daily Gouru, Aruna, MD   17 g at 05/09/17 1026  . potassium chloride (KLOR-CON) packet 20 mEq  20 mEq Oral Daily Loletha Grayer, MD   20 mEq at 05/09/17 1024  . sodium chloride flush (NS) 0.9 % injection 3 mL  3 mL Intravenous Q12H Loletha Grayer, MD         Discharge Medications: Please see discharge summary for a list of discharge medications.  Relevant Imaging Results:  Relevant Lab Results:   Additional Information SS# [id number]  Anell Barr

## 2017-05-16 ENCOUNTER — Telehealth: Payer: Self-pay | Admitting: Family

## 2017-05-16 ENCOUNTER — Telehealth: Payer: Self-pay

## 2017-05-16 ENCOUNTER — Ambulatory Visit: Payer: Medicare Other | Admitting: Family

## 2017-05-16 DIAGNOSIS — I5022 Chronic systolic (congestive) heart failure: Secondary | ICD-10-CM | POA: Diagnosis not present

## 2017-05-16 NOTE — Telephone Encounter (Signed)
PLEASE NOTE: All timestamps contained within this report are represented as [place] Standard Time. CONFIDENTIALTY NOTICE: This fax transmission is intended only for the addressee. It contains information that is legally privileged, confidential or otherwise protected from use or disclosure. If you are not the intended recipient, you are strictly prohibited from reviewing, disclosing, copying using or disseminating any of this information or taking any action in reliance on or regarding this information. If you have received this fax in error, please notify us immediately by telephone so that we can arrange for its return to [place]. Phone: [phone], Toll-Free: [phone], Fax: [phone] Page: 1 of 2 Call Id: [id number] [place] Patient Name: Victoria Contreras Gender: Female DOB: [birth year] Age: 83 Y 2 M 1 D Return Phone Number: [phone] (Primary), [phone] (Secondary) City/State/Zip: [place] [address] Client [facility] - Client Client Site [place] Physician Viviana Simpler - MD Who Is Calling Patient / Member / Family / Caregiver Call Type Triage / Clinical Caller Name Earlie Raveling Relationship To Patient Daughter Return Phone Number [phone] (Primary) Chief Complaint Weight Loss Reason for Call Symptomatic / Request for Health Information Initial Comment Caller states mother needs decrease order in lasix because lost 40 lbs in 2 wks; Nurse Assessment Nurse: Adrian Blackwater, RN, Claiborne Billings Date/Time (Eastern Time): 05/14/2017 8:59:28 AM Confirm and document reason for call. If symptomatic, describe symptoms. ---Caller states her mother was released from hospital Tuesday after a CHF flare. She has lost 40 lbs in 2 weeks. Her wt upon admission was 207 and is now 167. There is no order to decrease her Lasix. She is in a rehab facility  and the nurse could not call the doctor because "the situation is not dire". Normal weight set at 188 by MD and her dose right now is 40 mg BID; before hospital admit it was 20mg  BID. Does the PT have any chronic conditions? (i.e. diabetes, asthma, etc.) ---Yes List chronic conditions. ---CHF A fib Heart dz Guidelines Guideline Title Affirmed Question Medication Question Call Caller has URGENT medication question about med that PCP prescribed and triager unable to answer question Disp. Time Eilene Ghazi Time) Disposition Final User 05/14/2017 9:31:20 AM Call PCP Now Yes Adrian Blackwater, RN, Claiborne Billings Referrals REFERRED TO PCP OFFICE Care Advice Given Per Guideline CALL PCP NOW: You need to discuss this with your doctor. I'll page him now. If you haven't heard from the on-call doctor within 30 minutes, call again. CARE ADVICE given per Medication Question Call (Adult) guideline. Comments User: Delana Meyer, RN Date/Time Eilene Ghazi Time): 05/14/2017 9:29:53 AM PLEASE NOTE: All timestamps contained within this report are represented as [place] Standard Time. CONFIDENTIALTY NOTICE: This fax transmission is intended only for the addressee. It contains information that is legally privileged, confidential or otherwise protected from use or disclosure. If you are not the intended recipient, you are strictly prohibited from reviewing, disclosing, copying using or disseminating any of this information or taking any action in reliance on or regarding this information. If you have received this fax in error, please notify us immediately by telephone so that we can arrange for its return to [place]. Phone: [phone], Toll-Free: [phone], Fax: [phone] Page: 2 of 2 Call Id: [id number] Comments VO to Baker Janus, LPN at [facility] facility: Reduce LAsix to 40mg  once a day today and Sunday and check BMP. Call for further instructions Monday. Paging DoctorName Phone DateTime Action Result/Outcome  Notes Owens Loffler - MD  [phone] 05/14/2017 9:12:59 AM Doctor Paged Called On Call Provider - Reached Owens Loffler - MD 05/14/2017 9:20:24 AM Message Result Spoke with On Call - General On call physician gave VO to reduce Lasix to 40 mg once a day and check BMP, then contact PCP Monday only if pt is in [facility] facility; otherwise will need to contact MD for the facility or NH she is in.

## 2017-05-16 NOTE — Telephone Encounter (Signed)
Patient missed her initial appointment at the [facility] 05/16/17. Will attempt to reschedule.

## 2017-05-16 NOTE — Telephone Encounter (Signed)
She is now at 170# and that seems to be close to a dry weight Discussed with daughter Will hold all lasix if weight under 165# and give bid if over 180# Otherwise 40mg  daily

## 2017-05-31 DIAGNOSIS — R2681 Unsteadiness on feet: Secondary | ICD-10-CM | POA: Diagnosis not present

## 2017-05-31 DIAGNOSIS — Z741 Need for assistance with personal care: Secondary | ICD-10-CM | POA: Diagnosis not present

## 2017-06-02 DIAGNOSIS — Z741 Need for assistance with personal care: Secondary | ICD-10-CM | POA: Diagnosis not present

## 2017-06-02 DIAGNOSIS — R2681 Unsteadiness on feet: Secondary | ICD-10-CM | POA: Diagnosis not present

## 2017-06-06 DIAGNOSIS — R2681 Unsteadiness on feet: Secondary | ICD-10-CM | POA: Diagnosis not present

## 2017-06-06 DIAGNOSIS — Z741 Need for assistance with personal care: Secondary | ICD-10-CM | POA: Diagnosis not present

## 2017-06-08 DIAGNOSIS — Z741 Need for assistance with personal care: Secondary | ICD-10-CM | POA: Diagnosis not present

## 2017-06-08 DIAGNOSIS — R2681 Unsteadiness on feet: Secondary | ICD-10-CM | POA: Diagnosis not present

## 2017-06-09 DIAGNOSIS — M1000 Idiopathic gout, unspecified site: Secondary | ICD-10-CM | POA: Diagnosis not present

## 2017-06-09 DIAGNOSIS — I482 Chronic atrial fibrillation: Secondary | ICD-10-CM | POA: Diagnosis not present

## 2017-06-09 DIAGNOSIS — J9611 Chronic respiratory failure with hypoxia: Secondary | ICD-10-CM | POA: Diagnosis not present

## 2017-06-09 DIAGNOSIS — C8300 Small cell B-cell lymphoma, unspecified site: Secondary | ICD-10-CM | POA: Diagnosis not present

## 2017-06-09 DIAGNOSIS — I5032 Chronic diastolic (congestive) heart failure: Secondary | ICD-10-CM

## 2017-06-09 DIAGNOSIS — F39 Unspecified mood [affective] disorder: Secondary | ICD-10-CM | POA: Diagnosis not present

## 2017-06-10 DIAGNOSIS — Z741 Need for assistance with personal care: Secondary | ICD-10-CM | POA: Diagnosis not present

## 2017-06-10 DIAGNOSIS — R2681 Unsteadiness on feet: Secondary | ICD-10-CM | POA: Diagnosis not present

## 2017-06-13 DIAGNOSIS — Z741 Need for assistance with personal care: Secondary | ICD-10-CM | POA: Diagnosis not present

## 2017-06-13 DIAGNOSIS — R2681 Unsteadiness on feet: Secondary | ICD-10-CM | POA: Diagnosis not present

## 2017-06-15 DIAGNOSIS — Z741 Need for assistance with personal care: Secondary | ICD-10-CM | POA: Diagnosis not present

## 2017-06-15 DIAGNOSIS — R2681 Unsteadiness on feet: Secondary | ICD-10-CM | POA: Diagnosis not present

## 2017-06-15 NOTE — Progress Notes (Signed)
   03/09/17 1002  Acute Rehab PT Goals  Patient Stated Goal to go home  PT Goal Formulation With patient  Time For Goal Achievement 03/23/17  Potential to Achieve Goals Good  PT Time Calculation  PT Start Time (ACUTE ONLY) 0918  PT Stop Time (ACUTE ONLY) 0942  PT Time Calculation (min) (ACUTE ONLY) 24 min  PT G-Codes **NOT FOR INPATIENT CLASS**  Functional Assessment Tool Used AM-PAC 6 Clicks Basic Mobility  Functional Limitation Mobility: Walking and moving around  Mobility: Walking and Moving Around Current Status (Z1245) CK  Mobility: Walking and Moving Around Goal Status (Y0998) CI  PT General Charges  $$ ACUTE PT VISIT 1 Procedure  PT Evaluation  $PT Eval Low Complexity 1 Procedure   Late entry G-codes entered after review of initial documentation.  Leitha Bleak, PT 06/15/17, 10:23 AM [phone]

## 2017-06-16 DIAGNOSIS — E7801 Familial hypercholesterolemia: Secondary | ICD-10-CM | POA: Diagnosis not present

## 2017-06-16 DIAGNOSIS — I481 Persistent atrial fibrillation: Secondary | ICD-10-CM | POA: Diagnosis not present

## 2017-06-16 DIAGNOSIS — I10 Essential (primary) hypertension: Secondary | ICD-10-CM | POA: Diagnosis not present

## 2017-06-16 DIAGNOSIS — Z741 Need for assistance with personal care: Secondary | ICD-10-CM | POA: Diagnosis not present

## 2017-06-16 DIAGNOSIS — R2681 Unsteadiness on feet: Secondary | ICD-10-CM | POA: Diagnosis not present

## 2017-06-16 DIAGNOSIS — R001 Bradycardia, unspecified: Secondary | ICD-10-CM | POA: Diagnosis not present

## 2017-06-30 DIAGNOSIS — R2681 Unsteadiness on feet: Secondary | ICD-10-CM | POA: Diagnosis not present

## 2017-06-30 DIAGNOSIS — Z741 Need for assistance with personal care: Secondary | ICD-10-CM | POA: Diagnosis not present

## 2017-07-01 DIAGNOSIS — R2681 Unsteadiness on feet: Secondary | ICD-10-CM | POA: Diagnosis not present

## 2017-07-01 DIAGNOSIS — Z741 Need for assistance with personal care: Secondary | ICD-10-CM | POA: Diagnosis not present

## 2017-07-04 DIAGNOSIS — R2681 Unsteadiness on feet: Secondary | ICD-10-CM | POA: Diagnosis not present

## 2017-07-04 DIAGNOSIS — Z741 Need for assistance with personal care: Secondary | ICD-10-CM | POA: Diagnosis not present

## 2017-07-06 DIAGNOSIS — Z741 Need for assistance with personal care: Secondary | ICD-10-CM | POA: Diagnosis not present

## 2017-07-06 DIAGNOSIS — R2681 Unsteadiness on feet: Secondary | ICD-10-CM | POA: Diagnosis not present

## 2017-07-07 DIAGNOSIS — I10 Essential (primary) hypertension: Secondary | ICD-10-CM | POA: Diagnosis not present

## 2017-07-07 DIAGNOSIS — I481 Persistent atrial fibrillation: Secondary | ICD-10-CM | POA: Diagnosis not present

## 2017-07-07 DIAGNOSIS — R001 Bradycardia, unspecified: Secondary | ICD-10-CM | POA: Diagnosis not present

## 2017-07-07 DIAGNOSIS — E7801 Familial hypercholesterolemia: Secondary | ICD-10-CM | POA: Diagnosis not present

## 2017-07-08 DIAGNOSIS — Z741 Need for assistance with personal care: Secondary | ICD-10-CM | POA: Diagnosis not present

## 2017-07-08 DIAGNOSIS — R2681 Unsteadiness on feet: Secondary | ICD-10-CM | POA: Diagnosis not present

## 2017-07-13 DIAGNOSIS — K219 Gastro-esophageal reflux disease without esophagitis: Secondary | ICD-10-CM | POA: Diagnosis not present

## 2017-07-13 DIAGNOSIS — C9110 Chronic lymphocytic leukemia of B-cell type not having achieved remission: Secondary | ICD-10-CM | POA: Diagnosis not present

## 2017-07-13 DIAGNOSIS — I4891 Unspecified atrial fibrillation: Secondary | ICD-10-CM | POA: Diagnosis not present

## 2017-07-13 DIAGNOSIS — I5030 Unspecified diastolic (congestive) heart failure: Secondary | ICD-10-CM | POA: Diagnosis not present

## 2017-07-13 DIAGNOSIS — B351 Tinea unguium: Secondary | ICD-10-CM | POA: Diagnosis not present

## 2017-08-19 DIAGNOSIS — C9110 Chronic lymphocytic leukemia of B-cell type not having achieved remission: Secondary | ICD-10-CM | POA: Diagnosis not present

## 2017-08-19 DIAGNOSIS — K219 Gastro-esophageal reflux disease without esophagitis: Secondary | ICD-10-CM | POA: Diagnosis not present

## 2017-08-19 DIAGNOSIS — I5030 Unspecified diastolic (congestive) heart failure: Secondary | ICD-10-CM | POA: Diagnosis not present

## 2017-08-19 DIAGNOSIS — I4891 Unspecified atrial fibrillation: Secondary | ICD-10-CM | POA: Diagnosis not present

## 2017-08-19 DIAGNOSIS — F39 Unspecified mood [affective] disorder: Secondary | ICD-10-CM | POA: Diagnosis not present

## 2017-08-19 DIAGNOSIS — M1000 Idiopathic gout, unspecified site: Secondary | ICD-10-CM | POA: Diagnosis not present

## 2017-08-22 DIAGNOSIS — E039 Hypothyroidism, unspecified: Secondary | ICD-10-CM | POA: Diagnosis not present

## 2017-08-22 DIAGNOSIS — E890 Postprocedural hypothyroidism: Secondary | ICD-10-CM | POA: Diagnosis not present

## 2017-10-14 DIAGNOSIS — R001 Bradycardia, unspecified: Secondary | ICD-10-CM | POA: Diagnosis not present

## 2017-10-14 DIAGNOSIS — E782 Mixed hyperlipidemia: Secondary | ICD-10-CM | POA: Diagnosis not present

## 2017-10-14 DIAGNOSIS — I10 Essential (primary) hypertension: Secondary | ICD-10-CM | POA: Diagnosis not present

## 2017-10-14 DIAGNOSIS — I481 Persistent atrial fibrillation: Secondary | ICD-10-CM | POA: Diagnosis not present

## 2017-10-20 DIAGNOSIS — I5032 Chronic diastolic (congestive) heart failure: Secondary | ICD-10-CM | POA: Diagnosis not present

## 2017-10-20 DIAGNOSIS — F39 Unspecified mood [affective] disorder: Secondary | ICD-10-CM | POA: Diagnosis not present

## 2017-10-20 DIAGNOSIS — I480 Paroxysmal atrial fibrillation: Secondary | ICD-10-CM | POA: Diagnosis not present

## 2017-10-20 DIAGNOSIS — C9110 Chronic lymphocytic leukemia of B-cell type not having achieved remission: Secondary | ICD-10-CM | POA: Diagnosis not present

## 2017-11-21 DIAGNOSIS — R278 Other lack of coordination: Secondary | ICD-10-CM | POA: Diagnosis not present

## 2017-11-21 DIAGNOSIS — M6281 Muscle weakness (generalized): Secondary | ICD-10-CM | POA: Diagnosis not present

## 2017-11-21 DIAGNOSIS — R262 Difficulty in walking, not elsewhere classified: Secondary | ICD-10-CM | POA: Diagnosis not present

## 2017-11-23 DIAGNOSIS — R278 Other lack of coordination: Secondary | ICD-10-CM | POA: Diagnosis not present

## 2017-11-23 DIAGNOSIS — R262 Difficulty in walking, not elsewhere classified: Secondary | ICD-10-CM | POA: Diagnosis not present

## 2017-11-23 DIAGNOSIS — M6281 Muscle weakness (generalized): Secondary | ICD-10-CM | POA: Diagnosis not present

## 2017-11-24 DIAGNOSIS — M6281 Muscle weakness (generalized): Secondary | ICD-10-CM | POA: Diagnosis not present

## 2017-11-24 DIAGNOSIS — R278 Other lack of coordination: Secondary | ICD-10-CM | POA: Diagnosis not present

## 2017-11-24 DIAGNOSIS — R262 Difficulty in walking, not elsewhere classified: Secondary | ICD-10-CM | POA: Diagnosis not present

## 2017-11-25 DIAGNOSIS — R278 Other lack of coordination: Secondary | ICD-10-CM | POA: Diagnosis not present

## 2017-11-25 DIAGNOSIS — M6281 Muscle weakness (generalized): Secondary | ICD-10-CM | POA: Diagnosis not present

## 2017-11-25 DIAGNOSIS — R262 Difficulty in walking, not elsewhere classified: Secondary | ICD-10-CM | POA: Diagnosis not present

## 2017-11-28 DIAGNOSIS — R278 Other lack of coordination: Secondary | ICD-10-CM | POA: Diagnosis not present

## 2017-11-28 DIAGNOSIS — R262 Difficulty in walking, not elsewhere classified: Secondary | ICD-10-CM | POA: Diagnosis not present

## 2017-11-28 DIAGNOSIS — M6281 Muscle weakness (generalized): Secondary | ICD-10-CM | POA: Diagnosis not present

## 2017-11-29 DIAGNOSIS — R262 Difficulty in walking, not elsewhere classified: Secondary | ICD-10-CM | POA: Diagnosis not present

## 2017-11-29 DIAGNOSIS — M6281 Muscle weakness (generalized): Secondary | ICD-10-CM | POA: Diagnosis not present

## 2017-11-29 DIAGNOSIS — R278 Other lack of coordination: Secondary | ICD-10-CM | POA: Diagnosis not present

## 2017-11-30 DIAGNOSIS — R262 Difficulty in walking, not elsewhere classified: Secondary | ICD-10-CM | POA: Diagnosis not present

## 2017-11-30 DIAGNOSIS — M6281 Muscle weakness (generalized): Secondary | ICD-10-CM | POA: Diagnosis not present

## 2017-11-30 DIAGNOSIS — R278 Other lack of coordination: Secondary | ICD-10-CM | POA: Diagnosis not present

## 2017-12-01 DIAGNOSIS — R278 Other lack of coordination: Secondary | ICD-10-CM | POA: Diagnosis not present

## 2017-12-01 DIAGNOSIS — R262 Difficulty in walking, not elsewhere classified: Secondary | ICD-10-CM | POA: Diagnosis not present

## 2017-12-01 DIAGNOSIS — M6281 Muscle weakness (generalized): Secondary | ICD-10-CM | POA: Diagnosis not present

## 2017-12-02 DIAGNOSIS — R262 Difficulty in walking, not elsewhere classified: Secondary | ICD-10-CM | POA: Diagnosis not present

## 2017-12-02 DIAGNOSIS — M6281 Muscle weakness (generalized): Secondary | ICD-10-CM | POA: Diagnosis not present

## 2017-12-02 DIAGNOSIS — R278 Other lack of coordination: Secondary | ICD-10-CM | POA: Diagnosis not present

## 2017-12-03 DIAGNOSIS — R278 Other lack of coordination: Secondary | ICD-10-CM | POA: Diagnosis not present

## 2017-12-03 DIAGNOSIS — M6281 Muscle weakness (generalized): Secondary | ICD-10-CM | POA: Diagnosis not present

## 2017-12-03 DIAGNOSIS — R262 Difficulty in walking, not elsewhere classified: Secondary | ICD-10-CM | POA: Diagnosis not present

## 2017-12-06 DIAGNOSIS — R296 Repeated falls: Secondary | ICD-10-CM | POA: Diagnosis not present

## 2017-12-06 DIAGNOSIS — M6281 Muscle weakness (generalized): Secondary | ICD-10-CM | POA: Diagnosis not present

## 2017-12-06 DIAGNOSIS — R278 Other lack of coordination: Secondary | ICD-10-CM | POA: Diagnosis not present

## 2017-12-06 DIAGNOSIS — R262 Difficulty in walking, not elsewhere classified: Secondary | ICD-10-CM | POA: Diagnosis not present

## 2017-12-07 DIAGNOSIS — R278 Other lack of coordination: Secondary | ICD-10-CM | POA: Diagnosis not present

## 2017-12-07 DIAGNOSIS — M6281 Muscle weakness (generalized): Secondary | ICD-10-CM | POA: Diagnosis not present

## 2017-12-07 DIAGNOSIS — R262 Difficulty in walking, not elsewhere classified: Secondary | ICD-10-CM | POA: Diagnosis not present

## 2017-12-07 DIAGNOSIS — R296 Repeated falls: Secondary | ICD-10-CM | POA: Diagnosis not present

## 2017-12-09 DIAGNOSIS — R296 Repeated falls: Secondary | ICD-10-CM | POA: Diagnosis not present

## 2017-12-09 DIAGNOSIS — M6281 Muscle weakness (generalized): Secondary | ICD-10-CM | POA: Diagnosis not present

## 2017-12-09 DIAGNOSIS — R278 Other lack of coordination: Secondary | ICD-10-CM | POA: Diagnosis not present

## 2017-12-09 DIAGNOSIS — R262 Difficulty in walking, not elsewhere classified: Secondary | ICD-10-CM | POA: Diagnosis not present

## 2017-12-12 DIAGNOSIS — R296 Repeated falls: Secondary | ICD-10-CM | POA: Diagnosis not present

## 2017-12-12 DIAGNOSIS — R278 Other lack of coordination: Secondary | ICD-10-CM | POA: Diagnosis not present

## 2017-12-12 DIAGNOSIS — M6281 Muscle weakness (generalized): Secondary | ICD-10-CM | POA: Diagnosis not present

## 2017-12-12 DIAGNOSIS — R262 Difficulty in walking, not elsewhere classified: Secondary | ICD-10-CM | POA: Diagnosis not present

## 2017-12-13 DIAGNOSIS — M6281 Muscle weakness (generalized): Secondary | ICD-10-CM | POA: Diagnosis not present

## 2017-12-13 DIAGNOSIS — R262 Difficulty in walking, not elsewhere classified: Secondary | ICD-10-CM | POA: Diagnosis not present

## 2017-12-13 DIAGNOSIS — R278 Other lack of coordination: Secondary | ICD-10-CM | POA: Diagnosis not present

## 2017-12-13 DIAGNOSIS — R296 Repeated falls: Secondary | ICD-10-CM | POA: Diagnosis not present

## 2017-12-14 DIAGNOSIS — R278 Other lack of coordination: Secondary | ICD-10-CM | POA: Diagnosis not present

## 2017-12-14 DIAGNOSIS — R262 Difficulty in walking, not elsewhere classified: Secondary | ICD-10-CM | POA: Diagnosis not present

## 2017-12-14 DIAGNOSIS — R296 Repeated falls: Secondary | ICD-10-CM | POA: Diagnosis not present

## 2017-12-14 DIAGNOSIS — M6281 Muscle weakness (generalized): Secondary | ICD-10-CM | POA: Diagnosis not present

## 2017-12-15 DIAGNOSIS — R278 Other lack of coordination: Secondary | ICD-10-CM | POA: Diagnosis not present

## 2017-12-15 DIAGNOSIS — M6281 Muscle weakness (generalized): Secondary | ICD-10-CM | POA: Diagnosis not present

## 2017-12-15 DIAGNOSIS — R262 Difficulty in walking, not elsewhere classified: Secondary | ICD-10-CM | POA: Diagnosis not present

## 2017-12-15 DIAGNOSIS — R296 Repeated falls: Secondary | ICD-10-CM | POA: Diagnosis not present

## 2017-12-16 DIAGNOSIS — C9110 Chronic lymphocytic leukemia of B-cell type not having achieved remission: Secondary | ICD-10-CM | POA: Diagnosis not present

## 2017-12-16 DIAGNOSIS — I5030 Unspecified diastolic (congestive) heart failure: Secondary | ICD-10-CM | POA: Diagnosis not present

## 2017-12-16 DIAGNOSIS — K219 Gastro-esophageal reflux disease without esophagitis: Secondary | ICD-10-CM | POA: Diagnosis not present

## 2017-12-16 DIAGNOSIS — M6281 Muscle weakness (generalized): Secondary | ICD-10-CM | POA: Diagnosis not present

## 2017-12-16 DIAGNOSIS — I4891 Unspecified atrial fibrillation: Secondary | ICD-10-CM | POA: Diagnosis not present

## 2017-12-16 DIAGNOSIS — R296 Repeated falls: Secondary | ICD-10-CM | POA: Diagnosis not present

## 2017-12-16 DIAGNOSIS — M1000 Idiopathic gout, unspecified site: Secondary | ICD-10-CM | POA: Diagnosis not present

## 2017-12-16 DIAGNOSIS — R262 Difficulty in walking, not elsewhere classified: Secondary | ICD-10-CM | POA: Diagnosis not present

## 2017-12-16 DIAGNOSIS — F39 Unspecified mood [affective] disorder: Secondary | ICD-10-CM | POA: Diagnosis not present

## 2017-12-16 DIAGNOSIS — R278 Other lack of coordination: Secondary | ICD-10-CM | POA: Diagnosis not present

## 2017-12-19 DIAGNOSIS — M6281 Muscle weakness (generalized): Secondary | ICD-10-CM | POA: Diagnosis not present

## 2017-12-19 DIAGNOSIS — R296 Repeated falls: Secondary | ICD-10-CM | POA: Diagnosis not present

## 2017-12-19 DIAGNOSIS — R278 Other lack of coordination: Secondary | ICD-10-CM | POA: Diagnosis not present

## 2017-12-19 DIAGNOSIS — R262 Difficulty in walking, not elsewhere classified: Secondary | ICD-10-CM | POA: Diagnosis not present

## 2017-12-20 DIAGNOSIS — R262 Difficulty in walking, not elsewhere classified: Secondary | ICD-10-CM | POA: Diagnosis not present

## 2017-12-20 DIAGNOSIS — R278 Other lack of coordination: Secondary | ICD-10-CM | POA: Diagnosis not present

## 2017-12-20 DIAGNOSIS — R296 Repeated falls: Secondary | ICD-10-CM | POA: Diagnosis not present

## 2017-12-20 DIAGNOSIS — M6281 Muscle weakness (generalized): Secondary | ICD-10-CM | POA: Diagnosis not present

## 2017-12-21 DIAGNOSIS — R262 Difficulty in walking, not elsewhere classified: Secondary | ICD-10-CM | POA: Diagnosis not present

## 2017-12-21 DIAGNOSIS — R296 Repeated falls: Secondary | ICD-10-CM | POA: Diagnosis not present

## 2017-12-21 DIAGNOSIS — R278 Other lack of coordination: Secondary | ICD-10-CM | POA: Diagnosis not present

## 2017-12-21 DIAGNOSIS — M6281 Muscle weakness (generalized): Secondary | ICD-10-CM | POA: Diagnosis not present

## 2017-12-22 DIAGNOSIS — R296 Repeated falls: Secondary | ICD-10-CM | POA: Diagnosis not present

## 2017-12-22 DIAGNOSIS — R262 Difficulty in walking, not elsewhere classified: Secondary | ICD-10-CM | POA: Diagnosis not present

## 2017-12-22 DIAGNOSIS — R278 Other lack of coordination: Secondary | ICD-10-CM | POA: Diagnosis not present

## 2017-12-22 DIAGNOSIS — M6281 Muscle weakness (generalized): Secondary | ICD-10-CM | POA: Diagnosis not present

## 2017-12-23 DIAGNOSIS — M6281 Muscle weakness (generalized): Secondary | ICD-10-CM | POA: Diagnosis not present

## 2017-12-23 DIAGNOSIS — R262 Difficulty in walking, not elsewhere classified: Secondary | ICD-10-CM | POA: Diagnosis not present

## 2017-12-23 DIAGNOSIS — R278 Other lack of coordination: Secondary | ICD-10-CM | POA: Diagnosis not present

## 2017-12-23 DIAGNOSIS — R296 Repeated falls: Secondary | ICD-10-CM | POA: Diagnosis not present

## 2017-12-26 DIAGNOSIS — R296 Repeated falls: Secondary | ICD-10-CM | POA: Diagnosis not present

## 2017-12-26 DIAGNOSIS — M6281 Muscle weakness (generalized): Secondary | ICD-10-CM | POA: Diagnosis not present

## 2017-12-26 DIAGNOSIS — R278 Other lack of coordination: Secondary | ICD-10-CM | POA: Diagnosis not present

## 2017-12-26 DIAGNOSIS — R262 Difficulty in walking, not elsewhere classified: Secondary | ICD-10-CM | POA: Diagnosis not present

## 2017-12-27 DIAGNOSIS — R296 Repeated falls: Secondary | ICD-10-CM | POA: Diagnosis not present

## 2017-12-27 DIAGNOSIS — R262 Difficulty in walking, not elsewhere classified: Secondary | ICD-10-CM | POA: Diagnosis not present

## 2017-12-27 DIAGNOSIS — R278 Other lack of coordination: Secondary | ICD-10-CM | POA: Diagnosis not present

## 2017-12-27 DIAGNOSIS — M6281 Muscle weakness (generalized): Secondary | ICD-10-CM | POA: Diagnosis not present

## 2017-12-29 DIAGNOSIS — R262 Difficulty in walking, not elsewhere classified: Secondary | ICD-10-CM | POA: Diagnosis not present

## 2017-12-29 DIAGNOSIS — R278 Other lack of coordination: Secondary | ICD-10-CM | POA: Diagnosis not present

## 2017-12-29 DIAGNOSIS — R296 Repeated falls: Secondary | ICD-10-CM | POA: Diagnosis not present

## 2017-12-29 DIAGNOSIS — M6281 Muscle weakness (generalized): Secondary | ICD-10-CM | POA: Diagnosis not present

## 2018-01-23 DIAGNOSIS — I5032 Chronic diastolic (congestive) heart failure: Secondary | ICD-10-CM | POA: Diagnosis not present

## 2018-01-31 DIAGNOSIS — R05 Cough: Secondary | ICD-10-CM | POA: Diagnosis not present

## 2018-02-16 DIAGNOSIS — M109 Gout, unspecified: Secondary | ICD-10-CM | POA: Diagnosis not present

## 2018-02-16 DIAGNOSIS — I5032 Chronic diastolic (congestive) heart failure: Secondary | ICD-10-CM | POA: Diagnosis not present

## 2018-02-16 DIAGNOSIS — C9110 Chronic lymphocytic leukemia of B-cell type not having achieved remission: Secondary | ICD-10-CM | POA: Diagnosis not present

## 2018-02-16 DIAGNOSIS — I480 Paroxysmal atrial fibrillation: Secondary | ICD-10-CM | POA: Diagnosis not present

## 2018-02-16 DIAGNOSIS — F39 Unspecified mood [affective] disorder: Secondary | ICD-10-CM | POA: Diagnosis not present

## 2018-03-06 DIAGNOSIS — I10 Essential (primary) hypertension: Secondary | ICD-10-CM | POA: Diagnosis not present

## 2018-03-24 DIAGNOSIS — L02411 Cutaneous abscess of right axilla: Secondary | ICD-10-CM | POA: Diagnosis not present

## 2018-04-02 ENCOUNTER — Emergency Department
Admission: EM | Admit: 2018-04-02 | Discharge: 2018-04-02 | Payer: Medicare Other | Attending: Emergency Medicine | Admitting: Emergency Medicine

## 2018-04-02 ENCOUNTER — Encounter: Payer: Self-pay | Admitting: Emergency Medicine

## 2018-04-02 ENCOUNTER — Other Ambulatory Visit: Payer: Self-pay

## 2018-04-02 DIAGNOSIS — S0990XA Unspecified injury of head, initial encounter: Secondary | ICD-10-CM | POA: Diagnosis not present

## 2018-04-02 DIAGNOSIS — Z79899 Other long term (current) drug therapy: Secondary | ICD-10-CM | POA: Insufficient documentation

## 2018-04-02 DIAGNOSIS — I5032 Chronic diastolic (congestive) heart failure: Secondary | ICD-10-CM | POA: Insufficient documentation

## 2018-04-02 DIAGNOSIS — I110 Hypertensive heart disease with heart failure: Secondary | ICD-10-CM | POA: Diagnosis not present

## 2018-04-02 DIAGNOSIS — Z7982 Long term (current) use of aspirin: Secondary | ICD-10-CM | POA: Insufficient documentation

## 2018-04-02 DIAGNOSIS — S0003XA Contusion of scalp, initial encounter: Secondary | ICD-10-CM | POA: Insufficient documentation

## 2018-04-02 DIAGNOSIS — W19XXXA Unspecified fall, initial encounter: Secondary | ICD-10-CM | POA: Insufficient documentation

## 2018-04-02 DIAGNOSIS — Y92129 Unspecified place in nursing home as the place of occurrence of the external cause: Secondary | ICD-10-CM | POA: Diagnosis not present

## 2018-04-02 DIAGNOSIS — Y999 Unspecified external cause status: Secondary | ICD-10-CM | POA: Diagnosis not present

## 2018-04-02 DIAGNOSIS — S098XXA Other specified injuries of head, initial encounter: Secondary | ICD-10-CM | POA: Diagnosis not present

## 2018-04-02 DIAGNOSIS — Z87891 Personal history of nicotine dependence: Secondary | ICD-10-CM | POA: Diagnosis not present

## 2018-04-02 DIAGNOSIS — Y939 Activity, unspecified: Secondary | ICD-10-CM | POA: Insufficient documentation

## 2018-04-02 DIAGNOSIS — R6889 Other general symptoms and signs: Secondary | ICD-10-CM | POA: Diagnosis not present

## 2018-04-02 HISTORY — DX: Major depressive disorder, single episode, unspecified: F32.9

## 2018-04-02 HISTORY — DX: Pleural effusion, not elsewhere classified: J90

## 2018-04-02 HISTORY — DX: Cardiomegaly: I51.7

## 2018-04-02 HISTORY — DX: Edema, unspecified: R60.9

## 2018-04-02 HISTORY — DX: Transient cerebral ischemic attack, unspecified: G45.9

## 2018-04-02 HISTORY — DX: Anemia, unspecified: D64.9

## 2018-04-02 HISTORY — DX: Hypertensive heart disease with heart failure: I11.0

## 2018-04-02 HISTORY — DX: Acute and chronic respiratory failure with hypoxia: J96.21

## 2018-04-02 HISTORY — DX: Heart failure, unspecified: I50.9

## 2018-04-02 NOTE — ED Notes (Signed)
Pt triaged by this RN, see triage note

## 2018-04-02 NOTE — ED Notes (Addendum)
Pt's daughter and POA called to check on her mother; as allowed by pt, and after the 2 of them speaking, I spoke with daughter and informed her of findings and plan of care; verbalized understanding no xrays/ct's performed; pt will be going back to nursing home

## 2018-04-02 NOTE — ED Notes (Signed)
EMS leaving with pt

## 2018-04-02 NOTE — ED Triage Notes (Signed)
Pt arrived via EMS from [facility] following a fall; pt says she got up from her recliner to pick something up that had fallen off the wreath on her door; when she went back to sit in her recliner, she fell, landing on the floor and hitting the back of her head on her recliner; pt denies hip/pelvis/leg pain; only pain c/o is related to the abrasion on the back of her head; denies loss of consciousness; alert and oriented x 3, talking in complete coherent sentences

## 2018-04-02 NOTE — ED Provider Notes (Signed)
[facility] Emergency Department Provider Note   ____________________________________________   First MD Initiated Contact with Patient 04/02/18 2026     (approximate)  I have reviewed the triage vital signs and the nursing notes.   HISTORY  Chief Complaint Fall    HPI Victoria Contreras is a 84 y.o. female patient presents from nursing home via EMS secondary to a fall hit in the back of her head.  Patient denies LOC.  Patient denies headache.  Patient denies vision disturbance, vertigo, or focal weakness.  Patient state he had a sore impact site..  Past Medical History:  Diagnosis Date  . Acute and chronic respiratory failure with hypoxia ([place])   . Anemia   . Atrial fibrillation ([place])   . Cardiomegaly   . CHF (congestive heart failure) ([place])   . Chronic diastolic heart failure ([place])   . CLL (chronic lymphoid leukemia) in relapse ([place]) 04/17/2015  . CVA (cerebral infarction)   . Edema, unspecified   . Frequent nosebleeds   . Gout   . Hemangiopericytoma   . HLD (hyperlipidemia)   . HTN (hypertension)   . Hypertensive heart disease with heart failure ([place])   . Major depressive disorder   . Mood disorder ([place])   . Pleural effusion   . TIA (transient ischemic attack)     Patient Active Problem List   Diagnosis Date Noted  . Acute respiratory failure ([place]) 04/30/2017  . Anemia 03/08/2017  . Chronic diastolic heart failure ([place])   . Mood disorder ([place])   . Persistent atrial fibrillation ([place]) 08/31/2016  . Dysuria 11/06/2015  . Cystocele, grade 2 11/06/2015  . Atrophic vaginitis 11/06/2015  . CLL (chronic lymphocytic leukemia) ([place]) 04/17/2015  . 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 APPENDECTOMY    . BREAST LUMPECTOMY    . CATARACT EXTRACTION BILATERAL W/ ANTERIOR VITRECTOMY     right/left eye  . CHOLECYSTECTOMY    . DILATION AND CURETTAGE OF UTERUS    . hemangiopericytoma resection      . TONSILLECTOMY    . TOTAL KNEE ARTHROPLASTY     right knee    Prior to Admission medications   Medication Sig Start Date End Date Taking? Authorizing Provider  acetaminophen (TYLENOL) 650 MG CR tablet Take 650 mg by mouth 2 (two) times daily. Take 1 tablet at 0700 and 1 tablet at 2100    [provider]  allopurinol (ZYLOPRIM) 300 MG tablet Take 300 mg by mouth daily.    [provider]  alum & mag hydroxide-simeth (MAALOX/MYLANTA) 200-200-20 MG/5ML suspension Take 30 mLs by mouth every 4 (four) hours as needed for indigestion or heartburn.     [provider]  amiodarone (PACERONE) 200 MG tablet Take 200 mg by mouth daily.    [provider]  aspirin EC 81 MG EC tablet Take 1 tablet (81 mg total) by mouth daily. 05/10/17   Loletha Grayer, MD  bismuth subsalicylate (PEPTO BISMOL) 262 MG/15ML suspension Take 10 mLs by mouth 3 (three) times daily as needed.     [provider]  bisoprolol (ZEBETA) 5 MG tablet Take 1 tablet (5 mg total) by mouth daily. 05/10/17   Loletha Grayer, MD  carbamide peroxide (DEBROX) 6.5 % OTIC solution Place 5 drops into both ears as needed.    [provider]  citalopram (CELEXA) 20 MG tablet Take 20 mg by mouth daily.    [provider]  Dextromethorphan-Guaifenesin (  TUSSIN DM) 10-100 MG/5ML liquid Take 10 mLs by mouth every 4 (four) hours as needed.    [provider]  diltiazem (CARDIZEM CD) 300 MG 24 hr capsule Take 1 capsule (300 mg total) by mouth daily. 09/08/16   [name], MD  docusate sodium (COLACE) 100 MG capsule Take 1 capsule (100 mg total) by mouth 2 (two) times daily. 05/10/17   Loletha Grayer, MD  estradiol (ESTRACE) 0.1 MG/GM vaginal cream Place 1 Applicatorful vaginally every Monday, Wednesday, and Friday.    [provider]  ferrous sulfate 325 (65 FE) MG tablet Take 1 tablet (325 mg total) by mouth 2 (two) times daily with a meal. 05/10/17   Leslye Peer, Richard,  MD  furosemide (LASIX) 40 MG tablet Take 1 tablet (40 mg total) by mouth 2 (two) times daily. 05/10/17   Loletha Grayer, MD  hydrocortisone cream 0.5 % Apply 1 application topically 4 (four) times daily. Under nose; 4 times daily (0700,1130,1630,2100)    [provider]  magnesium hydroxide (MILK OF MAGNESIA) 400 MG/5ML suspension Take 30 mLs by mouth daily as needed for mild constipation.    [provider]  nystatin (NYSTATIN) powder Apply 1 Bottle topically 2 (two) times daily as needed.    [provider]  ondansetron (ZOFRAN) 4 MG tablet Take 4 mg by mouth 3 (three) times daily as needed for nausea or vomiting.    [provider]  pantoprazole (PROTONIX) 40 MG tablet Take 1 tablet (40 mg total) by mouth daily before breakfast. 03/09/17   [name], MD  potassium chloride (K-DUR) 10 MEQ tablet Take 2 tablets (20 mEq total) by mouth daily. Patient taking differently: Take 10 mEq by mouth daily.  10/30/16   [name], MD  sucralfate (CARAFATE) 1 g tablet Take 1 g by mouth 4 (four) times daily. Take 1 tab at 0700, 1 tab at 1130, 1 tab at 1630, 1 tab at 2100    [provider]    Allergies Codeine; Sulfa antibiotics; Tape; and Latex  Family History  Problem Relation Age of Onset  . Other Father        "brain hemorrhage"  . Melanoma Daughter 28  . Alcoholism Sister   . Alzheimer's disease Brother   . Alzheimer's disease Sister   . Cancer Sister   . Kidney disease Neg Hx   . Bladder Cancer Neg Hx     Social History Social History   Tobacco Use  . Smoking status: Former Smoker    Types: Cigarettes  . Smokeless tobacco: Never Used  . Tobacco comment: quit 1990  Substance Use Topics  . Alcohol use: No    Alcohol/week: 0.0 oz  . Drug use: No    Review of Systems  Constitutional: No fever/chills Eyes: No visual changes. ENT: No sore throat. Cardiovascular: Denies chest pain. Respiratory: Denies shortness of  breath. Gastrointestinal: No abdominal pain.  No nausea, no vomiting.  No diarrhea.  No constipation. Genitourinary: Negative for dysuria. Musculoskeletal: Negative for back pain. Skin: Negative for rash.  Scalp abrasion Neurological: Negative for headaches, focal weakness or numbness. Psychiatric:Major depression disorder. Endocrine: Gout, hyperlipidemia, and hypertension. Allergic/Immunilogical: See medication list. ____________________________________________   PHYSICAL EXAM:  VITAL SIGNS: ED Triage Vitals  Enc Vitals Group     BP 04/02/18 2020 (!) 156/62     Pulse Rate 04/02/18 2020 62     Resp 04/02/18 2020 19     Temp 04/02/18 2020 98.3 F (36.8 C)  Temp Source 04/02/18 2020 Oral     SpO2 04/02/18 2016 93 %     Weight 04/02/18 2026 180 lb (81.6 kg)     Height 04/02/18 2026 5\' 9"  (1.753 m)     Head Circumference --      Peak Flow --      Pain Score --      Pain Loc --      Pain Edu? --      Excl. in [place]? --    Constitutional: Alert and oriented. Well appearing and in no acute distress. Eyes: Conjunctivae are normal. PERRL. EOMI. Head: Atraumatic.  No hematoma or bleeding. Nose: No congestion/rhinnorhea. Mouth/Throat: Mucous membranes are moist.  Oropharynx non-erythematous. Neck: No stridor.  No cervical spine tenderness to palpation. Hematological/Lymphatic/Immunilogical: No cervical lymphadenopathy. Cardiovascular: Normal rate, regular rhythm. Grossly normal heart sounds.  Good peripheral circulation.  Elevated blood pressure Respiratory: Normal respiratory effort.  No retractions. Lungs CTAB. Gastrointestinal: Soft and nontender. No distention. No abdominal bruits. No CVA tenderness. Musculoskeletal: No lower extremity tenderness nor edema.  No joint effusions. Neurologic:  Normal speech and language. No gross focal neurologic deficits are appreciated. No gait instability. Skin:  Skin is warm, dry and intact. No rash noted. Psychiatric: Mood and affect are  normal. Speech and behavior are normal.  ____________________________________________   LABS (all labs ordered are listed, but only abnormal results are displayed)  Labs Reviewed - No data to display ____________________________________________  EKG   ____________________________________________  RADIOLOGY  ED MD interpretation:    Official radiology report(s): No results found.  ____________________________________________   PROCEDURES  Procedure(s) performed: None  Procedures  Critical Care performed: No  ____________________________________________   INITIAL IMPRESSION / ASSESSMENT AND PLAN / ED COURSE  As part of my medical decision making, I reviewed the following data within the [place] and an contusion to the occipital scalp prior to arrival.  Patient exam was grossly unremarkable.  Patient given discharge care instructions.  Patient advised follow-up PCP.  Patient advised return to ED if condition worsens.     ____________________________________________   FINAL CLINICAL IMPRESSION(S) / ED DIAGNOSES  Final diagnoses:  Contusion of scalp, initial encounter  Minor head injury, initial encounter     ED Discharge Orders    None       Note:  This document was prepared using Dragon voice recognition software and may include unintentional dictation errors.    Sable Feil, PA-C 04/02/18 2051    Hinda Kehr, MD 04/02/18 [phone]

## 2018-04-02 NOTE — Discharge Instructions (Addendum)
Continue previous medication may give Tylenol as needed for headache/pain.

## 2018-04-06 DIAGNOSIS — R278 Other lack of coordination: Secondary | ICD-10-CM | POA: Diagnosis not present

## 2018-04-06 DIAGNOSIS — R296 Repeated falls: Secondary | ICD-10-CM | POA: Diagnosis not present

## 2018-04-06 DIAGNOSIS — R2681 Unsteadiness on feet: Secondary | ICD-10-CM | POA: Diagnosis not present

## 2018-04-06 DIAGNOSIS — M6281 Muscle weakness (generalized): Secondary | ICD-10-CM | POA: Diagnosis not present

## 2018-04-07 DIAGNOSIS — R296 Repeated falls: Secondary | ICD-10-CM | POA: Diagnosis not present

## 2018-04-07 DIAGNOSIS — R2681 Unsteadiness on feet: Secondary | ICD-10-CM | POA: Diagnosis not present

## 2018-04-07 DIAGNOSIS — R278 Other lack of coordination: Secondary | ICD-10-CM | POA: Diagnosis not present

## 2018-04-07 DIAGNOSIS — M6281 Muscle weakness (generalized): Secondary | ICD-10-CM | POA: Diagnosis not present

## 2018-04-09 DIAGNOSIS — R278 Other lack of coordination: Secondary | ICD-10-CM | POA: Diagnosis not present

## 2018-04-09 DIAGNOSIS — R2681 Unsteadiness on feet: Secondary | ICD-10-CM | POA: Diagnosis not present

## 2018-04-09 DIAGNOSIS — R296 Repeated falls: Secondary | ICD-10-CM | POA: Diagnosis not present

## 2018-04-09 DIAGNOSIS — M6281 Muscle weakness (generalized): Secondary | ICD-10-CM | POA: Diagnosis not present

## 2018-04-10 DIAGNOSIS — R296 Repeated falls: Secondary | ICD-10-CM | POA: Diagnosis not present

## 2018-04-10 DIAGNOSIS — M6281 Muscle weakness (generalized): Secondary | ICD-10-CM | POA: Diagnosis not present

## 2018-04-10 DIAGNOSIS — R2681 Unsteadiness on feet: Secondary | ICD-10-CM | POA: Diagnosis not present

## 2018-04-10 DIAGNOSIS — R278 Other lack of coordination: Secondary | ICD-10-CM | POA: Diagnosis not present

## 2018-04-11 DIAGNOSIS — R296 Repeated falls: Secondary | ICD-10-CM | POA: Diagnosis not present

## 2018-04-11 DIAGNOSIS — R278 Other lack of coordination: Secondary | ICD-10-CM | POA: Diagnosis not present

## 2018-04-11 DIAGNOSIS — M6281 Muscle weakness (generalized): Secondary | ICD-10-CM | POA: Diagnosis not present

## 2018-04-11 DIAGNOSIS — R2681 Unsteadiness on feet: Secondary | ICD-10-CM | POA: Diagnosis not present

## 2018-04-12 DIAGNOSIS — R278 Other lack of coordination: Secondary | ICD-10-CM | POA: Diagnosis not present

## 2018-04-12 DIAGNOSIS — R2681 Unsteadiness on feet: Secondary | ICD-10-CM | POA: Diagnosis not present

## 2018-04-12 DIAGNOSIS — M6281 Muscle weakness (generalized): Secondary | ICD-10-CM | POA: Diagnosis not present

## 2018-04-12 DIAGNOSIS — R296 Repeated falls: Secondary | ICD-10-CM | POA: Diagnosis not present

## 2018-04-13 DIAGNOSIS — R296 Repeated falls: Secondary | ICD-10-CM | POA: Diagnosis not present

## 2018-04-13 DIAGNOSIS — M6281 Muscle weakness (generalized): Secondary | ICD-10-CM | POA: Diagnosis not present

## 2018-04-13 DIAGNOSIS — R2681 Unsteadiness on feet: Secondary | ICD-10-CM | POA: Diagnosis not present

## 2018-04-13 DIAGNOSIS — R278 Other lack of coordination: Secondary | ICD-10-CM | POA: Diagnosis not present

## 2018-04-17 DIAGNOSIS — R2681 Unsteadiness on feet: Secondary | ICD-10-CM | POA: Diagnosis not present

## 2018-04-17 DIAGNOSIS — R296 Repeated falls: Secondary | ICD-10-CM | POA: Diagnosis not present

## 2018-04-17 DIAGNOSIS — M6281 Muscle weakness (generalized): Secondary | ICD-10-CM | POA: Diagnosis not present

## 2018-04-17 DIAGNOSIS — R278 Other lack of coordination: Secondary | ICD-10-CM | POA: Diagnosis not present

## 2018-04-18 DIAGNOSIS — R2681 Unsteadiness on feet: Secondary | ICD-10-CM | POA: Diagnosis not present

## 2018-04-18 DIAGNOSIS — R278 Other lack of coordination: Secondary | ICD-10-CM | POA: Diagnosis not present

## 2018-04-18 DIAGNOSIS — M6281 Muscle weakness (generalized): Secondary | ICD-10-CM | POA: Diagnosis not present

## 2018-04-18 DIAGNOSIS — R296 Repeated falls: Secondary | ICD-10-CM | POA: Diagnosis not present

## 2018-04-19 DIAGNOSIS — R2681 Unsteadiness on feet: Secondary | ICD-10-CM | POA: Diagnosis not present

## 2018-04-19 DIAGNOSIS — R296 Repeated falls: Secondary | ICD-10-CM | POA: Diagnosis not present

## 2018-04-19 DIAGNOSIS — M6281 Muscle weakness (generalized): Secondary | ICD-10-CM | POA: Diagnosis not present

## 2018-04-19 DIAGNOSIS — R278 Other lack of coordination: Secondary | ICD-10-CM | POA: Diagnosis not present

## 2018-04-20 DIAGNOSIS — R296 Repeated falls: Secondary | ICD-10-CM | POA: Diagnosis not present

## 2018-04-20 DIAGNOSIS — M6281 Muscle weakness (generalized): Secondary | ICD-10-CM | POA: Diagnosis not present

## 2018-04-20 DIAGNOSIS — R2681 Unsteadiness on feet: Secondary | ICD-10-CM | POA: Diagnosis not present

## 2018-04-20 DIAGNOSIS — R278 Other lack of coordination: Secondary | ICD-10-CM | POA: Diagnosis not present

## 2018-04-21 DIAGNOSIS — I4891 Unspecified atrial fibrillation: Secondary | ICD-10-CM | POA: Diagnosis not present

## 2018-04-21 DIAGNOSIS — I5030 Unspecified diastolic (congestive) heart failure: Secondary | ICD-10-CM | POA: Diagnosis not present

## 2018-04-21 DIAGNOSIS — M6281 Muscle weakness (generalized): Secondary | ICD-10-CM | POA: Diagnosis not present

## 2018-04-21 DIAGNOSIS — K219 Gastro-esophageal reflux disease without esophagitis: Secondary | ICD-10-CM | POA: Diagnosis not present

## 2018-04-21 DIAGNOSIS — R296 Repeated falls: Secondary | ICD-10-CM | POA: Diagnosis not present

## 2018-04-21 DIAGNOSIS — F39 Unspecified mood [affective] disorder: Secondary | ICD-10-CM | POA: Diagnosis not present

## 2018-04-21 DIAGNOSIS — C9111 Chronic lymphocytic leukemia of B-cell type in remission: Secondary | ICD-10-CM | POA: Diagnosis not present

## 2018-04-21 DIAGNOSIS — R278 Other lack of coordination: Secondary | ICD-10-CM | POA: Diagnosis not present

## 2018-04-21 DIAGNOSIS — R2681 Unsteadiness on feet: Secondary | ICD-10-CM | POA: Diagnosis not present

## 2018-04-21 DIAGNOSIS — M1000 Idiopathic gout, unspecified site: Secondary | ICD-10-CM | POA: Diagnosis not present

## 2018-04-24 DIAGNOSIS — M6281 Muscle weakness (generalized): Secondary | ICD-10-CM | POA: Diagnosis not present

## 2018-04-24 DIAGNOSIS — R296 Repeated falls: Secondary | ICD-10-CM | POA: Diagnosis not present

## 2018-04-24 DIAGNOSIS — R2681 Unsteadiness on feet: Secondary | ICD-10-CM | POA: Diagnosis not present

## 2018-04-24 DIAGNOSIS — R278 Other lack of coordination: Secondary | ICD-10-CM | POA: Diagnosis not present

## 2018-04-26 DIAGNOSIS — M6281 Muscle weakness (generalized): Secondary | ICD-10-CM | POA: Diagnosis not present

## 2018-04-26 DIAGNOSIS — R2681 Unsteadiness on feet: Secondary | ICD-10-CM | POA: Diagnosis not present

## 2018-04-26 DIAGNOSIS — R296 Repeated falls: Secondary | ICD-10-CM | POA: Diagnosis not present

## 2018-04-26 DIAGNOSIS — R278 Other lack of coordination: Secondary | ICD-10-CM | POA: Diagnosis not present

## 2018-05-01 DIAGNOSIS — I10 Essential (primary) hypertension: Secondary | ICD-10-CM | POA: Diagnosis not present

## 2018-05-01 DIAGNOSIS — I481 Persistent atrial fibrillation: Secondary | ICD-10-CM | POA: Diagnosis not present

## 2018-05-01 DIAGNOSIS — E782 Mixed hyperlipidemia: Secondary | ICD-10-CM | POA: Diagnosis not present

## 2018-05-14 DIAGNOSIS — R7611 Nonspecific reaction to tuberculin skin test without active tuberculosis: Secondary | ICD-10-CM | POA: Diagnosis not present

## 2018-06-06 DIAGNOSIS — I480 Paroxysmal atrial fibrillation: Secondary | ICD-10-CM | POA: Diagnosis not present

## 2018-06-06 DIAGNOSIS — K219 Gastro-esophageal reflux disease without esophagitis: Secondary | ICD-10-CM | POA: Diagnosis not present

## 2018-06-06 DIAGNOSIS — C9110 Chronic lymphocytic leukemia of B-cell type not having achieved remission: Secondary | ICD-10-CM | POA: Diagnosis not present

## 2018-06-06 DIAGNOSIS — F39 Unspecified mood [affective] disorder: Secondary | ICD-10-CM | POA: Diagnosis not present

## 2018-06-06 DIAGNOSIS — I5032 Chronic diastolic (congestive) heart failure: Secondary | ICD-10-CM | POA: Diagnosis not present

## 2018-07-09 IMAGING — DX DG CHEST 1V PORT
1 series · 1 of 1 positions shown · non-contrast
Comparison: CT chest 09/02/2016. Single-view of the chest
08/31/2016.

CLINICAL DATA: Weakness today.

EXAM:
PORTABLE CHEST 1 VIEW

[chest ap]
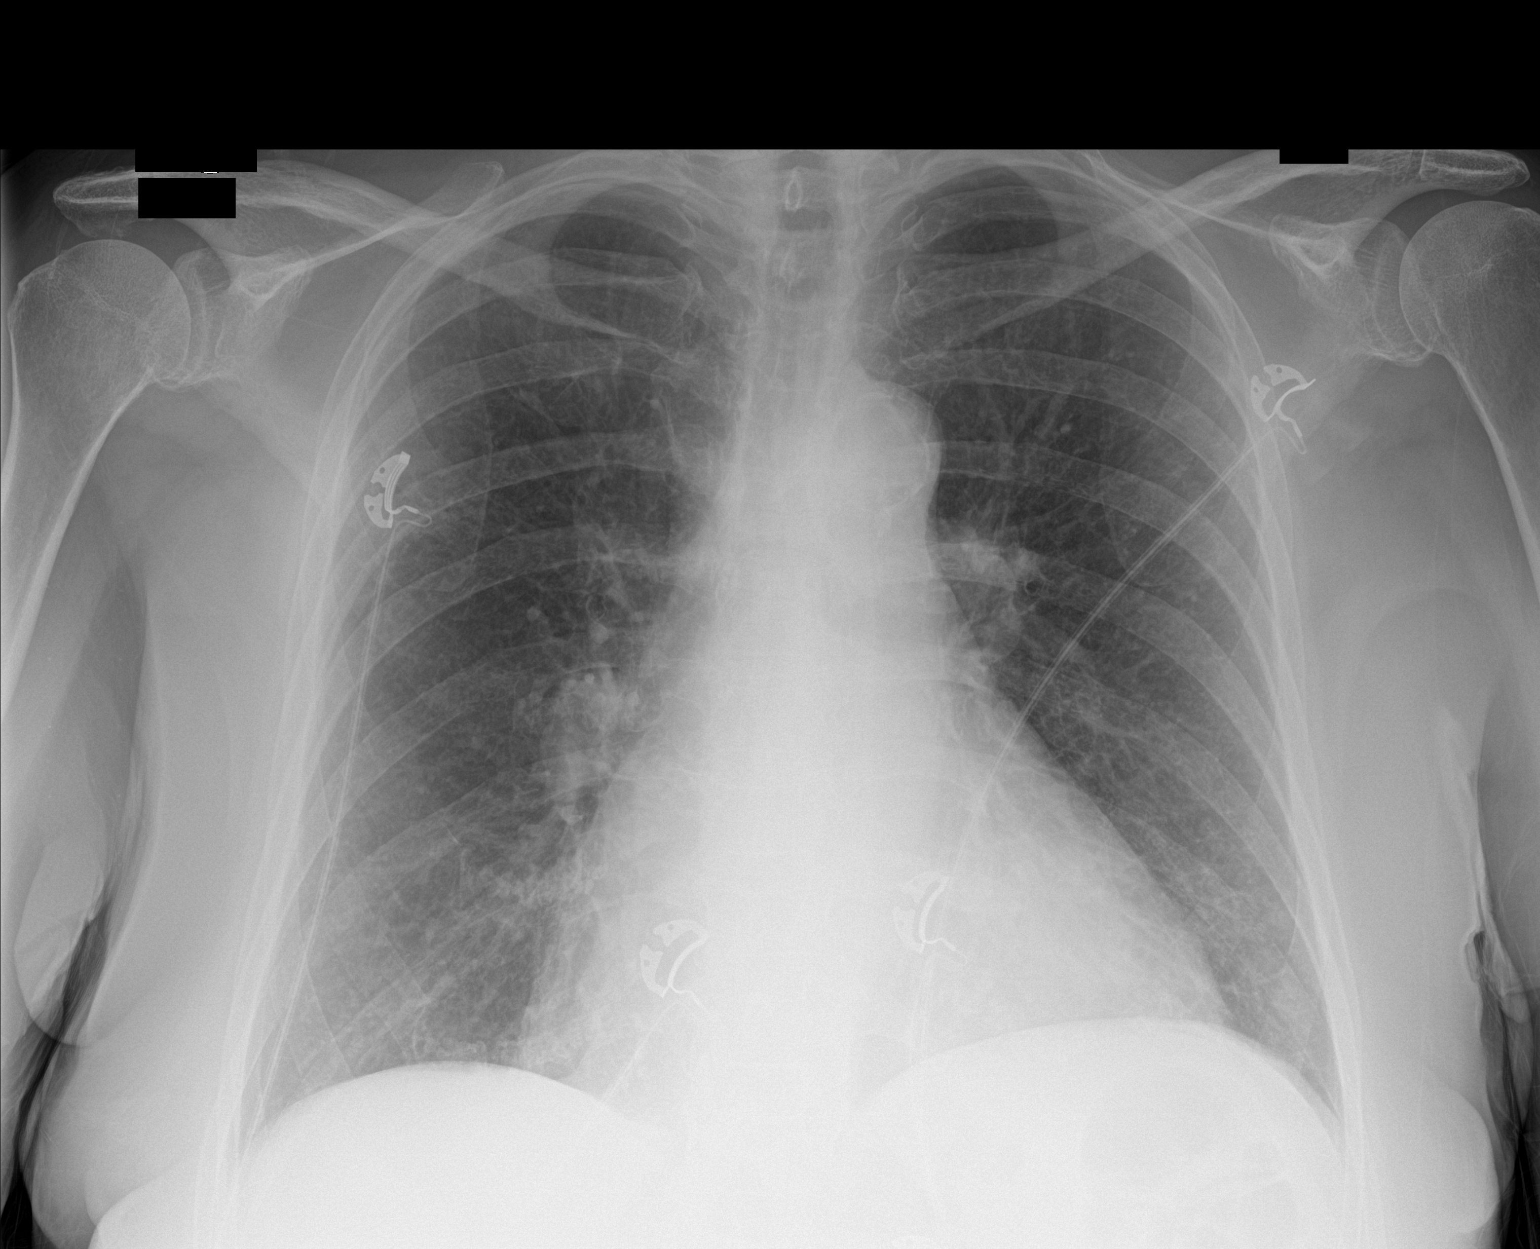

[1 of 1 positions shown; findings below may reference images not displayed]

FINDINGS: There is cardiomegaly without edema. No pneumothorax or pleural
effusion. Aortic atherosclerosis noted. No acute bony abnormality.
IMPRESSION: Cardiomegaly without acute disease.

Atherosclerosis.

## 2018-07-10 IMAGING — CT CT HEAD W/O CM
3 series · 15 of 47 positions shown, 18 images · non-contrast
Comparison: None.

CLINICAL DATA: Generalize weakness. History of a hemangiopericytoma
and chronic lymphocytic leukemia.

EXAM:
CT HEAD WITHOUT CONTRAST
TECHNIQUE: Contiguous axial images were obtained from the base of the skull
through the vertex without intravenous contrast.

[Series 2: head wo · axial · 0.47mm/px · z∈[-114,+11]mm · 9 of 31 slices shown, 12 images]
[im 3/31  brain]
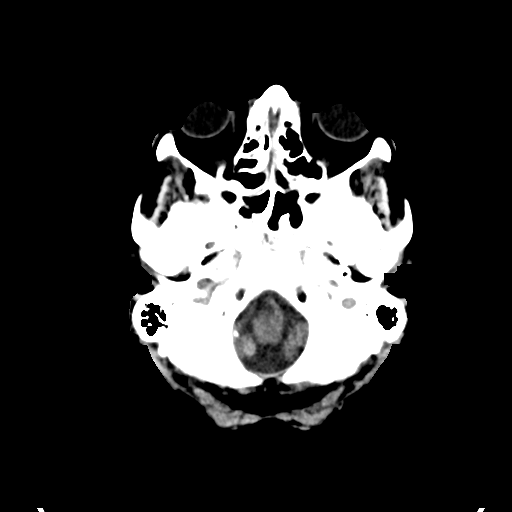
[im 3/31  bone]
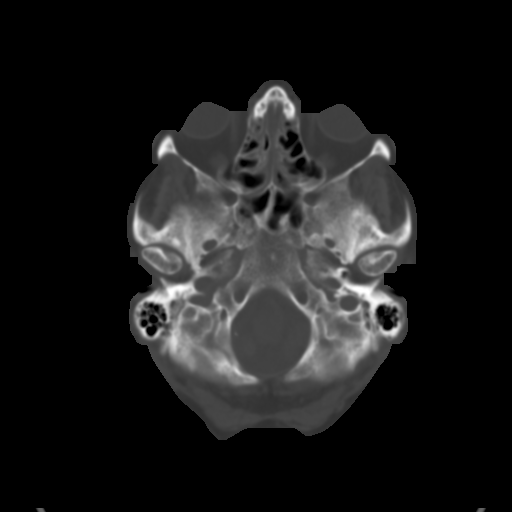
[im 6/31  brain]
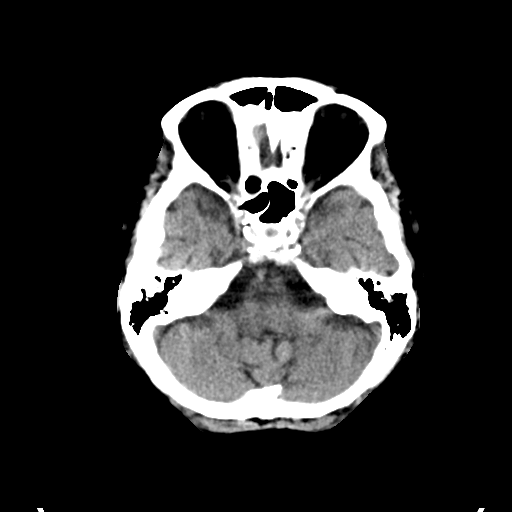
[im 9/31  brain]
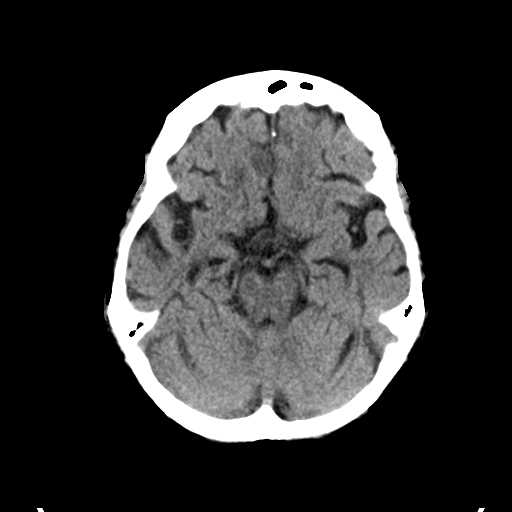
[im 12/31  brain]
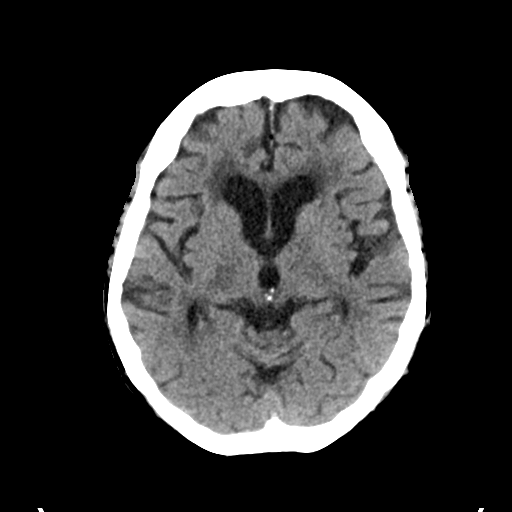
[im 16/31  brain]
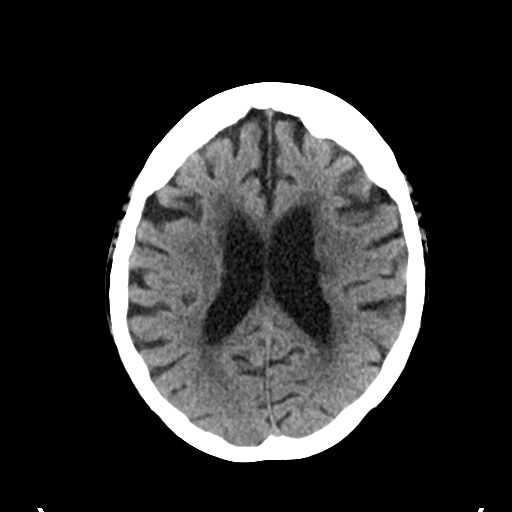
[im 16/31  bone]
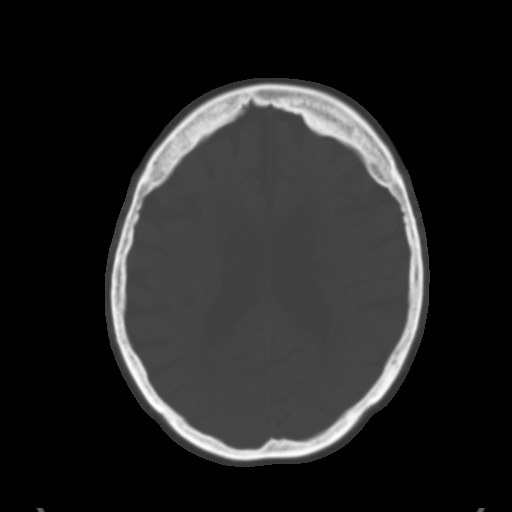
[im 19/31  brain]
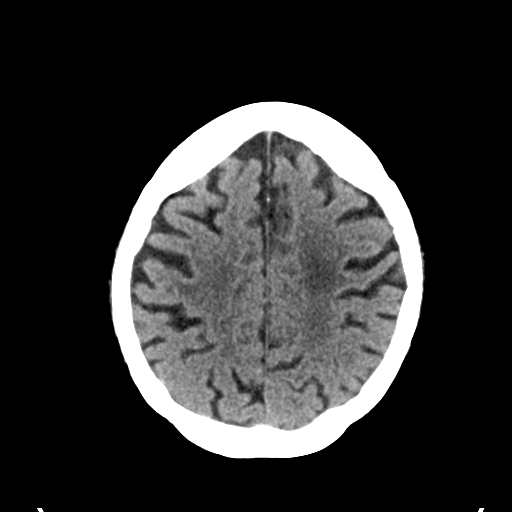
[im 22/31  brain]
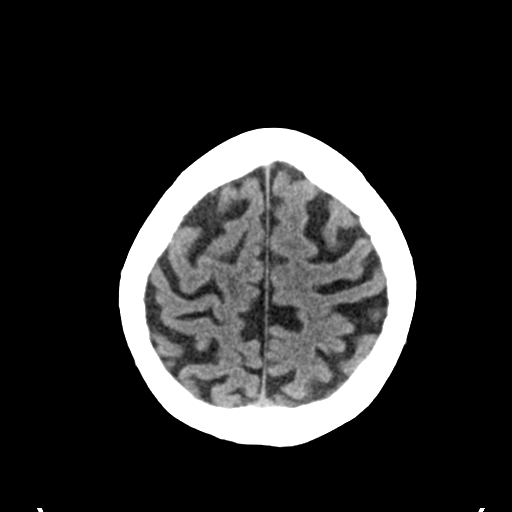
[im 25/31  brain]
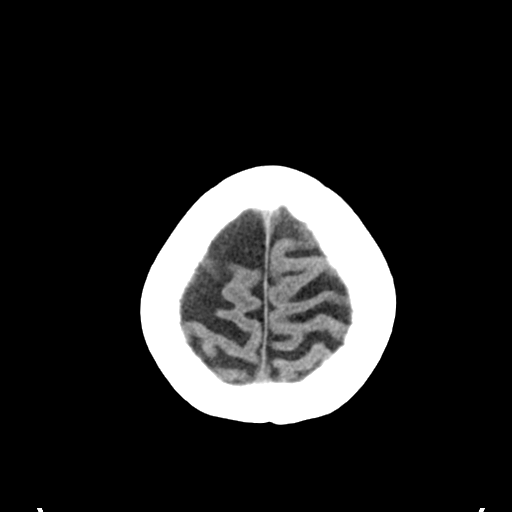
[im 28/31  brain]
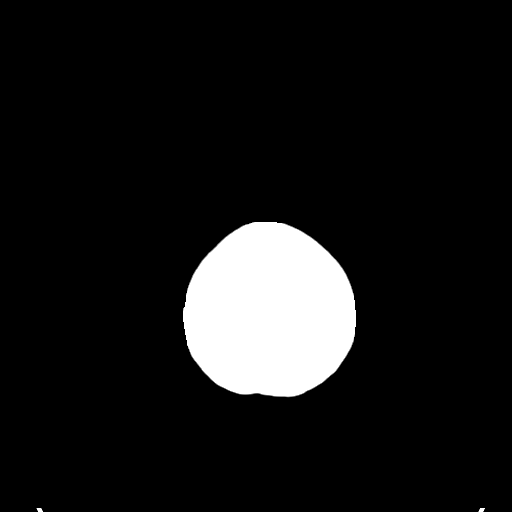
[im 28/31  bone]
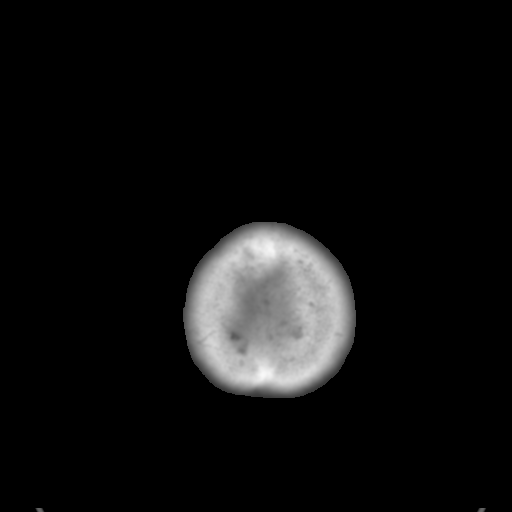

[Series 4: coronal soft tissue · coronal · 0.29mm/px · 3 of 67 slices shown]
[im 23/67  brain]
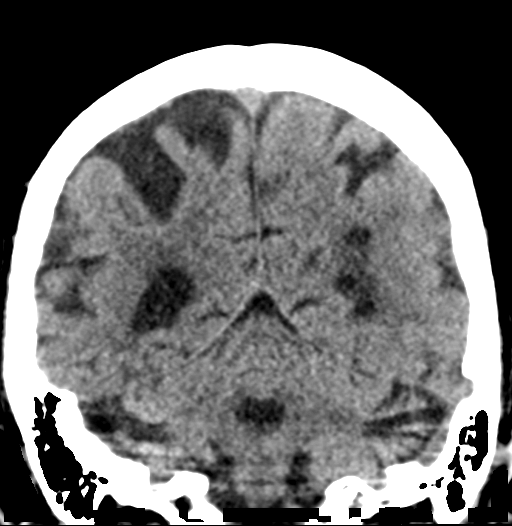
[im 30/67  brain]
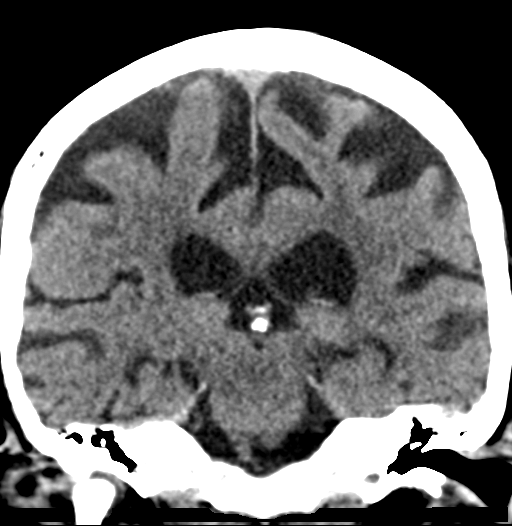
[im 37/67  brain]
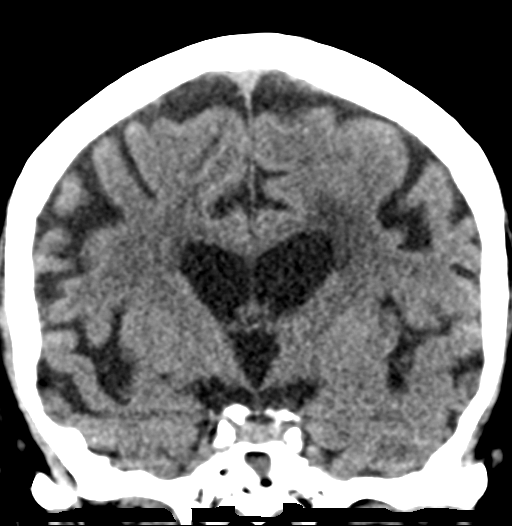

[Series 5: sagittal soft tissue · sagittal · 0.29mm/px · 3 of 55 slices shown]
[im 19/55  brain]
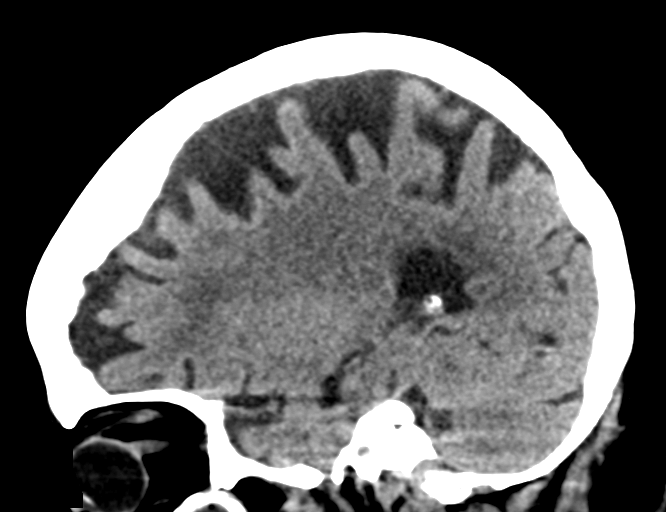
[im 28/55  brain]
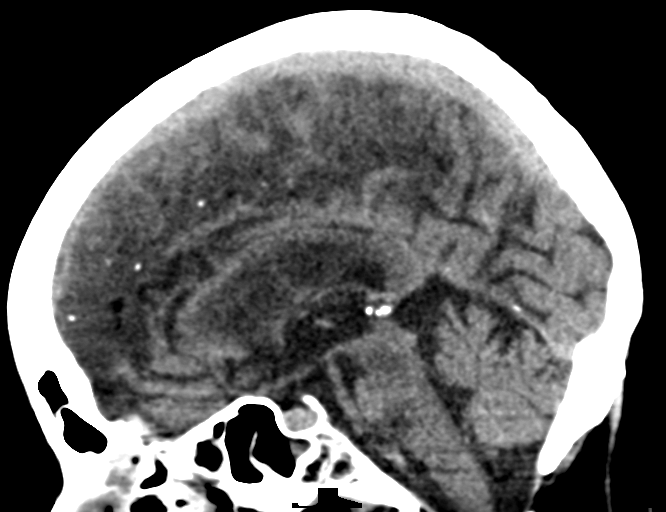
[im 37/55  brain]
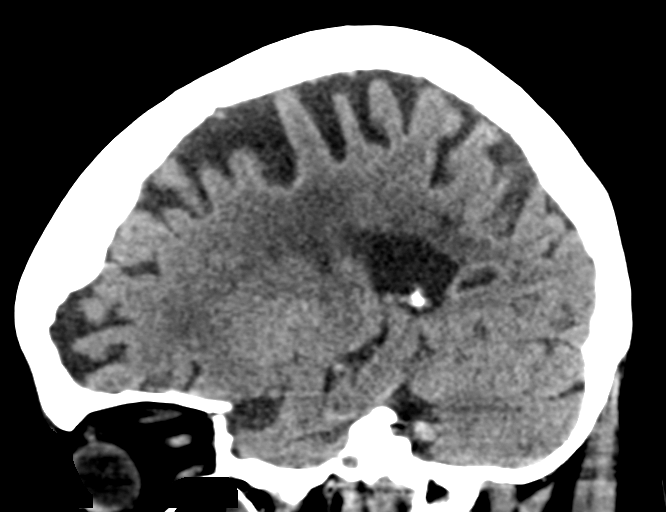

[15 of 47 positions shown; findings below may reference images not displayed]

FINDINGS: Brain: No evidence of acute infarction, hemorrhage, hydrocephalus,
extra-axial collection or mass lesion/mass effect.

There is ventricular and sulcal enlargement reflecting moderate
generalized atrophy. There is well-defined hypoattenuation in the
subcortical white matter of the left parietal lobe consistent with
an old white matter lacune infarct. There is evidence of a small
deep white matter lacune infarct adjacent to the body of the left
lateral ventricle. White matter hypoattenuation is noted consistent
with mild chronic microvascular ischemic change.

Vascular: No hyperdense vessel or unexpected calcification.

Skull: Normal. Negative for fracture or focal lesion.

Sinuses/Orbits: There is significant sinus disease. Moderate mucosal
thickening lines the ethmoid air cells with some dependent fluid and
mucosal thickening in the inferior frontal sinuses. There is mucosal
thickening and dependent fluid in the maxillary sinuses and sphenoid
sinuses. Mastoid air cells are clear.

Visualize globes and orbits are unremarkable.

Other: None.
IMPRESSION: 1. No acute intracranial abnormalities.
2. Atrophy, chronic microvascular ischemic change in 2 small old
white matter lacune infarcts.
3. Significant sinus disease including maxillary and sphenoid sinus
air-fluid levels. Findings are consistent with acute sinusitis in
the proper clinical setting.

## 2018-07-11 DIAGNOSIS — L989 Disorder of the skin and subcutaneous tissue, unspecified: Secondary | ICD-10-CM | POA: Diagnosis not present

## 2018-07-12 DIAGNOSIS — Z85828 Personal history of other malignant neoplasm of skin: Secondary | ICD-10-CM | POA: Diagnosis not present

## 2018-07-12 DIAGNOSIS — D485 Neoplasm of uncertain behavior of skin: Secondary | ICD-10-CM | POA: Diagnosis not present

## 2018-07-12 DIAGNOSIS — L578 Other skin changes due to chronic exposure to nonionizing radiation: Secondary | ICD-10-CM | POA: Diagnosis not present

## 2018-07-12 DIAGNOSIS — C4401 Basal cell carcinoma of skin of lip: Secondary | ICD-10-CM | POA: Diagnosis not present

## 2018-07-31 ENCOUNTER — Other Ambulatory Visit: Payer: Self-pay

## 2018-07-31 ENCOUNTER — Ambulatory Visit
Admission: RE | Admit: 2018-07-31 | Discharge: 2018-07-31 | Disposition: A | Discharge status: Home / Self Care | Payer: Medicare Other | Source: Ambulatory Visit | Attending: Radiation Oncology | Admitting: Radiation Oncology

## 2018-07-31 ENCOUNTER — Encounter: Payer: Self-pay | Admitting: Radiation Oncology

## 2018-07-31 DIAGNOSIS — E785 Hyperlipidemia, unspecified: Secondary | ICD-10-CM | POA: Diagnosis not present

## 2018-07-31 DIAGNOSIS — Z7982 Long term (current) use of aspirin: Secondary | ICD-10-CM | POA: Insufficient documentation

## 2018-07-31 DIAGNOSIS — R609 Edema, unspecified: Secondary | ICD-10-CM | POA: Diagnosis not present

## 2018-07-31 DIAGNOSIS — C4401 Basal cell carcinoma of skin of lip: Secondary | ICD-10-CM

## 2018-07-31 DIAGNOSIS — J90 Pleural effusion, not elsewhere classified: Secondary | ICD-10-CM | POA: Diagnosis not present

## 2018-07-31 DIAGNOSIS — D481 Neoplasm of uncertain behavior of connective and other soft tissue: Secondary | ICD-10-CM | POA: Diagnosis not present

## 2018-07-31 DIAGNOSIS — R040 Epistaxis: Secondary | ICD-10-CM | POA: Insufficient documentation

## 2018-07-31 DIAGNOSIS — I110 Hypertensive heart disease with heart failure: Secondary | ICD-10-CM | POA: Insufficient documentation

## 2018-07-31 DIAGNOSIS — I5032 Chronic diastolic (congestive) heart failure: Secondary | ICD-10-CM | POA: Diagnosis not present

## 2018-07-31 DIAGNOSIS — Z87891 Personal history of nicotine dependence: Secondary | ICD-10-CM | POA: Diagnosis not present

## 2018-07-31 DIAGNOSIS — C9192 Lymphoid leukemia, unspecified, in relapse: Secondary | ICD-10-CM | POA: Diagnosis not present

## 2018-07-31 DIAGNOSIS — Z808 Family history of malignant neoplasm of other organs or systems: Secondary | ICD-10-CM | POA: Diagnosis not present

## 2018-07-31 DIAGNOSIS — I4891 Unspecified atrial fibrillation: Secondary | ICD-10-CM | POA: Diagnosis not present

## 2018-07-31 DIAGNOSIS — F329 Major depressive disorder, single episode, unspecified: Secondary | ICD-10-CM | POA: Insufficient documentation

## 2018-07-31 DIAGNOSIS — D649 Anemia, unspecified: Secondary | ICD-10-CM | POA: Insufficient documentation

## 2018-07-31 DIAGNOSIS — Z79899 Other long term (current) drug therapy: Secondary | ICD-10-CM | POA: Insufficient documentation

## 2018-07-31 DIAGNOSIS — Z8673 Personal history of transient ischemic attack (TIA), and cerebral infarction without residual deficits: Secondary | ICD-10-CM | POA: Diagnosis not present

## 2018-07-31 DIAGNOSIS — Z809 Family history of malignant neoplasm, unspecified: Secondary | ICD-10-CM | POA: Insufficient documentation

## 2018-07-31 DIAGNOSIS — M109 Gout, unspecified: Secondary | ICD-10-CM | POA: Diagnosis not present

## 2018-07-31 DIAGNOSIS — J9621 Acute and chronic respiratory failure with hypoxia: Secondary | ICD-10-CM | POA: Diagnosis not present

## 2018-07-31 NOTE — Consult Note (Signed)
NEW PATIENT EVALUATION  Name: Victoria Contreras  MRN: [id number]  Date:   07/31/2018     DOB: 04-23-1925   This 84 y.o. female patient presents to the clinic for initial evaluation of infiltrative basal cell carcinoma of the mid upper lip.  REFERRING PHYSICIAN: [name], MD  CHIEF COMPLAINT:  Chief Complaint  Patient presents with  . Skin Cancer    DIAGNOSIS: The encounter diagnosis was Basal cell carcinoma (BCC) of skin of lip.   PREVIOUS INVESTIGATIONS:  Pathology reports reviewed Clinical notes reviewed  HPI: patient is a 84 year old female resident of [address] who's noticed a lesion on her upper lip for some time recent biopsy by Dr. Wyline Copas skin revealed infiltrative basal cell carcinoma. She is seen today for consideration of radiation therapy with curative intent. She states lesion does cross the every once a while she uses petroleum jelly on it to prevent that. She is having no pain on mastication.  PLANNED TREATMENT REGIMEN: electron beam therapy  PAST MEDICAL HISTORY:  has a past medical history of Acute and chronic respiratory failure with hypoxia ([place]), Anemia, Atrial fibrillation (H[facility]), Cardiomegaly, CHF (congestive heart failure) (HCC), Chronic diastolic heart failure ([place]), CLL (chronic lymphoid leukemia) in relapse ([place]) (04/17/2015), CVA (cerebral infarction), Edema, unspecified, Frequent nosebleeds, Gout, Hemangiopericytoma, HLD (hyperlipidemia), HTN (hypertension), Hypertensive heart disease with heart failure ([place]), Major depressive disorder, Mood disorder ([place]), Pleural effusion, and TIA (transient ischemic attack).    PAST SURGICAL HISTORY:  Past Surgical History:  Procedure Laterality Date  . APPENDECTOMY    . BREAST LUMPECTOMY    . CATARACT EXTRACTION BILATERAL W/ ANTERIOR VITRECTOMY     right/left eye  . CHOLECYSTECTOMY    . DILATION AND CURETTAGE OF UTERUS    . hemangiopericytoma resection    . TONSILLECTOMY    . TOTAL KNEE ARTHROPLASTY     right  knee    FAMILY HISTORY: family history includes Alcoholism in her sister; Alzheimer's disease in her brother and sister; Cancer in her sister; Melanoma (age of onset: 11) in her daughter; Other in her father.  SOCIAL HISTORY:  reports that she has quit smoking. Her smoking use included cigarettes. She has never used smokeless tobacco. She reports that she does not drink alcohol or use drugs.  ALLERGIES: Codeine; Sulfa antibiotics; Tape; and Latex  MEDICATIONS:  Current Outpatient Medications  Medication Sig Dispense Refill  . acetaminophen (TYLENOL) 650 MG CR tablet Take 650 mg by mouth 2 (two) times daily. Take 1 tablet at 0700 and 1 tablet at 2100    . allopurinol (ZYLOPRIM) 300 MG tablet Take 300 mg by mouth daily.    Marland Kitchen alum & mag hydroxide-simeth (MAALOX/MYLANTA) 200-200-20 MG/5ML suspension Take 30 mLs by mouth every 4 (four) hours as needed for indigestion or heartburn.     Marland Kitchen aluminum hydroxide [facility]) ointment Apply topically 3 (three) times daily as needed.    Marland Kitchen amiodarone (PACERONE) 200 MG tablet Take 100 mg by mouth daily.     Marland Kitchen aspirin EC 81 MG EC tablet Take 1 tablet (81 mg total) by mouth daily. 30 tablet 0  . citalopram (CELEXA) 20 MG tablet Take 20 mg by mouth at bedtime.     Marland Kitchen Dextromethorphan-Guaifenesin (TUSSIN DM) 10-100 MG/5ML liquid Take 10 mLs by mouth every 4 (four) hours as needed.    . furosemide (LASIX) 40 MG tablet Take 1 tablet (40 mg total) by mouth 2 (two) times daily. (Patient taking differently: Take 40 mg by mouth daily. If  weight is between 165-180lbs *and give another dose at 2pm if weight is >185 lbs) 60 tablet 0  . magnesium hydroxide (MILK OF MAGNESIA) 400 MG/5ML suspension Take 30 mLs by mouth daily as needed for mild constipation.    Marland Kitchen omeprazole (PRILOSEC) 20 MG capsule Take 20 mg by mouth daily.    . ondansetron (ZOFRAN) 4 MG tablet Take 4 mg by mouth 3 (three) times daily as needed for nausea or vomiting.    . potassium chloride (K-DUR) 10 MEQ  tablet Take 2 tablets (20 mEq total) by mouth daily. (Patient taking differently: Take 20 mEq by mouth daily with lunch. ) 90 tablet 0  . pseudoephedrine-dextromethorphan-guaifenesin (ROBITUSSIN-PE) 30-10-100 MG/5ML solution Take 10 mLs by mouth every 4 (four) hours as needed for cough.     No current facility-administered medications for this encounter.     ECOG PERFORMANCE STATUS:  0 - Asymptomatic  REVIEW OF SYSTEMS:  Patient denies any weight loss, fatigue, weakness, fever, chills or night sweats. Patient denies any loss of vision, blurred vision. Patient denies any ringing  of the ears or hearing loss. No irregular heartbeat. Patient denies heart murmur or history of fainting. Patient denies any chest pain or pain radiating to her upper extremities. Patient denies any shortness of breath, difficulty breathing at night, cough or hemoptysis. Patient denies any swelling in the lower legs. Patient denies any nausea vomiting, vomiting of blood, or coffee ground material in the vomitus. Patient denies any stomach pain. Patient states has had normal bowel movements no significant constipation or diarrhea. Patient denies any dysuria, hematuria or significant nocturia. Patient denies any problems walking, swelling in the joints or loss of balance. Patient denies any skin changes, loss of hair or loss of weight. Patient denies any excessive worrying or anxiety or significant depression. Patient denies any problems with insomnia. Patient denies excessive thirst, polyuria, polydipsia. Patient denies any swollen glands, patient denies easy bruising or easy bleeding. Patient denies any recent infections, allergies or URI. Patient "s visual fields have not changed significantly in recent time.    PHYSICAL EXAM: BP (P) 115/66 (BP Location: Left Arm, Patient Position: Sitting)   Pulse (P) 61   Temp (P) 98 F (36.7 C) (Tympanic)  She has a slightly ulcerated lesion in the mid portion of her upper lip. Oral  cavity is clear no oral mucosal lesions are identified. Neck is clear without evidence of preauricular sub-digastric cervical or supraclavicular adenopathy.Well-developed well-nourished patient in NAD. HEENT reveals PERLA, EOMI, discs not visualized.  Oral cavity is clear. No oral mucosal lesions are identified. Neck is clear without evidence of cervical or supraclavicular adenopathy. Lungs are clear to A&P. Cardiac examination is essentially unremarkable with regular rate and rhythm without murmur rub or thrill. Abdomen is benign with no organomegaly or masses noted. Motor sensory and DTR levels are equal and symmetric in the upper and lower extremities. Cranial nerves II through XII are grossly intact. Proprioception is intact. No peripheral adenopathy or edema is identified. No motor or sensory levels are noted. Crude visual fields are within normal range.  LABORATORY DATA: pathology reports reviewed    RADIOLOGY RESULTS:no films for review   IMPRESSION: infiltrative basal cell carcinoma the midportion of the upper lip in 84 year old female  PLAN: December to go ahead with electron beam therapy to this area lesion. I would plan on delivering 5000 cGy in 25 fractions. Risks and benefits of treatment including skin reaction fatigue slight chance of oral mucositis were all discussed in  detail with the patient and her family. They all seem to comprehend my treatment plan well. I have personally set up and ordered CT simulation after the Thanksgiving holiday.  I would like to take this opportunity to thank you for allowing me to participate in the care of your patient.Noreene Filbert, MD

## 2018-08-07 ENCOUNTER — Ambulatory Visit
Admission: RE | Admit: 2018-08-07 | Discharge: 2018-08-07 | Disposition: A | Discharge status: Home / Self Care | Payer: Medicare Other | Source: Ambulatory Visit | Attending: Radiation Oncology | Admitting: Radiation Oncology

## 2018-08-07 ENCOUNTER — Ambulatory Visit: Payer: Medicare Other

## 2018-08-07 DIAGNOSIS — Z510 Encounter for antineoplastic radiation therapy: Secondary | ICD-10-CM | POA: Diagnosis not present

## 2018-08-07 DIAGNOSIS — C4401 Basal cell carcinoma of skin of lip: Secondary | ICD-10-CM | POA: Diagnosis not present

## 2018-08-08 ENCOUNTER — Ambulatory Visit: Payer: Medicare Other

## 2018-08-09 ENCOUNTER — Ambulatory Visit: Payer: Medicare Other

## 2018-08-10 ENCOUNTER — Ambulatory Visit: Payer: Medicare Other

## 2018-08-10 DIAGNOSIS — Z510 Encounter for antineoplastic radiation therapy: Secondary | ICD-10-CM | POA: Diagnosis not present

## 2018-08-10 DIAGNOSIS — C4401 Basal cell carcinoma of skin of lip: Secondary | ICD-10-CM | POA: Diagnosis not present

## 2018-08-11 ENCOUNTER — Ambulatory Visit: Payer: Medicare Other

## 2018-08-14 ENCOUNTER — Ambulatory Visit: Payer: Medicare Other

## 2018-08-14 ENCOUNTER — Ambulatory Visit
Admission: RE | Admit: 2018-08-14 | Discharge: 2018-08-14 | Disposition: A | Discharge status: Home / Self Care | Payer: Medicare Other | Source: Ambulatory Visit | Attending: Radiation Oncology | Admitting: Radiation Oncology

## 2018-08-14 DIAGNOSIS — Z510 Encounter for antineoplastic radiation therapy: Secondary | ICD-10-CM | POA: Diagnosis not present

## 2018-08-14 DIAGNOSIS — C4401 Basal cell carcinoma of skin of lip: Secondary | ICD-10-CM | POA: Diagnosis not present

## 2018-08-15 ENCOUNTER — Ambulatory Visit
Admission: RE | Admit: 2018-08-15 | Discharge: 2018-08-15 | Disposition: A | Discharge status: Home / Self Care | Payer: Medicare Other | Source: Ambulatory Visit | Attending: Radiation Oncology | Admitting: Radiation Oncology

## 2018-08-15 ENCOUNTER — Ambulatory Visit: Payer: Medicare Other

## 2018-08-15 DIAGNOSIS — Z510 Encounter for antineoplastic radiation therapy: Secondary | ICD-10-CM | POA: Diagnosis not present

## 2018-08-15 DIAGNOSIS — C4401 Basal cell carcinoma of skin of lip: Secondary | ICD-10-CM | POA: Diagnosis not present

## 2018-08-16 ENCOUNTER — Ambulatory Visit
Admission: RE | Admit: 2018-08-16 | Discharge: 2018-08-16 | Disposition: A | Discharge status: Home / Self Care | Payer: Medicare Other | Source: Ambulatory Visit | Attending: Radiation Oncology | Admitting: Radiation Oncology

## 2018-08-16 ENCOUNTER — Ambulatory Visit: Payer: Medicare Other

## 2018-08-16 DIAGNOSIS — C4401 Basal cell carcinoma of skin of lip: Secondary | ICD-10-CM | POA: Diagnosis not present

## 2018-08-16 DIAGNOSIS — Z510 Encounter for antineoplastic radiation therapy: Secondary | ICD-10-CM | POA: Diagnosis not present

## 2018-08-17 ENCOUNTER — Ambulatory Visit: Payer: Medicare Other

## 2018-08-17 ENCOUNTER — Ambulatory Visit
Admission: RE | Admit: 2018-08-17 | Discharge: 2018-08-17 | Disposition: A | Discharge status: Home / Self Care | Payer: Medicare Other | Source: Ambulatory Visit | Attending: Radiation Oncology | Admitting: Radiation Oncology

## 2018-08-17 DIAGNOSIS — C4401 Basal cell carcinoma of skin of lip: Secondary | ICD-10-CM | POA: Diagnosis not present

## 2018-08-17 DIAGNOSIS — Z510 Encounter for antineoplastic radiation therapy: Secondary | ICD-10-CM | POA: Diagnosis not present

## 2018-08-18 ENCOUNTER — Ambulatory Visit: Payer: Medicare Other

## 2018-08-18 ENCOUNTER — Ambulatory Visit
Admission: RE | Admit: 2018-08-18 | Discharge: 2018-08-18 | Disposition: A | Discharge status: Home / Self Care | Payer: Medicare Other | Source: Ambulatory Visit | Attending: Radiation Oncology | Admitting: Radiation Oncology

## 2018-08-18 DIAGNOSIS — I4891 Unspecified atrial fibrillation: Secondary | ICD-10-CM | POA: Diagnosis not present

## 2018-08-18 DIAGNOSIS — I5030 Unspecified diastolic (congestive) heart failure: Secondary | ICD-10-CM | POA: Diagnosis not present

## 2018-08-18 DIAGNOSIS — F39 Unspecified mood [affective] disorder: Secondary | ICD-10-CM | POA: Diagnosis not present

## 2018-08-18 DIAGNOSIS — C9110 Chronic lymphocytic leukemia of B-cell type not having achieved remission: Secondary | ICD-10-CM | POA: Diagnosis not present

## 2018-08-18 DIAGNOSIS — K219 Gastro-esophageal reflux disease without esophagitis: Secondary | ICD-10-CM | POA: Diagnosis not present

## 2018-08-18 DIAGNOSIS — C4401 Basal cell carcinoma of skin of lip: Secondary | ICD-10-CM | POA: Diagnosis not present

## 2018-08-18 DIAGNOSIS — C4490 Unspecified malignant neoplasm of skin, unspecified: Secondary | ICD-10-CM | POA: Diagnosis not present

## 2018-08-18 DIAGNOSIS — M1000 Idiopathic gout, unspecified site: Secondary | ICD-10-CM | POA: Diagnosis not present

## 2018-08-18 DIAGNOSIS — Z510 Encounter for antineoplastic radiation therapy: Secondary | ICD-10-CM | POA: Diagnosis not present

## 2018-08-21 ENCOUNTER — Ambulatory Visit: Payer: Medicare Other

## 2018-08-21 ENCOUNTER — Ambulatory Visit
Admission: RE | Admit: 2018-08-21 | Discharge: 2018-08-21 | Disposition: A | Discharge status: Home / Self Care | Payer: Medicare Other | Source: Ambulatory Visit | Attending: Radiation Oncology | Admitting: Radiation Oncology

## 2018-08-21 DIAGNOSIS — Z510 Encounter for antineoplastic radiation therapy: Secondary | ICD-10-CM | POA: Diagnosis not present

## 2018-08-21 DIAGNOSIS — C4401 Basal cell carcinoma of skin of lip: Secondary | ICD-10-CM | POA: Diagnosis not present

## 2018-08-22 ENCOUNTER — Ambulatory Visit: Payer: Medicare Other

## 2018-08-22 ENCOUNTER — Ambulatory Visit
Admission: RE | Admit: 2018-08-22 | Discharge: 2018-08-22 | Disposition: A | Discharge status: Home / Self Care | Payer: Medicare Other | Source: Ambulatory Visit | Attending: Radiation Oncology | Admitting: Radiation Oncology

## 2018-08-22 DIAGNOSIS — C4401 Basal cell carcinoma of skin of lip: Secondary | ICD-10-CM | POA: Diagnosis not present

## 2018-08-22 DIAGNOSIS — Z510 Encounter for antineoplastic radiation therapy: Secondary | ICD-10-CM | POA: Diagnosis not present

## 2018-08-23 ENCOUNTER — Ambulatory Visit: Payer: Medicare Other

## 2018-08-23 ENCOUNTER — Ambulatory Visit
Admission: RE | Admit: 2018-08-23 | Discharge: 2018-08-23 | Disposition: A | Discharge status: Home / Self Care | Payer: Medicare Other | Source: Ambulatory Visit | Attending: Radiation Oncology | Admitting: Radiation Oncology

## 2018-08-23 DIAGNOSIS — C4401 Basal cell carcinoma of skin of lip: Secondary | ICD-10-CM | POA: Diagnosis not present

## 2018-08-23 DIAGNOSIS — Z510 Encounter for antineoplastic radiation therapy: Secondary | ICD-10-CM | POA: Diagnosis not present

## 2018-08-24 ENCOUNTER — Ambulatory Visit
Admission: RE | Admit: 2018-08-24 | Discharge: 2018-08-24 | Disposition: A | Discharge status: Home / Self Care | Payer: Medicare Other | Source: Ambulatory Visit | Attending: Radiation Oncology | Admitting: Radiation Oncology

## 2018-08-24 ENCOUNTER — Ambulatory Visit: Payer: Medicare Other

## 2018-08-24 DIAGNOSIS — Z510 Encounter for antineoplastic radiation therapy: Secondary | ICD-10-CM | POA: Diagnosis not present

## 2018-08-24 DIAGNOSIS — C4401 Basal cell carcinoma of skin of lip: Secondary | ICD-10-CM | POA: Diagnosis not present

## 2018-08-25 ENCOUNTER — Ambulatory Visit
Admission: RE | Admit: 2018-08-25 | Discharge: 2018-08-25 | Disposition: A | Discharge status: Home / Self Care | Payer: Medicare Other | Source: Ambulatory Visit | Attending: Radiation Oncology | Admitting: Radiation Oncology

## 2018-08-25 ENCOUNTER — Ambulatory Visit: Payer: Medicare Other

## 2018-08-25 DIAGNOSIS — C4401 Basal cell carcinoma of skin of lip: Secondary | ICD-10-CM | POA: Diagnosis not present

## 2018-08-25 DIAGNOSIS — Z510 Encounter for antineoplastic radiation therapy: Secondary | ICD-10-CM | POA: Diagnosis not present

## 2018-08-28 ENCOUNTER — Ambulatory Visit: Payer: Medicare Other

## 2018-08-28 ENCOUNTER — Ambulatory Visit
Admission: RE | Admit: 2018-08-28 | Discharge: 2018-08-28 | Disposition: A | Discharge status: Home / Self Care | Payer: Medicare Other | Source: Ambulatory Visit | Attending: Radiation Oncology | Admitting: Radiation Oncology

## 2018-08-28 DIAGNOSIS — Z510 Encounter for antineoplastic radiation therapy: Secondary | ICD-10-CM | POA: Diagnosis not present

## 2018-08-28 DIAGNOSIS — C4401 Basal cell carcinoma of skin of lip: Secondary | ICD-10-CM | POA: Diagnosis not present

## 2018-08-29 ENCOUNTER — Ambulatory Visit
Admission: RE | Admit: 2018-08-29 | Discharge: 2018-08-29 | Disposition: A | Discharge status: Home / Self Care | Payer: Medicare Other | Source: Ambulatory Visit | Attending: Radiation Oncology | Admitting: Radiation Oncology

## 2018-08-29 ENCOUNTER — Ambulatory Visit: Payer: Medicare Other

## 2018-08-29 DIAGNOSIS — Z510 Encounter for antineoplastic radiation therapy: Secondary | ICD-10-CM | POA: Diagnosis not present

## 2018-08-29 DIAGNOSIS — C4401 Basal cell carcinoma of skin of lip: Secondary | ICD-10-CM | POA: Diagnosis not present

## 2018-08-31 ENCOUNTER — Ambulatory Visit
Admission: RE | Admit: 2018-08-31 | Discharge: 2018-08-31 | Disposition: A | Discharge status: Home / Self Care | Payer: Medicare Other | Source: Ambulatory Visit | Attending: Radiation Oncology | Admitting: Radiation Oncology

## 2018-08-31 ENCOUNTER — Ambulatory Visit: Payer: Medicare Other

## 2018-08-31 DIAGNOSIS — Z510 Encounter for antineoplastic radiation therapy: Secondary | ICD-10-CM | POA: Diagnosis not present

## 2018-08-31 DIAGNOSIS — C4401 Basal cell carcinoma of skin of lip: Secondary | ICD-10-CM | POA: Diagnosis not present

## 2018-09-01 ENCOUNTER — Ambulatory Visit: Payer: Medicare Other

## 2018-09-01 ENCOUNTER — Ambulatory Visit
Admission: RE | Admit: 2018-09-01 | Discharge: 2018-09-01 | Disposition: A | Discharge status: Home / Self Care | Payer: Medicare Other | Source: Ambulatory Visit | Attending: Radiation Oncology | Admitting: Radiation Oncology

## 2018-09-01 DIAGNOSIS — C4401 Basal cell carcinoma of skin of lip: Secondary | ICD-10-CM | POA: Diagnosis not present

## 2018-09-01 DIAGNOSIS — Z510 Encounter for antineoplastic radiation therapy: Secondary | ICD-10-CM | POA: Diagnosis not present

## 2018-09-04 ENCOUNTER — Ambulatory Visit
Admission: RE | Admit: 2018-09-04 | Discharge: 2018-09-04 | Disposition: A | Discharge status: Home / Self Care | Payer: Medicare Other | Source: Ambulatory Visit | Attending: Radiation Oncology | Admitting: Radiation Oncology

## 2018-09-04 ENCOUNTER — Ambulatory Visit: Payer: Medicare Other

## 2018-09-04 DIAGNOSIS — C4401 Basal cell carcinoma of skin of lip: Secondary | ICD-10-CM | POA: Diagnosis not present

## 2018-09-04 DIAGNOSIS — Z510 Encounter for antineoplastic radiation therapy: Secondary | ICD-10-CM | POA: Diagnosis not present

## 2018-09-05 ENCOUNTER — Ambulatory Visit
Admission: RE | Admit: 2018-09-05 | Discharge: 2018-09-05 | Disposition: A | Discharge status: Home / Self Care | Payer: Medicare Other | Source: Ambulatory Visit | Attending: Radiation Oncology | Admitting: Radiation Oncology

## 2018-09-05 ENCOUNTER — Ambulatory Visit: Payer: Medicare Other

## 2018-09-05 DIAGNOSIS — Z510 Encounter for antineoplastic radiation therapy: Secondary | ICD-10-CM | POA: Diagnosis not present

## 2018-09-05 DIAGNOSIS — C4401 Basal cell carcinoma of skin of lip: Secondary | ICD-10-CM | POA: Diagnosis not present

## 2018-09-07 ENCOUNTER — Ambulatory Visit: Payer: Medicare Other

## 2018-09-07 ENCOUNTER — Ambulatory Visit
Admission: RE | Admit: 2018-09-07 | Discharge: 2018-09-07 | Disposition: A | Discharge status: Home / Self Care | Payer: Medicare Other | Source: Ambulatory Visit | Attending: Radiation Oncology | Admitting: Radiation Oncology

## 2018-09-07 DIAGNOSIS — C4401 Basal cell carcinoma of skin of lip: Secondary | ICD-10-CM | POA: Diagnosis not present

## 2018-09-07 DIAGNOSIS — Z510 Encounter for antineoplastic radiation therapy: Secondary | ICD-10-CM | POA: Insufficient documentation

## 2018-09-08 ENCOUNTER — Ambulatory Visit: Payer: Medicare Other

## 2018-09-08 ENCOUNTER — Ambulatory Visit
Admission: RE | Admit: 2018-09-08 | Discharge: 2018-09-08 | Disposition: A | Discharge status: Home / Self Care | Payer: Medicare Other | Source: Ambulatory Visit | Attending: Radiation Oncology | Admitting: Radiation Oncology

## 2018-09-08 DIAGNOSIS — Z510 Encounter for antineoplastic radiation therapy: Secondary | ICD-10-CM | POA: Diagnosis not present

## 2018-09-08 DIAGNOSIS — C4401 Basal cell carcinoma of skin of lip: Secondary | ICD-10-CM | POA: Diagnosis not present

## 2018-09-11 ENCOUNTER — Ambulatory Visit: Payer: Medicare Other

## 2018-09-11 ENCOUNTER — Ambulatory Visit
Admission: RE | Admit: 2018-09-11 | Discharge: 2018-09-11 | Disposition: A | Discharge status: Home / Self Care | Payer: Medicare Other | Source: Ambulatory Visit | Attending: Radiation Oncology | Admitting: Radiation Oncology

## 2018-09-11 DIAGNOSIS — C4401 Basal cell carcinoma of skin of lip: Secondary | ICD-10-CM | POA: Diagnosis not present

## 2018-09-11 DIAGNOSIS — Z510 Encounter for antineoplastic radiation therapy: Secondary | ICD-10-CM | POA: Diagnosis not present

## 2018-09-12 ENCOUNTER — Ambulatory Visit
Admission: RE | Admit: 2018-09-12 | Discharge: 2018-09-12 | Disposition: A | Discharge status: Home / Self Care | Payer: Medicare Other | Source: Ambulatory Visit | Attending: Radiation Oncology | Admitting: Radiation Oncology

## 2018-09-12 ENCOUNTER — Ambulatory Visit: Payer: Medicare Other

## 2018-09-12 DIAGNOSIS — Z510 Encounter for antineoplastic radiation therapy: Secondary | ICD-10-CM | POA: Diagnosis not present

## 2018-09-12 DIAGNOSIS — C4401 Basal cell carcinoma of skin of lip: Secondary | ICD-10-CM | POA: Diagnosis not present

## 2018-09-13 ENCOUNTER — Ambulatory Visit
Admission: RE | Admit: 2018-09-13 | Discharge: 2018-09-13 | Disposition: A | Discharge status: Home / Self Care | Payer: Medicare Other | Source: Ambulatory Visit | Attending: Radiation Oncology | Admitting: Radiation Oncology

## 2018-09-13 DIAGNOSIS — C4401 Basal cell carcinoma of skin of lip: Secondary | ICD-10-CM | POA: Diagnosis not present

## 2018-09-13 DIAGNOSIS — Z510 Encounter for antineoplastic radiation therapy: Secondary | ICD-10-CM | POA: Diagnosis not present

## 2018-09-14 ENCOUNTER — Ambulatory Visit
Admission: RE | Admit: 2018-09-14 | Discharge: 2018-09-14 | Disposition: A | Discharge status: Home / Self Care | Payer: Medicare Other | Source: Ambulatory Visit | Attending: Radiation Oncology | Admitting: Radiation Oncology

## 2018-09-14 DIAGNOSIS — C4401 Basal cell carcinoma of skin of lip: Secondary | ICD-10-CM | POA: Diagnosis not present

## 2018-09-14 DIAGNOSIS — I5030 Unspecified diastolic (congestive) heart failure: Secondary | ICD-10-CM | POA: Diagnosis not present

## 2018-09-14 DIAGNOSIS — Z510 Encounter for antineoplastic radiation therapy: Secondary | ICD-10-CM | POA: Diagnosis not present

## 2018-09-14 DIAGNOSIS — Z741 Need for assistance with personal care: Secondary | ICD-10-CM | POA: Diagnosis not present

## 2018-09-14 DIAGNOSIS — R278 Other lack of coordination: Secondary | ICD-10-CM | POA: Diagnosis not present

## 2018-09-14 DIAGNOSIS — R4189 Other symptoms and signs involving cognitive functions and awareness: Secondary | ICD-10-CM | POA: Diagnosis not present

## 2018-09-14 DIAGNOSIS — R2689 Other abnormalities of gait and mobility: Secondary | ICD-10-CM | POA: Diagnosis not present

## 2018-09-14 DIAGNOSIS — J9621 Acute and chronic respiratory failure with hypoxia: Secondary | ICD-10-CM | POA: Diagnosis not present

## 2018-09-14 DIAGNOSIS — F329 Major depressive disorder, single episode, unspecified: Secondary | ICD-10-CM | POA: Diagnosis not present

## 2018-09-14 DIAGNOSIS — M6281 Muscle weakness (generalized): Secondary | ICD-10-CM | POA: Diagnosis not present

## 2018-09-15 ENCOUNTER — Ambulatory Visit
Admission: RE | Admit: 2018-09-15 | Discharge: 2018-09-15 | Disposition: A | Discharge status: Home / Self Care | Payer: Medicare Other | Source: Ambulatory Visit | Attending: Radiation Oncology | Admitting: Radiation Oncology

## 2018-09-15 DIAGNOSIS — M6281 Muscle weakness (generalized): Secondary | ICD-10-CM | POA: Diagnosis not present

## 2018-09-15 DIAGNOSIS — I5030 Unspecified diastolic (congestive) heart failure: Secondary | ICD-10-CM | POA: Diagnosis not present

## 2018-09-15 DIAGNOSIS — R278 Other lack of coordination: Secondary | ICD-10-CM | POA: Diagnosis not present

## 2018-09-15 DIAGNOSIS — C4401 Basal cell carcinoma of skin of lip: Secondary | ICD-10-CM | POA: Diagnosis not present

## 2018-09-15 DIAGNOSIS — Z510 Encounter for antineoplastic radiation therapy: Secondary | ICD-10-CM | POA: Diagnosis not present

## 2018-09-15 DIAGNOSIS — F329 Major depressive disorder, single episode, unspecified: Secondary | ICD-10-CM | POA: Diagnosis not present

## 2018-09-15 DIAGNOSIS — J9621 Acute and chronic respiratory failure with hypoxia: Secondary | ICD-10-CM | POA: Diagnosis not present

## 2018-09-15 DIAGNOSIS — R2689 Other abnormalities of gait and mobility: Secondary | ICD-10-CM | POA: Diagnosis not present

## 2018-09-18 ENCOUNTER — Ambulatory Visit
Admission: RE | Admit: 2018-09-18 | Discharge: 2018-09-18 | Disposition: A | Discharge status: Home / Self Care | Payer: Medicare Other | Source: Ambulatory Visit | Attending: Radiation Oncology | Admitting: Radiation Oncology

## 2018-09-18 DIAGNOSIS — I5030 Unspecified diastolic (congestive) heart failure: Secondary | ICD-10-CM | POA: Diagnosis not present

## 2018-09-18 DIAGNOSIS — R278 Other lack of coordination: Secondary | ICD-10-CM | POA: Diagnosis not present

## 2018-09-18 DIAGNOSIS — Z510 Encounter for antineoplastic radiation therapy: Secondary | ICD-10-CM | POA: Diagnosis not present

## 2018-09-18 DIAGNOSIS — R2689 Other abnormalities of gait and mobility: Secondary | ICD-10-CM | POA: Diagnosis not present

## 2018-09-18 DIAGNOSIS — M6281 Muscle weakness (generalized): Secondary | ICD-10-CM | POA: Diagnosis not present

## 2018-09-18 DIAGNOSIS — C4401 Basal cell carcinoma of skin of lip: Secondary | ICD-10-CM | POA: Diagnosis not present

## 2018-09-18 DIAGNOSIS — J9621 Acute and chronic respiratory failure with hypoxia: Secondary | ICD-10-CM | POA: Diagnosis not present

## 2018-09-18 DIAGNOSIS — F329 Major depressive disorder, single episode, unspecified: Secondary | ICD-10-CM | POA: Diagnosis not present

## 2018-09-19 ENCOUNTER — Ambulatory Visit
Admission: RE | Admit: 2018-09-19 | Discharge: 2018-09-19 | Disposition: A | Discharge status: Home / Self Care | Payer: Medicare Other | Source: Ambulatory Visit | Attending: Radiation Oncology | Admitting: Radiation Oncology

## 2018-09-19 DIAGNOSIS — R278 Other lack of coordination: Secondary | ICD-10-CM | POA: Diagnosis not present

## 2018-09-19 DIAGNOSIS — Z510 Encounter for antineoplastic radiation therapy: Secondary | ICD-10-CM | POA: Diagnosis not present

## 2018-09-19 DIAGNOSIS — I5030 Unspecified diastolic (congestive) heart failure: Secondary | ICD-10-CM | POA: Diagnosis not present

## 2018-09-19 DIAGNOSIS — M6281 Muscle weakness (generalized): Secondary | ICD-10-CM | POA: Diagnosis not present

## 2018-09-19 DIAGNOSIS — R2689 Other abnormalities of gait and mobility: Secondary | ICD-10-CM | POA: Diagnosis not present

## 2018-09-19 DIAGNOSIS — F329 Major depressive disorder, single episode, unspecified: Secondary | ICD-10-CM | POA: Diagnosis not present

## 2018-09-19 DIAGNOSIS — J9621 Acute and chronic respiratory failure with hypoxia: Secondary | ICD-10-CM | POA: Diagnosis not present

## 2018-09-19 DIAGNOSIS — C4401 Basal cell carcinoma of skin of lip: Secondary | ICD-10-CM | POA: Diagnosis not present

## 2018-09-21 DIAGNOSIS — I5030 Unspecified diastolic (congestive) heart failure: Secondary | ICD-10-CM | POA: Diagnosis not present

## 2018-09-21 DIAGNOSIS — F329 Major depressive disorder, single episode, unspecified: Secondary | ICD-10-CM | POA: Diagnosis not present

## 2018-09-21 DIAGNOSIS — M6281 Muscle weakness (generalized): Secondary | ICD-10-CM | POA: Diagnosis not present

## 2018-09-21 DIAGNOSIS — R2689 Other abnormalities of gait and mobility: Secondary | ICD-10-CM | POA: Diagnosis not present

## 2018-09-21 DIAGNOSIS — R278 Other lack of coordination: Secondary | ICD-10-CM | POA: Diagnosis not present

## 2018-09-21 DIAGNOSIS — J9621 Acute and chronic respiratory failure with hypoxia: Secondary | ICD-10-CM | POA: Diagnosis not present

## 2018-09-22 DIAGNOSIS — M6281 Muscle weakness (generalized): Secondary | ICD-10-CM | POA: Diagnosis not present

## 2018-09-22 DIAGNOSIS — R2689 Other abnormalities of gait and mobility: Secondary | ICD-10-CM | POA: Diagnosis not present

## 2018-09-22 DIAGNOSIS — J9621 Acute and chronic respiratory failure with hypoxia: Secondary | ICD-10-CM | POA: Diagnosis not present

## 2018-09-22 DIAGNOSIS — I5030 Unspecified diastolic (congestive) heart failure: Secondary | ICD-10-CM | POA: Diagnosis not present

## 2018-09-22 DIAGNOSIS — F329 Major depressive disorder, single episode, unspecified: Secondary | ICD-10-CM | POA: Diagnosis not present

## 2018-09-22 DIAGNOSIS — R278 Other lack of coordination: Secondary | ICD-10-CM | POA: Diagnosis not present

## 2018-09-23 DIAGNOSIS — R278 Other lack of coordination: Secondary | ICD-10-CM | POA: Diagnosis not present

## 2018-09-23 DIAGNOSIS — I5030 Unspecified diastolic (congestive) heart failure: Secondary | ICD-10-CM | POA: Diagnosis not present

## 2018-09-23 DIAGNOSIS — R2689 Other abnormalities of gait and mobility: Secondary | ICD-10-CM | POA: Diagnosis not present

## 2018-09-23 DIAGNOSIS — F329 Major depressive disorder, single episode, unspecified: Secondary | ICD-10-CM | POA: Diagnosis not present

## 2018-09-23 DIAGNOSIS — J9621 Acute and chronic respiratory failure with hypoxia: Secondary | ICD-10-CM | POA: Diagnosis not present

## 2018-09-23 DIAGNOSIS — M6281 Muscle weakness (generalized): Secondary | ICD-10-CM | POA: Diagnosis not present

## 2018-09-25 DIAGNOSIS — M6281 Muscle weakness (generalized): Secondary | ICD-10-CM | POA: Diagnosis not present

## 2018-09-25 DIAGNOSIS — R278 Other lack of coordination: Secondary | ICD-10-CM | POA: Diagnosis not present

## 2018-09-25 DIAGNOSIS — R2689 Other abnormalities of gait and mobility: Secondary | ICD-10-CM | POA: Diagnosis not present

## 2018-09-25 DIAGNOSIS — F329 Major depressive disorder, single episode, unspecified: Secondary | ICD-10-CM | POA: Diagnosis not present

## 2018-09-25 DIAGNOSIS — I5030 Unspecified diastolic (congestive) heart failure: Secondary | ICD-10-CM | POA: Diagnosis not present

## 2018-09-25 DIAGNOSIS — J9621 Acute and chronic respiratory failure with hypoxia: Secondary | ICD-10-CM | POA: Diagnosis not present

## 2018-09-26 DIAGNOSIS — J9621 Acute and chronic respiratory failure with hypoxia: Secondary | ICD-10-CM | POA: Diagnosis not present

## 2018-09-26 DIAGNOSIS — M6281 Muscle weakness (generalized): Secondary | ICD-10-CM | POA: Diagnosis not present

## 2018-09-26 DIAGNOSIS — R2689 Other abnormalities of gait and mobility: Secondary | ICD-10-CM | POA: Diagnosis not present

## 2018-09-26 DIAGNOSIS — F329 Major depressive disorder, single episode, unspecified: Secondary | ICD-10-CM | POA: Diagnosis not present

## 2018-09-26 DIAGNOSIS — R278 Other lack of coordination: Secondary | ICD-10-CM | POA: Diagnosis not present

## 2018-09-26 DIAGNOSIS — I5030 Unspecified diastolic (congestive) heart failure: Secondary | ICD-10-CM | POA: Diagnosis not present

## 2018-09-27 DIAGNOSIS — J9621 Acute and chronic respiratory failure with hypoxia: Secondary | ICD-10-CM | POA: Diagnosis not present

## 2018-09-27 DIAGNOSIS — I5030 Unspecified diastolic (congestive) heart failure: Secondary | ICD-10-CM | POA: Diagnosis not present

## 2018-09-27 DIAGNOSIS — M6281 Muscle weakness (generalized): Secondary | ICD-10-CM | POA: Diagnosis not present

## 2018-09-27 DIAGNOSIS — R278 Other lack of coordination: Secondary | ICD-10-CM | POA: Diagnosis not present

## 2018-09-27 DIAGNOSIS — F329 Major depressive disorder, single episode, unspecified: Secondary | ICD-10-CM | POA: Diagnosis not present

## 2018-09-27 DIAGNOSIS — R2689 Other abnormalities of gait and mobility: Secondary | ICD-10-CM | POA: Diagnosis not present

## 2018-09-28 DIAGNOSIS — F329 Major depressive disorder, single episode, unspecified: Secondary | ICD-10-CM | POA: Diagnosis not present

## 2018-09-28 DIAGNOSIS — R2689 Other abnormalities of gait and mobility: Secondary | ICD-10-CM | POA: Diagnosis not present

## 2018-09-28 DIAGNOSIS — R278 Other lack of coordination: Secondary | ICD-10-CM | POA: Diagnosis not present

## 2018-09-28 DIAGNOSIS — J9621 Acute and chronic respiratory failure with hypoxia: Secondary | ICD-10-CM | POA: Diagnosis not present

## 2018-09-28 DIAGNOSIS — M6281 Muscle weakness (generalized): Secondary | ICD-10-CM | POA: Diagnosis not present

## 2018-09-28 DIAGNOSIS — I5030 Unspecified diastolic (congestive) heart failure: Secondary | ICD-10-CM | POA: Diagnosis not present

## 2018-10-04 DIAGNOSIS — F329 Major depressive disorder, single episode, unspecified: Secondary | ICD-10-CM | POA: Diagnosis not present

## 2018-10-04 DIAGNOSIS — R278 Other lack of coordination: Secondary | ICD-10-CM | POA: Diagnosis not present

## 2018-10-04 DIAGNOSIS — M6281 Muscle weakness (generalized): Secondary | ICD-10-CM | POA: Diagnosis not present

## 2018-10-04 DIAGNOSIS — J9621 Acute and chronic respiratory failure with hypoxia: Secondary | ICD-10-CM | POA: Diagnosis not present

## 2018-10-04 DIAGNOSIS — I5030 Unspecified diastolic (congestive) heart failure: Secondary | ICD-10-CM | POA: Diagnosis not present

## 2018-10-04 DIAGNOSIS — R2689 Other abnormalities of gait and mobility: Secondary | ICD-10-CM | POA: Diagnosis not present

## 2018-10-06 DIAGNOSIS — M6281 Muscle weakness (generalized): Secondary | ICD-10-CM | POA: Diagnosis not present

## 2018-10-06 DIAGNOSIS — R278 Other lack of coordination: Secondary | ICD-10-CM | POA: Diagnosis not present

## 2018-10-06 DIAGNOSIS — J9621 Acute and chronic respiratory failure with hypoxia: Secondary | ICD-10-CM | POA: Diagnosis not present

## 2018-10-06 DIAGNOSIS — F329 Major depressive disorder, single episode, unspecified: Secondary | ICD-10-CM | POA: Diagnosis not present

## 2018-10-06 DIAGNOSIS — R2689 Other abnormalities of gait and mobility: Secondary | ICD-10-CM | POA: Diagnosis not present

## 2018-10-06 DIAGNOSIS — I5030 Unspecified diastolic (congestive) heart failure: Secondary | ICD-10-CM | POA: Diagnosis not present

## 2018-10-09 DIAGNOSIS — R2689 Other abnormalities of gait and mobility: Secondary | ICD-10-CM | POA: Diagnosis not present

## 2018-10-09 DIAGNOSIS — M6281 Muscle weakness (generalized): Secondary | ICD-10-CM | POA: Diagnosis not present

## 2018-10-09 DIAGNOSIS — I5030 Unspecified diastolic (congestive) heart failure: Secondary | ICD-10-CM | POA: Diagnosis not present

## 2018-10-09 DIAGNOSIS — Z741 Need for assistance with personal care: Secondary | ICD-10-CM | POA: Diagnosis not present

## 2018-10-09 DIAGNOSIS — R4189 Other symptoms and signs involving cognitive functions and awareness: Secondary | ICD-10-CM | POA: Diagnosis not present

## 2018-10-09 DIAGNOSIS — R278 Other lack of coordination: Secondary | ICD-10-CM | POA: Diagnosis not present

## 2018-10-10 DIAGNOSIS — R278 Other lack of coordination: Secondary | ICD-10-CM | POA: Diagnosis not present

## 2018-10-10 DIAGNOSIS — Z741 Need for assistance with personal care: Secondary | ICD-10-CM | POA: Diagnosis not present

## 2018-10-10 DIAGNOSIS — I5030 Unspecified diastolic (congestive) heart failure: Secondary | ICD-10-CM | POA: Diagnosis not present

## 2018-10-10 DIAGNOSIS — R2689 Other abnormalities of gait and mobility: Secondary | ICD-10-CM | POA: Diagnosis not present

## 2018-10-10 DIAGNOSIS — R4189 Other symptoms and signs involving cognitive functions and awareness: Secondary | ICD-10-CM | POA: Diagnosis not present

## 2018-10-10 DIAGNOSIS — M6281 Muscle weakness (generalized): Secondary | ICD-10-CM | POA: Diagnosis not present

## 2018-10-11 DIAGNOSIS — R278 Other lack of coordination: Secondary | ICD-10-CM | POA: Diagnosis not present

## 2018-10-11 DIAGNOSIS — R4189 Other symptoms and signs involving cognitive functions and awareness: Secondary | ICD-10-CM | POA: Diagnosis not present

## 2018-10-11 DIAGNOSIS — I5030 Unspecified diastolic (congestive) heart failure: Secondary | ICD-10-CM | POA: Diagnosis not present

## 2018-10-11 DIAGNOSIS — Z741 Need for assistance with personal care: Secondary | ICD-10-CM | POA: Diagnosis not present

## 2018-10-11 DIAGNOSIS — R2689 Other abnormalities of gait and mobility: Secondary | ICD-10-CM | POA: Diagnosis not present

## 2018-10-11 DIAGNOSIS — M6281 Muscle weakness (generalized): Secondary | ICD-10-CM | POA: Diagnosis not present

## 2018-10-12 DIAGNOSIS — Z741 Need for assistance with personal care: Secondary | ICD-10-CM | POA: Diagnosis not present

## 2018-10-12 DIAGNOSIS — R4189 Other symptoms and signs involving cognitive functions and awareness: Secondary | ICD-10-CM | POA: Diagnosis not present

## 2018-10-12 DIAGNOSIS — I5030 Unspecified diastolic (congestive) heart failure: Secondary | ICD-10-CM | POA: Diagnosis not present

## 2018-10-12 DIAGNOSIS — R2689 Other abnormalities of gait and mobility: Secondary | ICD-10-CM | POA: Diagnosis not present

## 2018-10-12 DIAGNOSIS — M6281 Muscle weakness (generalized): Secondary | ICD-10-CM | POA: Diagnosis not present

## 2018-10-12 DIAGNOSIS — R278 Other lack of coordination: Secondary | ICD-10-CM | POA: Diagnosis not present

## 2018-10-13 DIAGNOSIS — Z741 Need for assistance with personal care: Secondary | ICD-10-CM | POA: Diagnosis not present

## 2018-10-13 DIAGNOSIS — R278 Other lack of coordination: Secondary | ICD-10-CM | POA: Diagnosis not present

## 2018-10-13 DIAGNOSIS — R4189 Other symptoms and signs involving cognitive functions and awareness: Secondary | ICD-10-CM | POA: Diagnosis not present

## 2018-10-13 DIAGNOSIS — R2689 Other abnormalities of gait and mobility: Secondary | ICD-10-CM | POA: Diagnosis not present

## 2018-10-13 DIAGNOSIS — M6281 Muscle weakness (generalized): Secondary | ICD-10-CM | POA: Diagnosis not present

## 2018-10-13 DIAGNOSIS — I5030 Unspecified diastolic (congestive) heart failure: Secondary | ICD-10-CM | POA: Diagnosis not present

## 2018-10-16 DIAGNOSIS — Z741 Need for assistance with personal care: Secondary | ICD-10-CM | POA: Diagnosis not present

## 2018-10-16 DIAGNOSIS — M6281 Muscle weakness (generalized): Secondary | ICD-10-CM | POA: Diagnosis not present

## 2018-10-16 DIAGNOSIS — I5030 Unspecified diastolic (congestive) heart failure: Secondary | ICD-10-CM | POA: Diagnosis not present

## 2018-10-16 DIAGNOSIS — R4189 Other symptoms and signs involving cognitive functions and awareness: Secondary | ICD-10-CM | POA: Diagnosis not present

## 2018-10-16 DIAGNOSIS — R2689 Other abnormalities of gait and mobility: Secondary | ICD-10-CM | POA: Diagnosis not present

## 2018-10-16 DIAGNOSIS — R278 Other lack of coordination: Secondary | ICD-10-CM | POA: Diagnosis not present

## 2018-10-17 DIAGNOSIS — R2689 Other abnormalities of gait and mobility: Secondary | ICD-10-CM | POA: Diagnosis not present

## 2018-10-17 DIAGNOSIS — Z741 Need for assistance with personal care: Secondary | ICD-10-CM | POA: Diagnosis not present

## 2018-10-17 DIAGNOSIS — I5030 Unspecified diastolic (congestive) heart failure: Secondary | ICD-10-CM | POA: Diagnosis not present

## 2018-10-17 DIAGNOSIS — M6281 Muscle weakness (generalized): Secondary | ICD-10-CM | POA: Diagnosis not present

## 2018-10-17 DIAGNOSIS — R278 Other lack of coordination: Secondary | ICD-10-CM | POA: Diagnosis not present

## 2018-10-17 DIAGNOSIS — R4189 Other symptoms and signs involving cognitive functions and awareness: Secondary | ICD-10-CM | POA: Diagnosis not present

## 2018-10-19 DIAGNOSIS — R278 Other lack of coordination: Secondary | ICD-10-CM | POA: Diagnosis not present

## 2018-10-19 DIAGNOSIS — R4189 Other symptoms and signs involving cognitive functions and awareness: Secondary | ICD-10-CM | POA: Diagnosis not present

## 2018-10-19 DIAGNOSIS — I5030 Unspecified diastolic (congestive) heart failure: Secondary | ICD-10-CM | POA: Diagnosis not present

## 2018-10-19 DIAGNOSIS — R2689 Other abnormalities of gait and mobility: Secondary | ICD-10-CM | POA: Diagnosis not present

## 2018-10-19 DIAGNOSIS — Z741 Need for assistance with personal care: Secondary | ICD-10-CM | POA: Diagnosis not present

## 2018-10-19 DIAGNOSIS — M6281 Muscle weakness (generalized): Secondary | ICD-10-CM | POA: Diagnosis not present

## 2018-10-21 DIAGNOSIS — I5030 Unspecified diastolic (congestive) heart failure: Secondary | ICD-10-CM | POA: Diagnosis not present

## 2018-10-21 DIAGNOSIS — R4189 Other symptoms and signs involving cognitive functions and awareness: Secondary | ICD-10-CM | POA: Diagnosis not present

## 2018-10-21 DIAGNOSIS — M6281 Muscle weakness (generalized): Secondary | ICD-10-CM | POA: Diagnosis not present

## 2018-10-21 DIAGNOSIS — Z741 Need for assistance with personal care: Secondary | ICD-10-CM | POA: Diagnosis not present

## 2018-10-21 DIAGNOSIS — R278 Other lack of coordination: Secondary | ICD-10-CM | POA: Diagnosis not present

## 2018-10-21 DIAGNOSIS — R2689 Other abnormalities of gait and mobility: Secondary | ICD-10-CM | POA: Diagnosis not present

## 2018-10-23 DIAGNOSIS — R2689 Other abnormalities of gait and mobility: Secondary | ICD-10-CM | POA: Diagnosis not present

## 2018-10-23 DIAGNOSIS — R278 Other lack of coordination: Secondary | ICD-10-CM | POA: Diagnosis not present

## 2018-10-23 DIAGNOSIS — I5030 Unspecified diastolic (congestive) heart failure: Secondary | ICD-10-CM | POA: Diagnosis not present

## 2018-10-23 DIAGNOSIS — R4189 Other symptoms and signs involving cognitive functions and awareness: Secondary | ICD-10-CM | POA: Diagnosis not present

## 2018-10-23 DIAGNOSIS — M6281 Muscle weakness (generalized): Secondary | ICD-10-CM | POA: Diagnosis not present

## 2018-10-23 DIAGNOSIS — Z741 Need for assistance with personal care: Secondary | ICD-10-CM | POA: Diagnosis not present

## 2018-10-24 DIAGNOSIS — E782 Mixed hyperlipidemia: Secondary | ICD-10-CM | POA: Diagnosis not present

## 2018-10-24 DIAGNOSIS — R278 Other lack of coordination: Secondary | ICD-10-CM | POA: Diagnosis not present

## 2018-10-24 DIAGNOSIS — R2689 Other abnormalities of gait and mobility: Secondary | ICD-10-CM | POA: Diagnosis not present

## 2018-10-24 DIAGNOSIS — I10 Essential (primary) hypertension: Secondary | ICD-10-CM | POA: Diagnosis not present

## 2018-10-24 DIAGNOSIS — R001 Bradycardia, unspecified: Secondary | ICD-10-CM | POA: Diagnosis not present

## 2018-10-24 DIAGNOSIS — I4891 Unspecified atrial fibrillation: Secondary | ICD-10-CM | POA: Diagnosis not present

## 2018-10-24 DIAGNOSIS — R4189 Other symptoms and signs involving cognitive functions and awareness: Secondary | ICD-10-CM | POA: Diagnosis not present

## 2018-10-24 DIAGNOSIS — I5030 Unspecified diastolic (congestive) heart failure: Secondary | ICD-10-CM | POA: Diagnosis not present

## 2018-10-24 DIAGNOSIS — Z741 Need for assistance with personal care: Secondary | ICD-10-CM | POA: Diagnosis not present

## 2018-10-24 DIAGNOSIS — M6281 Muscle weakness (generalized): Secondary | ICD-10-CM | POA: Diagnosis not present

## 2018-10-26 ENCOUNTER — Other Ambulatory Visit: Payer: Self-pay

## 2018-10-26 ENCOUNTER — Ambulatory Visit
Admission: RE | Admit: 2018-10-26 | Discharge: 2018-10-26 | Disposition: A | Discharge status: Home / Self Care | Payer: Medicare Other | Source: Ambulatory Visit | Attending: Radiation Oncology | Admitting: Radiation Oncology

## 2018-10-26 ENCOUNTER — Encounter: Payer: Self-pay | Admitting: Radiation Oncology

## 2018-10-26 VITALS — BP 110/64 | HR 68 | Temp 97.9°F | Resp 20

## 2018-10-26 DIAGNOSIS — C4401 Basal cell carcinoma of skin of lip: Secondary | ICD-10-CM

## 2018-10-26 DIAGNOSIS — D649 Anemia, unspecified: Secondary | ICD-10-CM | POA: Diagnosis not present

## 2018-10-26 NOTE — Progress Notes (Signed)
Radiation Oncology Follow up Note  Name: Victoria Contreras   Date:   10/26/2018 MRN:  [id number] DOB: [birth year]    This 85 y.o. female presents to the clinic today for one-month follow-up status post electron beam therapy to the upper lip for invasive basal cell carcinoma.  REFERRING PROVIDER: [name], MD  HPI: patient is a 85 year old female now out 1 month having completed electron beam therapy to her mid upper lip. Seen today received 5 she is doing well she's had a complete response in that area with only some minor scarring still present. She specifically denies any oral pain or discomfort.  COMPLICATIONS OF TREATMENT: none  FOLLOW UP COMPLIANCE: keeps appointments   PHYSICAL EXAM:  BP 110/64   Pulse 68   Temp 97.9 F (36.6 C)   Resp 20  Mid upper lip shows slight scar tissue in indentation no evidence of active disease noted oral cavity is clear. No evidence of cervical or supraclavicular adenopathy.Well-developed well-nourished patient in NAD. HEENT reveals PERLA, EOMI, discs not visualized.  Oral cavity is clear. No oral mucosal lesions are identified. Neck is clear without evidence of cervical or supraclavicular adenopathy. Lungs are clear to A&P. Cardiac examination is essentially unremarkable with regular rate and rhythm without murmur rub or thrill. Abdomen is benign with no organomegaly or masses noted. Motor sensory and DTR levels are equal and symmetric in the upper and lower extremities. Cranial nerves II through XII are grossly intact. Proprioception is intact. No peripheral adenopathy or edema is identified. No motor or sensory levels are noted. Crude visual fields are within normal range.  RADIOLOGY RESULTS: no current films to review  PLAN: he present time patient will complete response electron beam therapy. I'm please were overall progress. I'll see her back in 4-5 months and then turn follow-up care over to dermatology. Patient family know to call with any  concerns at any time.  I would like to take this opportunity to thank you for allowing me to participate in the care of your patient.Noreene Filbert, MD

## 2018-10-30 DIAGNOSIS — I5032 Chronic diastolic (congestive) heart failure: Secondary | ICD-10-CM | POA: Diagnosis not present

## 2018-10-30 DIAGNOSIS — F39 Unspecified mood [affective] disorder: Secondary | ICD-10-CM | POA: Diagnosis not present

## 2018-10-30 DIAGNOSIS — K219 Gastro-esophageal reflux disease without esophagitis: Secondary | ICD-10-CM | POA: Diagnosis not present

## 2018-10-30 DIAGNOSIS — C9110 Chronic lymphocytic leukemia of B-cell type not having achieved remission: Secondary | ICD-10-CM | POA: Diagnosis not present

## 2018-10-30 DIAGNOSIS — I480 Paroxysmal atrial fibrillation: Secondary | ICD-10-CM | POA: Diagnosis not present

## 2018-11-20 DIAGNOSIS — J069 Acute upper respiratory infection, unspecified: Secondary | ICD-10-CM | POA: Diagnosis not present

## 2018-11-28 DIAGNOSIS — K3189 Other diseases of stomach and duodenum: Secondary | ICD-10-CM | POA: Diagnosis not present

## 2018-11-29 DIAGNOSIS — N390 Urinary tract infection, site not specified: Secondary | ICD-10-CM | POA: Diagnosis not present

## 2018-12-22 DIAGNOSIS — K219 Gastro-esophageal reflux disease without esophagitis: Secondary | ICD-10-CM | POA: Diagnosis not present

## 2018-12-22 DIAGNOSIS — F341 Dysthymic disorder: Secondary | ICD-10-CM

## 2018-12-22 DIAGNOSIS — I4891 Unspecified atrial fibrillation: Secondary | ICD-10-CM | POA: Diagnosis not present

## 2018-12-22 DIAGNOSIS — I5030 Unspecified diastolic (congestive) heart failure: Secondary | ICD-10-CM | POA: Diagnosis not present

## 2018-12-22 DIAGNOSIS — C9100 Acute lymphoblastic leukemia not having achieved remission: Secondary | ICD-10-CM | POA: Diagnosis not present

## 2018-12-22 DIAGNOSIS — M1000 Idiopathic gout, unspecified site: Secondary | ICD-10-CM | POA: Diagnosis not present

## 2019-01-11 DIAGNOSIS — I10 Essential (primary) hypertension: Secondary | ICD-10-CM | POA: Diagnosis not present

## 2019-01-25 DIAGNOSIS — E876 Hypokalemia: Secondary | ICD-10-CM | POA: Diagnosis not present

## 2019-02-19 DIAGNOSIS — F39 Unspecified mood [affective] disorder: Secondary | ICD-10-CM

## 2019-02-19 DIAGNOSIS — I480 Paroxysmal atrial fibrillation: Secondary | ICD-10-CM

## 2019-02-19 DIAGNOSIS — I5032 Chronic diastolic (congestive) heart failure: Secondary | ICD-10-CM

## 2019-02-19 DIAGNOSIS — C9110 Chronic lymphocytic leukemia of B-cell type not having achieved remission: Secondary | ICD-10-CM

## 2019-03-30 DIAGNOSIS — Z03818 Encounter for observation for suspected exposure to other biological agents ruled out: Secondary | ICD-10-CM | POA: Diagnosis not present

## 2019-04-04 DIAGNOSIS — F329 Major depressive disorder, single episode, unspecified: Secondary | ICD-10-CM | POA: Diagnosis not present

## 2019-04-26 DIAGNOSIS — E782 Mixed hyperlipidemia: Secondary | ICD-10-CM | POA: Diagnosis not present

## 2019-04-26 DIAGNOSIS — I4891 Unspecified atrial fibrillation: Secondary | ICD-10-CM | POA: Diagnosis not present

## 2019-04-26 DIAGNOSIS — I340 Nonrheumatic mitral (valve) insufficiency: Secondary | ICD-10-CM | POA: Diagnosis not present

## 2019-04-26 DIAGNOSIS — I10 Essential (primary) hypertension: Secondary | ICD-10-CM | POA: Diagnosis not present

## 2019-04-27 DIAGNOSIS — C9110 Chronic lymphocytic leukemia of B-cell type not having achieved remission: Secondary | ICD-10-CM | POA: Diagnosis not present

## 2019-04-27 DIAGNOSIS — K219 Gastro-esophageal reflux disease without esophagitis: Secondary | ICD-10-CM | POA: Diagnosis not present

## 2019-04-27 DIAGNOSIS — F39 Unspecified mood [affective] disorder: Secondary | ICD-10-CM

## 2019-04-27 DIAGNOSIS — M1990 Unspecified osteoarthritis, unspecified site: Secondary | ICD-10-CM

## 2019-04-27 DIAGNOSIS — I5030 Unspecified diastolic (congestive) heart failure: Secondary | ICD-10-CM

## 2019-04-27 DIAGNOSIS — I4891 Unspecified atrial fibrillation: Secondary | ICD-10-CM

## 2019-04-27 DIAGNOSIS — M1000 Idiopathic gout, unspecified site: Secondary | ICD-10-CM

## 2019-05-04 ENCOUNTER — Other Ambulatory Visit: Payer: Self-pay

## 2019-05-07 ENCOUNTER — Other Ambulatory Visit: Payer: Self-pay

## 2019-05-07 ENCOUNTER — Ambulatory Visit
Admission: RE | Admit: 2019-05-07 | Discharge: 2019-05-07 | Disposition: A | Discharge status: Home / Self Care | Payer: Medicare Other | Source: Ambulatory Visit | Attending: Radiation Oncology | Admitting: Radiation Oncology

## 2019-05-07 ENCOUNTER — Encounter: Payer: Self-pay | Admitting: Radiation Oncology

## 2019-05-07 VITALS — BP 121/68 | HR 121 | Temp 98.3°F | Resp 16 | Wt 191.5 lb

## 2019-05-07 DIAGNOSIS — Z923 Personal history of irradiation: Secondary | ICD-10-CM | POA: Diagnosis not present

## 2019-05-07 DIAGNOSIS — C4401 Basal cell carcinoma of skin of lip: Secondary | ICD-10-CM

## 2019-05-07 DIAGNOSIS — Z85828 Personal history of other malignant neoplasm of skin: Secondary | ICD-10-CM | POA: Diagnosis not present

## 2019-05-07 NOTE — Progress Notes (Signed)
Radiation Oncology Follow up Note  Name: Victoria Contreras   Date:   05/07/2019 MRN:  [id number] DOB: [birth year]    This 85 y.o. female presents to the clinic today for 51-month follow-up status post electron-beam therapy to upper lip for invasive babies basal cell carcinoma.  REFERRING PROVIDER: [name], MD  HPI: Patient is a 85 year old female now about 6 months having completed electron-beam radiation therapy to her upper lip for invasive a basal cell carcinoma.  Seen today in routine follow-up she is doing well.  She has no evidence of disease.  She is without complaint.  She is having no problems with oral mucositis or any dysphasia..  COMPLICATIONS OF TREATMENT: none  FOLLOW UP COMPLIANCE: keeps appointments   PHYSICAL EXAM:  BP 121/68 (BP Location: Left Arm, Patient Position: Sitting)   Pulse (!) 121   Temp 98.3 F (36.8 C) (Tympanic)   Resp 16   Wt 191 lb 8 oz (86.9 kg)   BMI 28.28 kg/m  No evidence of disease in the upper lip region.  No evidence of cervical or supraclavicular adenopathy.  I see no other visible lesions at this time of her skin.  Well-developed well-nourished patient in NAD. HEENT reveals PERLA, EOMI, discs not visualized.  Oral cavity is clear. No oral mucosal lesions are identified. Neck is clear without evidence of cervical or supraclavicular adenopathy. Lungs are clear to A&P. Cardiac examination is essentially unremarkable with regular rate and rhythm without murmur rub or thrill. Abdomen is benign with no organomegaly or masses noted. Motor sensory and DTR levels are equal and symmetric in the upper and lower extremities. Cranial nerves II through XII are grossly intact. Proprioception is intact. No peripheral adenopathy or edema is identified. No motor or sensory levels are noted. Crude visual fields are within normal range.  RADIOLOGY RESULTS: No current films to review  PLAN: Present time will turn follow-up care over to her dermatologist.  I be  happy to reevaluate her in the future should any further lesions need attention.  Patient knows to call with any concerns.  I would like to take this opportunity to thank you for allowing me to participate in the care of your patient.Noreene Filbert, MD

## 2019-05-10 DIAGNOSIS — Z03818 Encounter for observation for suspected exposure to other biological agents ruled out: Secondary | ICD-10-CM | POA: Diagnosis not present

## 2019-05-16 DIAGNOSIS — Z03818 Encounter for observation for suspected exposure to other biological agents ruled out: Secondary | ICD-10-CM | POA: Diagnosis not present

## 2019-06-08 DIAGNOSIS — Z03818 Encounter for observation for suspected exposure to other biological agents ruled out: Secondary | ICD-10-CM | POA: Diagnosis not present

## 2019-06-19 DIAGNOSIS — Z03818 Encounter for observation for suspected exposure to other biological agents ruled out: Secondary | ICD-10-CM | POA: Diagnosis not present

## 2019-06-26 DIAGNOSIS — J9601 Acute respiratory failure with hypoxia: Secondary | ICD-10-CM | POA: Diagnosis not present

## 2019-06-26 DIAGNOSIS — U071 COVID-19: Secondary | ICD-10-CM | POA: Diagnosis not present

## 2019-07-08 DEATH — deceased
